# Patient Record
Sex: Female | Born: 1950
Health system: Southern US, Community
[De-identification: ages and names within clinical notes are randomized; demographics above are authoritative.]

## PROBLEM LIST (undated history)

## (undated) DIAGNOSIS — R011 Cardiac murmur, unspecified: Secondary | ICD-10-CM

## (undated) DIAGNOSIS — G8929 Other chronic pain: Secondary | ICD-10-CM

## (undated) DIAGNOSIS — M545 Low back pain, unspecified: Secondary | ICD-10-CM

## (undated) HISTORY — PX: COLONOSCOPY W/ BIOPSIES: SHX1374

## (undated) HISTORY — PX: POLYPECTOMY: SHX149

## (undated) HISTORY — PX: KNEE SURGERY: SHX244

## (undated) HISTORY — DX: Low back pain: M54.5

## (undated) HISTORY — PX: COLONOSCOPY: SHX174

## (undated) HISTORY — PX: ABLATION: SHX5711

## (undated) HISTORY — DX: Cardiac murmur, unspecified: R01.1

## (undated) HISTORY — DX: Low back pain, unspecified: M54.50

## (undated) HISTORY — DX: Other chronic pain: G89.29

---

## 1998-08-26 ENCOUNTER — Encounter: Admission: RE | Admit: 1998-08-26 | Discharge: 1998-08-26 | Payer: Self-pay | Admitting: Sports Medicine

## 1998-09-11 ENCOUNTER — Encounter: Admission: RE | Admit: 1998-09-11 | Discharge: 1998-09-11 | Payer: Self-pay | Admitting: Family Medicine

## 1999-10-06 ENCOUNTER — Encounter: Admission: RE | Admit: 1999-10-06 | Discharge: 1999-10-06 | Payer: Self-pay | Admitting: Sports Medicine

## 2001-02-22 ENCOUNTER — Encounter: Admission: RE | Admit: 2001-02-22 | Discharge: 2001-02-22 | Payer: Self-pay | Admitting: Sports Medicine

## 2001-04-28 ENCOUNTER — Other Ambulatory Visit: Admission: RE | Admit: 2001-04-28 | Discharge: 2001-04-28 | Payer: Self-pay | Admitting: *Deleted

## 2001-06-21 ENCOUNTER — Encounter: Admission: RE | Admit: 2001-06-21 | Discharge: 2001-06-21 | Payer: Self-pay | Admitting: Sports Medicine

## 2001-10-18 ENCOUNTER — Encounter: Admission: RE | Admit: 2001-10-18 | Discharge: 2001-10-18 | Payer: Self-pay | Admitting: Family Medicine

## 2001-10-28 ENCOUNTER — Encounter: Admission: RE | Admit: 2001-10-28 | Discharge: 2001-10-28 | Payer: Self-pay | Admitting: Sports Medicine

## 2002-05-19 ENCOUNTER — Other Ambulatory Visit: Admission: RE | Admit: 2002-05-19 | Discharge: 2002-05-19 | Payer: Self-pay | Admitting: Family Medicine

## 2003-02-16 ENCOUNTER — Encounter: Admission: RE | Admit: 2003-02-16 | Discharge: 2003-02-16 | Payer: Self-pay | Admitting: Sports Medicine

## 2003-03-30 ENCOUNTER — Encounter: Admission: RE | Admit: 2003-03-30 | Discharge: 2003-03-30 | Payer: Self-pay | Admitting: Sports Medicine

## 2003-07-10 ENCOUNTER — Other Ambulatory Visit: Admission: RE | Admit: 2003-07-10 | Discharge: 2003-07-10 | Payer: Self-pay | Admitting: Family Medicine

## 2003-10-22 ENCOUNTER — Ambulatory Visit (HOSPITAL_COMMUNITY): Admission: RE | Admit: 2003-10-22 | Discharge: 2003-10-22 | Payer: Self-pay | Admitting: Gastroenterology

## 2004-02-29 ENCOUNTER — Ambulatory Visit: Payer: Self-pay | Admitting: Sports Medicine

## 2004-12-02 ENCOUNTER — Ambulatory Visit: Payer: Self-pay | Admitting: Sports Medicine

## 2005-01-08 ENCOUNTER — Ambulatory Visit: Payer: Self-pay | Admitting: Sports Medicine

## 2005-04-10 ENCOUNTER — Ambulatory Visit: Payer: Self-pay | Admitting: Sports Medicine

## 2005-07-15 ENCOUNTER — Encounter: Admission: RE | Admit: 2005-07-15 | Discharge: 2005-07-15 | Payer: Self-pay | Admitting: General Surgery

## 2005-08-26 ENCOUNTER — Encounter: Admission: RE | Admit: 2005-08-26 | Discharge: 2005-08-26 | Payer: Self-pay | Admitting: General Surgery

## 2005-11-03 ENCOUNTER — Ambulatory Visit: Payer: Self-pay | Admitting: Sports Medicine

## 2006-01-06 ENCOUNTER — Encounter: Admission: RE | Admit: 2006-01-06 | Discharge: 2006-01-06 | Payer: Self-pay | Admitting: Family Medicine

## 2006-12-23 ENCOUNTER — Ambulatory Visit: Payer: Self-pay | Admitting: Sports Medicine

## 2006-12-23 DIAGNOSIS — IMO0002 Reserved for concepts with insufficient information to code with codable children: Secondary | ICD-10-CM

## 2006-12-23 DIAGNOSIS — D1800 Hemangioma unspecified site: Secondary | ICD-10-CM | POA: Insufficient documentation

## 2006-12-23 DIAGNOSIS — G4700 Insomnia, unspecified: Secondary | ICD-10-CM

## 2007-01-10 ENCOUNTER — Encounter: Admission: RE | Admit: 2007-01-10 | Discharge: 2007-01-10 | Payer: Self-pay | Admitting: Family Medicine

## 2008-01-12 ENCOUNTER — Encounter: Admission: RE | Admit: 2008-01-12 | Discharge: 2008-01-12 | Payer: Self-pay | Admitting: Family Medicine

## 2009-01-14 ENCOUNTER — Encounter: Admission: RE | Admit: 2009-01-14 | Discharge: 2009-01-14 | Payer: Self-pay | Admitting: Internal Medicine

## 2009-02-21 ENCOUNTER — Ambulatory Visit: Payer: Self-pay | Admitting: Sports Medicine

## 2009-02-21 DIAGNOSIS — S46819A Strain of other muscles, fascia and tendons at shoulder and upper arm level, unspecified arm, initial encounter: Secondary | ICD-10-CM

## 2009-04-04 ENCOUNTER — Ambulatory Visit: Payer: Self-pay | Admitting: Sports Medicine

## 2009-10-18 ENCOUNTER — Ambulatory Visit: Payer: Self-pay | Admitting: Sports Medicine

## 2009-10-18 DIAGNOSIS — M25559 Pain in unspecified hip: Secondary | ICD-10-CM | POA: Insufficient documentation

## 2009-10-18 DIAGNOSIS — M76899 Other specified enthesopathies of unspecified lower limb, excluding foot: Secondary | ICD-10-CM | POA: Insufficient documentation

## 2010-01-15 ENCOUNTER — Encounter: Admission: RE | Admit: 2010-01-15 | Discharge: 2010-01-15 | Payer: Self-pay | Admitting: Internal Medicine

## 2010-07-17 NOTE — Assessment & Plan Note (Signed)
Summary: BURSITIS OF THE HIP/MJD   Vital Signs:  Patient profile:   60 year old female BP sitting:   113 / 71  Vitals Entered By: Lillia Pauls CMA (Oct 18, 2009 9:51 AM)  History of Present Illness: 1 mo of RT hip area tightness biking a lot did great last year some sxs all winter but worse past mo - ? whether yoga poses bothered this with quad tightness  wakes her at night has to stretch does not hurt into groin  no running does lot of walking  feels better after yoga  Allergies (verified): 1)  ! Sulfa  Physical Exam  General:  Well-developed,well-nourished,in no acute distress; alert,appropriate and cooperative throughout examination Msk:  RT hip TTP over mid buttocks and piriformis TTP over glut med into upper troch midly wk on abduction and on hip rotation on RT norm ROM of hip joint RT and LT exc hip flex strength on RT Additional Exam:  MSK [place] There is a norm labrum Head of femur and femoral neck look normal no effusion bursal type swelling under glut medius tendon as it comes into sup Troch area this has inc doppler activity  images saved   Impression & Recommendations:  Problem # 1:  HIP PAIN, RIGHT (ICD-719.45)  Her updated medication list for this problem includes:    Diclofenac Sodium 75 Mg Tbec (Diclofenac sodium) .Marland Kitchen... 1 by mouth two times a day  use icing and NSAIDS can inject if more severe or meds not helping  Orders: [place] LIMITED ([id number])  Problem # 2:  TROCHANTERIC BURSITIS (ICD-726.5)  This is under glut medius tendon also some piriformis irritaiotn  HEP given stretches ck bike seat for cause  reck 4 wks  Orders: [place] LIMITED ([id number])  Complete Medication List: 1)  Amitriptyline Hcl 10 Mg Tabs (Amitriptyline hcl) .... Take 2 tabs at bedtime 2)  Amitriptyline Hcl 25 Mg Tabs (Amitriptyline hcl) .... Take 1 or 2 tablet by mouth at bedtime 3)  Diclofenac Sodium 75 Mg Tbec (Diclofenac sodium) .Marland Kitchen.. 1 by mouth  bid Prescriptions: DICLOFENAC SODIUM 75 MG TBEC (DICLOFENAC SODIUM) 1 by mouth bid  #50 x 2   Entered and Authorized by:   Enid Baas MD   Signed by:   Enid Baas MD on 10/18/2009   Method used:   Electronically to        [facility] # [phone]* (retail)       [address]       [place], [place]  [address]       Ph: [phone]       Fax: [phone]   RxID:   [id number]

## 2010-08-24 ENCOUNTER — Encounter: Payer: Self-pay | Admitting: *Deleted

## 2010-10-31 NOTE — Op Note (Signed)
NAMEKIMIKO, COMMON                          ACCOUNT NO.:  [id number]   MEDICAL RECORD NO.:  [id number]                   PATIENT TYPE:  AMB   LOCATION:  ENDO                                 FACILITY:  [facility]   PHYSICIAN:  Anselmo Rod, M.D.               DATE OF BIRTH:  [birth year]   DATE OF PROCEDURE:  10/22/2003  DATE OF DISCHARGE:                                 OPERATIVE REPORT   PROCEDURE PERFORMED:  Screening colonoscopy.   ENDOSCOPIST:  Charna Elizabeth, M.D.   INSTRUMENT USED:  Olympus video colonoscope.   INDICATIONS FOR PROCEDURE:  The patient is a 60 year old white female with a  family history of colon cancer undergoing screening colonoscopy.  Rule out  colonic polyps, masses, etc.   PREPROCEDURE PREPARATION:  Informed consent was procured from the patient.  The patient was fasted for eight hours prior to the procedure and prepped  with a bottle of magnesium citrate and a gallon of GoLYTELY the night prior  to the procedure.   PREPROCEDURE PHYSICAL:  The patient had stable vital signs.  Neck supple.  Chest clear to auscultation.  S1 and S2 regular.  Abdomen soft with normal  bowel sounds.   DESCRIPTION OF PROCEDURE:  The patient was placed in left lateral decubitus  position and sedated with 60 mg of Demerol and 6 mg of Versed intravenously.  Once the patient was adequately sedated and maintained on low flow oxygen  and continuous cardiac monitoring, the Olympus video colonoscope was  advanced from the rectum to the cecum.  The appendicular orifice and  ileocecal valve were clearly visualized and photographed.  No masses,  polyps, erosions, ulcerations or diverticula were seen.  The patient  tolerated the procedure well without immediate complications.   IMPRESSION:  Normal colonoscopy to the cecum.   RECOMMENDATIONS:  1. Repeat colorectal cancer screening is recommended in the next five years     unless the patient develops any abnormal symptoms in the interim.  2. Outpatient followup as need arises in the future.                                               Anselmo Rod, M.D.    JNM/MEDQ  D:  10/22/2003  T:  10/22/2003  Job:  [id number]   cc:   Talmadge Coventry, M.D.  [address]  [place]  [place] [address]  Fax: [phone]

## 2010-12-18 ENCOUNTER — Other Ambulatory Visit: Payer: Self-pay | Admitting: Family Medicine

## 2010-12-18 DIAGNOSIS — Z1231 Encounter for screening mammogram for malignant neoplasm of breast: Secondary | ICD-10-CM

## 2011-02-04 ENCOUNTER — Ambulatory Visit
Admission: RE | Admit: 2011-02-04 | Discharge: 2011-02-04 | Disposition: A | Discharge status: Home / Self Care | Payer: BC Managed Care – PPO | Source: Ambulatory Visit | Attending: Family Medicine | Admitting: Family Medicine

## 2011-02-04 DIAGNOSIS — Z1231 Encounter for screening mammogram for malignant neoplasm of breast: Secondary | ICD-10-CM

## 2011-04-13 ENCOUNTER — Encounter: Payer: Self-pay | Admitting: Pulmonary Disease

## 2011-04-13 ENCOUNTER — Ambulatory Visit: Payer: Self-pay | Admitting: Pulmonary Disease

## 2011-04-13 ENCOUNTER — Ambulatory Visit (INDEPENDENT_AMBULATORY_CARE_PROVIDER_SITE_OTHER): Payer: BC Managed Care – PPO | Admitting: Pulmonary Disease

## 2011-04-13 VITALS — BP 120/72 | HR 57 | Temp 97.7°F | Ht 65.0 in | Wt 134.0 lb

## 2011-04-13 DIAGNOSIS — G4700 Insomnia, unspecified: Secondary | ICD-10-CM

## 2011-04-13 MED ORDER — TRAZODONE HCL 50 MG PO TABS: 50.0000 mg | ORAL_TABLET | Freq: Every day | ORAL | Status: AC

## 2011-04-13 NOTE — Patient Instructions (Signed)
Stop amitriptyline Trial of trazodone 50mg  right at bedtime Melatonin 3mg  about 3 hrs before bedtime. Try moving bedtime to , then can move to earlier time in increments if doing well. Do not stay in bed more than 20-75min if you cannot re-initiate sleep once awake.  Leave bedroom until you are sleepy enough to return No computer after 10pm Never read or watch tv in bed. Please call me in 3 weeks to give me feedback.

## 2011-04-13 NOTE — Progress Notes (Signed)
  Subjective:    Patient ID: Nicole Benson, female    DOB: 1950-07-08, 60 y.o.   MRN: [id number]  HPI The patient is a 60 year old female whom I have been asked to see for chronic sleep issues.  The patient states that she has had difficulty with sleep maintenance since age 36, when she had her first child.  Since that time, she will typically go to bed between 9:30 and 10:30 PM, will sleep 4.5 to 5 hours, and we'll frequently awaken around 3 AM with an inability to return to sleep.  She is unsure why she awakens, and is unable to identify why she has issues going back to sleep.  She is currently sleeping in a different bedroom from her husband, and will often read in bed.  She typically takes 1-2 hours to return to sleep, and will arise at 7 AM to start her day.  She would prefer to awaken at 5:30 AM.  The patient denies any history of snoring, leg kicks, or symptoms consistent with the restless leg syndrome.  She has taken amitriptyline at bedtime on occasions, and will sometimes help her sleep more consistently.  The patient usually drinks one cup of coffee in the mornings, but rarely takes caffeine unless very tired after lunch.  She will sometimes nap in the late afternoons.  She exercises on a regular basis, but usually not within 3 hours of bedtime.   Review of Systems  Constitutional: Negative for fever and unexpected weight change.  HENT: Negative for ear pain, nosebleeds, congestion, sore throat, rhinorrhea, sneezing, trouble swallowing, dental problem, postnasal drip and sinus pressure.   Eyes: Negative for redness and itching.  Respiratory: Negative for cough, chest tightness, shortness of breath and wheezing.   Cardiovascular: Negative for palpitations and leg swelling.  Gastrointestinal: Negative for nausea and vomiting.  Genitourinary: Negative for dysuria.  Musculoskeletal: Negative for joint swelling.  Skin: Negative for rash.  Neurological: Negative for headaches.  Hematological:  Does not bruise/bleed easily.  Psychiatric/Behavioral: Negative for dysphoric mood. The patient is not nervous/anxious.        Objective:   Physical Exam Constitutional:  Well developed, no acute distress  HENT:  Nares patent without discharge, but narrowed bilat.                     Oropharynx without exudate, palate and uvula are normal  Eyes:  Perrla, eomi, no scleral icterus  Neck:  No JVD, no TMG  Cardiovascular:  Normal rate, regular rhythm, no rubs or gallops.  No murmurs        Intact distal pulses  Pulmonary :  Normal breath sounds, no stridor or respiratory distress   No rales, rhonchi, or wheezing  Abdominal:  Soft, nondistended, bowel sounds present.  No tenderness noted.   Musculoskeletal:  No lower extremity edema noted.  Lymph Nodes:  No cervical lymphadenopathy noted  Skin:  No cyanosis noted  Neurologic:  Alert, appropriate, moves all 4 extremities without obvious deficit.         Assessment & Plan:

## 2011-04-13 NOTE — Assessment & Plan Note (Signed)
The patient's history is most consistent with a psychophysiologic or learned insomnia.  Unfortunately, this has been going on for 25 years, and therefore will be a difficult process to correct.  I have explained to her the mainstay of treatment is behavioral in nature, but I will often use sleep aids the first few weeks in order to reset her cycle.  I have outlined for her both stimulus control therapy, as well as sleep restriction therapy.  She will give both of these a try.  I have also explained to her that she cannot read or watch TV in bed, and that she has to avoid daytime napping at all cost.  I do not think that she has a sleep disorder such as sleep disordered breathing or a primary movement disorder of sleep.

## 2011-05-14 ENCOUNTER — Encounter: Payer: Self-pay | Admitting: Adult Health

## 2011-05-14 ENCOUNTER — Ambulatory Visit: Payer: BC Managed Care – PPO | Admitting: Pulmonary Disease

## 2011-05-14 ENCOUNTER — Ambulatory Visit (INDEPENDENT_AMBULATORY_CARE_PROVIDER_SITE_OTHER): Payer: BC Managed Care – PPO | Admitting: Adult Health

## 2011-05-14 DIAGNOSIS — G4700 Insomnia, unspecified: Secondary | ICD-10-CM

## 2011-05-14 MED ORDER — TRAZODONE HCL 50 MG PO TABS: 50.0000 mg | ORAL_TABLET | Freq: Every day | ORAL | Status: AC

## 2011-05-14 NOTE — Progress Notes (Signed)
  Subjective:    Patient ID: Nicole Benson, female    DOB: February 01, 1951, 60 y.o.   MRN: [id number]  HPI 60 yo female seen for initial pulmonary consult 04/13/11 by Dr. Shelle Iron for chronic insomnia   05/14/2011 Follow up  Pt returns for follow up today . Seen 1 month ago for initial consult for chronic insomnia for 20+ years . Last ov changed elavil to trazodone along with healthy sleep regimen.  She returns for follow up , initally trazodone helped but developed dry eyes with cold weather /heat inside, achy bone/back and house too cold to get up to read when she wakes up . Over last 2 weeks sleep has been hit or miss with 2 hr - 6 hrs but no consistent sleep.  We discussed the healthy sleep regimen along with retraining " behavioral " training  She is very motivatated to fix this. She is health conscious with yoga daily and good nutrition.  No restless leg symtpoms. Does wonder about hormones/menopause issues.    Review of Systems Constitutional:   No  weight loss, night sweats,  Fevers, chills, fatigue, or  lassitude.  HEENT:   No headaches,  Difficulty swallowing,  Tooth/dental problems, or  Sore throat,                No sneezing, itching, ear ache, nasal congestion, post nasal drip,   CV:  No chest pain,  Orthopnea, PND, swelling in lower extremities, anasarca, dizziness, palpitations, syncope.   GI  No heartburn, indigestion, abdominal pain, nausea, vomiting, diarrhea, change in bowel habits, loss of appetite, bloody stools.   Resp: No shortness of breath with exertion or at rest.  No excess mucus, no productive cough,  No non-productive cough,  No coughing up of blood.  No change in color of mucus.  No wheezing.  No chest wall deformity  Skin: no rash or lesions.  GU: no dysuria, change in color of urine, no urgency or frequency.  No flank pain, no hematuria   MS:  No joint   swelling.  No decreased range of motion.     Psych:  No change in mood or affect. No depression or anxiety.   No memory loss.         Objective:   Physical Exam GEN: A/Ox3; pleasant , NAD, well nourished   HEENT:  [place]/AT,  EACs-clear, TMs-wnl, NOSE-clear, THROAT-clear, no lesions, no postnasal drip or exudate noted.   NECK:  Supple w/ fair ROM; no JVD; normal carotid impulses w/o bruits; no thyromegaly or nodules palpated; no lymphadenopathy.  RESP  Clear  P & A; w/o, wheezes/ rales/ or rhonchi.no accessory muscle use, no dullness to percussion  CARD:  RRR, no m/r/g  , no peripheral edema, pulses intact, no cyanosis or clubbing.  GI:   Soft & nt; nml bowel sounds; no organomegaly or masses detected.  Musco: Warm bil, no deformities or joint swelling noted.   Neuro: alert, no focal deficits noted.    Skin: Warm, no lesions or rashes         Assessment & Plan:

## 2011-05-14 NOTE — Patient Instructions (Signed)
May use Trazodone 50mg   at bedtime As needed  Insomnia  Melatonin 3mg  about 3 hrs before bedtime for insomnia  Try moving bedtime to 11p- 12a , then can move to earlier time in increments if doing well. Do not stay in bed more than 20-28min if you cannot re-initiate sleep once awake.  Leave bedroom until you are sleepy enough to return No computer after 10pm Never read or watch tv in bed. follow up Dr. Shelle Iron in 4 weeks and As needed

## 2011-05-14 NOTE — Assessment & Plan Note (Signed)
Went over sleep stratigies - she is motivated to try regimen again along with fixing some of her issues with dry eyes, joint stiffness, etc.  Plan:  May use Trazodone 50mg   at bedtime As needed  Insomnia  Melatonin 3mg  about 3 hrs before bedtime for insomnia  Try moving bedtime to 11p- 12a , then can move to earlier time in increments if doing well. Do not stay in bed more than 20-18min if you cannot re-initiate sleep once awake.  Leave bedroom until you are sleepy enough to return No computer after 10pm Never read or watch tv in bed. follow up Dr. Shelle Iron in 4 weeks and As needed

## 2011-05-19 NOTE — Progress Notes (Signed)
Ov reviewed, and agree with plan as outlined.  

## 2011-12-29 ENCOUNTER — Other Ambulatory Visit: Payer: Self-pay | Admitting: Family Medicine

## 2011-12-29 DIAGNOSIS — Z1231 Encounter for screening mammogram for malignant neoplasm of breast: Secondary | ICD-10-CM

## 2012-02-01 ENCOUNTER — Ambulatory Visit (INDEPENDENT_AMBULATORY_CARE_PROVIDER_SITE_OTHER): Payer: BC Managed Care – PPO | Admitting: Sports Medicine

## 2012-02-01 VITALS — BP 90/60 | Ht 64.5 in | Wt 125.0 lb

## 2012-02-01 DIAGNOSIS — M25569 Pain in unspecified knee: Secondary | ICD-10-CM

## 2012-02-01 DIAGNOSIS — M7120 Synovial cyst of popliteal space [Baker], unspecified knee: Secondary | ICD-10-CM

## 2012-02-01 NOTE — Progress Notes (Signed)
  Subjective:    Patient ID: Nicole Benson, female    DOB: 03-Nov-1950, 61 y.o.   MRN: [id number]  HPI  76 yof with no significant PMH presents for right knee pain.  Patient works as a Administrator requiring walking up steep inclines.  Approximately three months ago she began feeling pain over the right medial aspect of the knee.  She denies any inciting injury or trauma to the area.  She has a history of a hip bursitis and she had recently decreased her activity, no longer doing yoga or cycling.  She felt like her deconditioning may have been contributing to her knee pain.  3 weeks ago she developed pain and swelling in the posterior aspect of the knee.  She has a history of a Baker's cyst in the remote past and felt that this was similar.  It is an aching sensation and a lump that increases in size throughout the day.  It is exacerbated by walking and prolonged standing.  She was surprised to find it actually improved after cycling and climbing.  She is icing occasionally and also wearing a hinge brace that she already had.  She was taking aleve, however noted blood in her stool and discontinued this medication.      Review of Systems  Denies fevers, chills, skin rashes.  No low back, hip, or foot pain.       Objective:   Physical Exam General - well appearing, nad Psych - appropriate mood and affect HEENT - ncat, mmm, EOMI Resp - respirations non-labored MS -  Right knee:  Normal alignment, normal gait.  No gross effusion, small fluid filled area in posterior aspect of knee consistent with Baker's cyst.  Mildly tender to palpation over medial joint line.  Full ROM without crepitus.  Knee flexion and extension strength 5/5.  Negative Lachman's, anterior drawer, posterior drawer.  No valgus or varus instability, although valgus stress causes pain over medial aspect of knee.  Negative McMurray's but with pain over medial aspect of knee.  Negative patellar grind test.         Assessment & Plan:    61 year old female presents with posterior and medial pain of the right knee, consistent with Baker's cyst and suspected medial meniscal tear.  1.  Body helix knee sleeve to be worn throughout the day. 2.  Given handout on open chain knee exercises.   3.  Continue activity as tolerated. 4.  Return to clinic if unimproved, for consideration of intraarticular injection.

## 2012-02-04 ENCOUNTER — Ambulatory Visit: Payer: BC Managed Care – PPO | Admitting: Sports Medicine

## 2012-02-05 ENCOUNTER — Encounter: Payer: Self-pay | Admitting: Family Medicine

## 2012-02-05 ENCOUNTER — Ambulatory Visit: Payer: BC Managed Care – PPO

## 2012-02-05 ENCOUNTER — Ambulatory Visit (INDEPENDENT_AMBULATORY_CARE_PROVIDER_SITE_OTHER): Payer: BC Managed Care – PPO | Admitting: Family Medicine

## 2012-02-05 VITALS — BP 104/65 | HR 55 | Temp 97.5°F | Resp 16 | Ht 65.0 in | Wt 130.0 lb

## 2012-02-05 DIAGNOSIS — Z13220 Encounter for screening for lipoid disorders: Secondary | ICD-10-CM

## 2012-02-05 DIAGNOSIS — Z0000 Encounter for general adult medical examination without abnormal findings: Secondary | ICD-10-CM

## 2012-02-05 DIAGNOSIS — Z23 Encounter for immunization: Secondary | ICD-10-CM

## 2012-02-05 LAB — COMPREHENSIVE METABOLIC PANEL
AST: 23 U/L (ref 0–37)
Alkaline Phosphatase: 51 U/L (ref 39–117)
BUN: 18 mg/dL (ref 6–23)
Creat: 0.85 mg/dL (ref 0.50–1.10)
Glucose, Bld: 88 mg/dL (ref 70–99)
Sodium: 141 mEq/L (ref 135–145)
Total Bilirubin: 1.2 mg/dL (ref 0.3–1.2)
Total Protein: 6.1 g/dL (ref 6.0–8.3)

## 2012-02-05 LAB — POCT URINALYSIS DIPSTICK
Bilirubin, UA: NEGATIVE
Blood, UA: NEGATIVE
Glucose, UA: NEGATIVE
Ketones, UA: NEGATIVE
Leukocytes, UA: NEGATIVE
Protein, UA: NEGATIVE
Spec Grav, UA: 1.02
Urobilinogen, UA: 0.2
pH, UA: 6

## 2012-02-05 LAB — LIPID PANEL
HDL: 71 mg/dL (ref >39)
Total CHOL/HDL Ratio: 3.3 Ratio
Triglycerides: 53 mg/dL (ref <150)

## 2012-02-05 NOTE — Patient Instructions (Signed)
Keeping You Healthy  Get These Tests  Blood Pressure- Have your blood pressure checked by your healthcare provider at least once a year.  Normal blood pressure is 120/80.  Weight- Have your body mass index (BMI) calculated to screen for obesity.  BMI is a measure of body fat based on height and weight.  You can calculate your own BMI at https://www.west-esparza.com/  Cholesterol- Have your cholesterol checked every year.  Diabetes- Have your blood sugar checked every year if you have high blood pressure, high cholesterol, a family history of diabetes or if you are overweight.  Pap Smear- Have a pap smear every 1 to 3 years if you have been sexually active.  If you are older than 65 and recent pap smears have been normal you may not need additional pap smears.  In addition, if you have had a hysterectomy  For benign disease additional pap smears are not necessary.  Mammogram-Yearly mammograms are essential for early detection of breast cancer  Screening for Colon Cancer- Colonoscopy starting at age 54. Screening may begin sooner depending on your family history and other health conditions.  Follow up colonoscopy as directed by your Gastroenterologist.  Screening for Osteoporosis- Screening begins at age 12 with bone density scanning, sooner if you are at higher risk for developing Osteoporosis.  Get these medicines  Calcium with Vitamin D- Your body requires 1200-1500 mg of Calcium a day and [phone] IU of Vitamin D a day.  You can only absorb 500 mg of Calcium at a time therefore Calcium must be taken in 2 or 3 separate doses throughout the day.  Hormones- Hormone therapy has been associated with increased risk for certain cancers and heart disease.  Talk to your healthcare provider about if you need relief from menopausal symptoms.  Aspirin- Ask your healthcare provider about taking Aspirin to prevent Heart Disease and Stroke.  Get these Immuniztions  Flu shot- Every fall  Pneumonia  shot- Once after the age of 92; if you are younger ask your healthcare provider if you need a pneumonia shot.  Tetanus- Every ten years.  You received T-DaP (one time dose) today.  Zostavax- Once after the age of 2 to prevent shingles. You have a prescription for Zostavax to be given by your local pharmacist.  Take these steps  Don't smoke- Your healthcare provider can help you quit. For tips on how to quit, ask your healthcare provider or go to www.smokefree.gov or call 1-800 QUIT-NOW.  Be physically active- Exercise 5 days a week for a minimum of 30 minutes.  If you are not already physically active, start slow and gradually work up to 30 minutes of moderate physical activity.  Try walking, dancing, bike riding, swimming, etc.  Eat a healthy diet- Eat a variety of healthy foods such as fruits, vegetables, whole grains, low fat milk, low fat cheeses, yogurt, lean meats, chicken, fish, eggs, dried beans, tofu, etc.  For more information go to www.thenutritionsource.org  Dental visit- Brush and floss teeth twice daily; visit your dentist twice a year.  Eye exam- Visit your Optometrist or Ophthalmologist yearly.  Drink alcohol in moderation- Limit alcohol intake to one drink or less a day.  Never drink and drive.  Depression- Your emotional health is as important as your physical health.  If you're feeling down or losing interest in things you normally enjoy, please talk to your healthcare provider.  Seat Belts- can save your life; always wear one  Smoke/Carbon Monoxide detectors- These detectors need to  be installed on the appropriate level of your home.  Replace batteries at least once a year.  Violence- If anyone is threatening or hurting you, please tell your healthcare provider.  Living Will/ Health care power of attorney- Discuss with your healthcare provider and family.    Shingles Shingles is caused by the same virus that causes chickenpox (varicella zoster virus or VZV).  Shingles often occurs many years or decades after having chickenpox. That is why it is more common in adults older than 50 years. The virus reactivates and breaks out as an infection in a nerve root. SYMPTOMS   The initial feeling (sensations) may be pain. This pain is usually described as:   Burning.   Stabbing.   Throbbing.   Tingling in the nerve root.   A red rash will follow in a couple days. The rash may occur in any area of the body and is usually on one side (unilateral) of the body in a band or belt-like pattern. The rash usually starts out as very small blisters (vesicles). They will dry up after 7 to 10 days. This is not usually a significant problem except for the pain it causes.   Long-lasting (chronic) pain is more likely in an elderly person. It can last months to years. This condition is called postherpetic neuralgia.  Shingles can be an extremely severe infection in someone with AIDS, a weakened immune system, or with forms of leukemia. It can also be severe if you are taking transplant medicines or other medicines that weaken the immune system. TREATMENT  Your caregiver will often treat you with:  Antiviral drugs.   Anti-inflammatory drugs.   Pain medicines.  Bed rest is very important in preventing the pain associated with herpes zoster (postherpetic neuralgia). Application of heat in the form of a hot water bottle or electric heating pad or gentle pressure with the hand is recommended to help with the pain or discomfort. PREVENTION  A varicella zoster vaccine is available to help protect against the virus. The Food and Drug Administration approved the varicella zoster vaccine for individuals 61 years of age and older. HOME CARE INSTRUCTIONS   Cool compresses to the area of rash may be helpful.   Only take over-the-counter or prescription medicines for pain, discomfort, or fever as directed by your caregiver.   Avoid contact with:   Babies.   Pregnant women.     Children with eczema.   Elderly people with transplants.   People with chronic illnesses, such as leukemia and AIDS.   If the area involved is on your face, you may receive a referral for follow-up to a specialist. It is very important to keep all follow-up appointments. This will help avoid eye complications, chronic pain, or disability.  SEEK IMMEDIATE MEDICAL CARE IF:   You develop any pain (headache) in the area of the face or eye. This must be followed carefully by your caregiver or ophthalmologist. An infection in part of your eye (cornea) can be very serious. It could lead to blindness.   You do not have pain relief from prescribed medicines.   Your redness or swelling spreads.   The area involved becomes very swollen and painful.   You have a fever.   You notice any red or painful lines extending away from the affected area toward your heart (lymphangitis).   Your condition is worsening or has changed.  Document Released: 06/01/2005 Document Revised: 05/21/2011 Document Reviewed: 05/06/2009 [facility] Patient Information 2012 [place], [place].

## 2012-02-08 ENCOUNTER — Ambulatory Visit
Admission: RE | Admit: 2012-02-08 | Discharge: 2012-02-08 | Disposition: A | Discharge status: Home / Self Care | Payer: BC Managed Care – PPO | Source: Ambulatory Visit | Attending: Family Medicine | Admitting: Family Medicine

## 2012-02-08 DIAGNOSIS — Z1231 Encounter for screening mammogram for malignant neoplasm of breast: Secondary | ICD-10-CM

## 2012-02-10 ENCOUNTER — Encounter: Payer: Self-pay | Admitting: Family Medicine

## 2012-02-10 NOTE — Progress Notes (Signed)
Quick Note:  Please call pt and advise that the following labs are abnormal... Total cholesterol and LDL("bad") cholesterol are above normal. Continue your healthy lifestyle choices and get OTC Fish Oil (like Schiff MegaRed Omega-3 Krill Oil capsules) and take 1 cap daily. It would be a good idea to recheck these values in ~6 months.  Your chemistries are all normal. Take care!  Copy to pt.  ______

## 2012-02-10 NOTE — Progress Notes (Signed)
  Subjective:    Patient ID: Nicole Benson, female    DOB: [birth year], 61 y.o.   MRN: [id number]  HPI  This 61 y.o. Cauc female is here for CPE/PAP.  She takes no chronic prescription meds. 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 She is married, is a   nonsmoker and has 1-2 glasses of wine with dinner 4x /wk.   Last PAP: 2011 (normal)  Last MMG: 2012 (normal)  Last Colonoscopy: ~5 years ago per pt hx   Review of Systems Noncontributory/ negative     Objective:   Physical Exam  Nursing note and vitals reviewed. Constitutional: She is oriented to person, place, and time. She appears well-developed and well-nourished. No distress.  HENT:  Head: Normocephalic and atraumatic.  Right Ear: Hearing, tympanic membrane, external ear and ear canal normal.  Left Ear: Hearing, tympanic membrane and ear canal normal.  Nose: Nose normal. No nasal deformity or septal deviation.  Mouth/Throat: Uvula is midline, oropharynx is clear and moist and mucous membranes are normal. No oral lesions. Normal dentition. No dental caries.  Eyes: Conjunctivae and EOM are normal. Pupils are equal, round, and reactive to light. No scleral icterus.  Neck: Normal range of motion. Neck supple. No thyromegaly present.  Cardiovascular: Normal rate, regular rhythm, normal heart sounds and intact distal pulses.  Exam reveals no gallop and no friction rub.   No murmur heard. Pulmonary/Chest: Effort normal and breath sounds normal. No respiratory distress. Right breast exhibits no inverted nipple, no mass, no nipple discharge, no skin change and no tenderness. Left breast exhibits no inverted nipple, no mass, no nipple discharge, no skin change and no tenderness. Breasts are symmetrical.  Abdominal: Soft. Bowel sounds are normal. She exhibits no abdominal bruit and no mass. There is no hepatosplenomegaly. There is no tenderness. There is no guarding and no CVA tenderness.  Genitourinary:  Rectum normal. Rectal exam shows no external hemorrhoid, no fissure, no mass, no tenderness and anal tone normal. Guaiac negative stool.       PAP/ GYN exam deferred  Musculoskeletal: Normal range of motion. She exhibits no edema and no tenderness.  Lymphadenopathy:    She has no cervical adenopathy.  Neurological: She is alert and oriented to person, place, and time. No cranial nerve deficit. She exhibits normal muscle tone. Coordination normal.  Skin: Skin is warm and dry. No erythema. No pallor.  Psychiatric: She has a normal mood and affect. Her behavior is normal. Judgment and thought content normal.          Assessment & Plan:   1. Routine general medical examination at a health care facility  Comprehensive metabolic panel, POCT urinalysis dipstick, IFOBT POC (occult bld, rslt in office) Continue current healthy lifestyle choices  2. Screening for hyperlipidemia  Lipid panel  3. Need for prophylactic vaccination with combined diphtheria-tetanus-pertussis (DTP) vaccine  Tdap vaccine greater than or equal to 7yo IM

## 2012-04-07 ENCOUNTER — Ambulatory Visit (INDEPENDENT_AMBULATORY_CARE_PROVIDER_SITE_OTHER): Payer: BC Managed Care – PPO | Admitting: Sports Medicine

## 2012-04-07 VITALS — BP 110/66 | Ht 65.0 in | Wt 125.0 lb

## 2012-04-07 DIAGNOSIS — M545 Low back pain: Secondary | ICD-10-CM

## 2012-04-08 NOTE — Progress Notes (Signed)
  Subjective:    Patient ID: Nicole Benson, female    DOB: [birth year], 61 y.o.   MRN: [id number]  HPI chief complaint: Low back pain  Patient comes in today complaining of several weeks of right-sided low back pain. She describes a "pulling" sensation that begins in the lower lumbar spine and radiates at times up in the thoracic spine. It seems to be worse first thing in the morning. 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 She has noticed that moist heat and massage are helpful but have not been curative. She denies any groin. No associated numbness or tingling. No radiating pain into her legs. No prior low back surgery.    Review of Systems     Objective:   Physical Exam Well-developed, well-nourished. No acute distress. Awake alert and oriented x3  She has full lumbar mobility. Pain with forward flexion, no pain with extension. Slight tenderness to palpation along the erector spinae muscle on the right. Slight spasm as well. No tenderness to palpation or percussion along the lumbar midline. Negative straight leg raise bilaterally. Reflexes are brisk and equal at the Achilles and patellar tendons bilaterally. Strength is 5/5 both lower extremities. She walks without a significant limp.       Assessment & Plan:  1. Low back pain likely secondary to lumbar strain  I would like for her to continue with moist heat and massage. I would like to refer her to [facility] for formal physical therapy. I think she will quickly wean to home exercise program. I've given her a few McKenzie exercises to do at home while waiting on her first physical therapy appointment. She can resume activity as tolerated. She'll followup in 3-4 weeks if symptoms persist.

## 2012-04-27 ENCOUNTER — Other Ambulatory Visit: Payer: Self-pay | Admitting: *Deleted

## 2012-04-27 MED ORDER — MELOXICAM 15 MG PO TABS: ORAL_TABLET | ORAL | Status: DC

## 2012-04-27 NOTE — Progress Notes (Signed)
Prescribed per Dr. Margaretha Sheffield.  Recommended to start an anti inflammatory per Sharen Hones.

## 2012-05-02 ENCOUNTER — Ambulatory Visit: Payer: BC Managed Care – PPO | Admitting: Sports Medicine

## 2012-05-09 ENCOUNTER — Encounter: Payer: Self-pay | Admitting: Sports Medicine

## 2012-05-09 ENCOUNTER — Ambulatory Visit (INDEPENDENT_AMBULATORY_CARE_PROVIDER_SITE_OTHER): Payer: BC Managed Care – PPO | Admitting: Sports Medicine

## 2012-05-09 VITALS — BP 135/89 | HR 59 | Ht 65.0 in | Wt 125.0 lb

## 2012-05-09 DIAGNOSIS — S335XXA Sprain of ligaments of lumbar spine, initial encounter: Secondary | ICD-10-CM

## 2012-05-09 DIAGNOSIS — S39012A Strain of muscle, fascia and tendon of lower back, initial encounter: Secondary | ICD-10-CM

## 2012-05-09 NOTE — Progress Notes (Signed)
  Subjective:    Patient ID: Nicole Benson, female    DOB: 1950-09-08, 61 y.o.   MRN: [id number]  HPI chief complaint: Low back pain  Patient comes in today for follow up of right-sided low back pain. Patient states pain has completely resolved with physical therapy (she has now completed her course). She is thrilled with results. Patient now has full range of motion without pain. She is able to participate in Yoga completely again as well as some row bike/mountain biking.    Has several questions about prevention/future injuries. States she is concerned about unilateral stretching with yoga which she did after she had a meniscus injury (encouraged patient to only do exercises that she can do bilaterally). Patient is doing leg extensions to strengthen right knee and wonders if there are other exercises she can do. She is also concerned about developing Baker's cysts which she has had in the past and wants to know how to prevent these as well as if she can use Meloxicam for dull ache associated with the Baker's cyst when they occur.   Review of Systems-see HPI     Objective:   Physical Exam Well-developed, well-nourished. No acute distress. Awake alert and oriented x3   Back - Normal skin, Spine with normal alignment and no deformity. She has full lumbar mobility without pain No tenderness to vertebral process palpation.  Paraspinous muscles are not tender and without spasm.   Range of motion is full at neck and lumbar sacral regions  Negative straight leg raise bilaterally. Reflexes are brisk and equal at the Achilles and patellar tendons bilaterally. Strength is 5/5 both lower extremities. No limp on gait.   Right knee:  Normal alignment, normal gait.  No gross effusion or Baker's cyst.  No pain to palpation. Full ROM without crepitus.  Knee flexion and extension strength 5/5.  Negative Lachman's, anterior drawer, posterior drawer.  No valgus or varus instability or pain with stressing. Negative  McMurray's.      Assessment & Plan:  1. Right sided Low back pain likely secondary to lumbar strain 2. Right sided Meniscus Injury  Pain has resolved. Given that spasm seemed to occur secondary to unilateral yoga exercises we have discouraged future unilateral exercises. Told patient she is welcome to continue to work with Nicole Benson for formal physical therapy in the future if has similar back spasm, but for new issue should likely be evaluated first.   Meniscus injury appears to continue to heal-No pain/stable joint.  Encouraged regular quad/hamstring strengthening exercises (handout given). Discussed no absolute prevention for Bakers cyst but that she can use Meloxicam if needed in future for dull ache associated with Baker's cyst.  F/U prn

## 2012-10-24 ENCOUNTER — Ambulatory Visit
Admission: RE | Admit: 2012-10-24 | Discharge: 2012-10-24 | Disposition: A | Discharge status: Home / Self Care | Payer: BC Managed Care – PPO | Source: Ambulatory Visit | Attending: Sports Medicine | Admitting: Sports Medicine

## 2012-10-24 ENCOUNTER — Ambulatory Visit (INDEPENDENT_AMBULATORY_CARE_PROVIDER_SITE_OTHER): Payer: BC Managed Care – PPO | Admitting: Sports Medicine

## 2012-10-24 ENCOUNTER — Encounter: Payer: Self-pay | Admitting: Sports Medicine

## 2012-10-24 VITALS — BP 122/82 | HR 49 | Ht 65.0 in | Wt 125.0 lb

## 2012-10-24 DIAGNOSIS — M25569 Pain in unspecified knee: Secondary | ICD-10-CM

## 2012-10-24 DIAGNOSIS — M25562 Pain in left knee: Secondary | ICD-10-CM

## 2012-10-24 DIAGNOSIS — M25561 Pain in right knee: Secondary | ICD-10-CM

## 2012-10-24 MED ORDER — METHYLPREDNISOLONE ACETATE 40 MG/ML IJ SUSP
40.0000 mg | Freq: Once | INTRAMUSCULAR | Status: AC
  Administered 2012-10-24: 40 mg via INTRA_ARTICULAR

## 2012-10-24 NOTE — Progress Notes (Addendum)
  Subjective:    Patient ID: Nicole Benson, female    DOB: Mar 17, 1951, 62 y.o.   MRN: [id number]  HPI chief complaint: Bilateral knee pain  Patient comes in today complaining of bilateral knee pain. She was treated several months ago for a medial meniscal injury in the right knee with a body helix compression sleeve and a home exercise program. Her symptoms resolved but about 6-8 weeks ago began to return. She notices intermittent aching along the medial knee with some tightness and stiffness in the posterior knee. 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 She denies any noticeable swelling in the knee. She has had a Baker's cyst in the past. She denies locking or catching. She is beginning to experience similar symptoms in the left knee. No significant pain at rest. He has been wearing her body helix sleeve intermittently. She is an avid cyclist and has not noticed too much discomfort with riding. No recent trauma. She is here today with her husband.  Her medical history is unchanged    Review of Systems     Objective:   Physical Exam Well-developed, fit-appearing. No acute distress. Awake alert and oriented x3  Right knee: Full range of motion. No effusion. No fullness in the popliteal fossa. No significant patellofemoral crepitus. She is tender to palpation along the medial joint line with pain but no popping with McMurray's. Pain with Thessaly's as well. No tenderness along the lateral joint line. Good ligamentous stability. Neurovascularly intact distally.  Left knee: Full range of motion. No effusion. Slight tenderness to palpation along the medial joint line but no pain with McMurray's or Thessaly's. No significant patellofemoral crepitus. Knee is stable to ligamentous exam. Neurovascularly intact distally.  Walking without a limp.  MSK ultrasound of the more symptomatic right knee: There is a trace effusion. No obvious Baker's cyst.  Visualized portion of the medial meniscus does not show any obvious tear but there is some extrusion from the joint line with some surrounding edema.       Assessment & Plan:  1. Returning right knee pain likely secondary to degenerative medial meniscal injury  I would like to get x-rays of both knees. I recommended a cortisone injection for the more symptomatic right knee. I think she can continue with activity as tolerated including cycling but I have recommended that she use a body helix compression sleeve one hour after activity. She will ice intermittently as needed as well and will followup with me in 4 weeks. Resume open chain knee exercises. I think her left knee pain is compensatory and will hopefully resolve as her right knee pain improves. She and her husband are encouraged to call with questions or concerns in the interim.  Consent obtained and verified. Time-out conducted. Noted no overlying erythema, induration, or other signs of local infection. Skin prepped in a sterile fashion. Topical analgesic spray: Ethyl chloride. Joint: right knee/ anterior lateral approach Needle: 25g 1 1/2 inch Completed without difficulty. Meds: 3cc 1% xylocaine, 1cc (40mg ) depomedrol  Advised to call if fevers/chills, erythema, induration, drainage, or persistent bleeding.

## 2012-10-25 ENCOUNTER — Telehealth: Payer: Self-pay | Admitting: Sports Medicine

## 2012-10-25 NOTE — Telephone Encounter (Signed)
I spoke with Ms. Tierney on the phone today regarding x-rays of her knees. She has some mild degenerative changes in the right knee and minimal degenerative changes in the left. Her right knee is feeling much better after yesterday's cortisone injection. She will followup with me in 4 weeks as scheduled and will let me know if she has problems in the interim.

## 2012-11-21 ENCOUNTER — Ambulatory Visit: Payer: BC Managed Care – PPO | Admitting: Sports Medicine

## 2012-11-23 ENCOUNTER — Encounter: Payer: Self-pay | Admitting: Sports Medicine

## 2012-11-23 ENCOUNTER — Ambulatory Visit (INDEPENDENT_AMBULATORY_CARE_PROVIDER_SITE_OTHER): Payer: BC Managed Care – PPO | Admitting: Sports Medicine

## 2012-11-23 VITALS — BP 105/66 | HR 56 | Ht 65.0 in | Wt 125.0 lb

## 2012-11-23 DIAGNOSIS — M7050 Other bursitis of knee, unspecified knee: Secondary | ICD-10-CM

## 2012-11-23 DIAGNOSIS — IMO0002 Reserved for concepts with insufficient information to code with codable children: Secondary | ICD-10-CM

## 2012-11-23 NOTE — Progress Notes (Signed)
  Subjective:    Patient ID: Nicole Benson, female    DOB: Jan 18, 1951, 62 y.o.   MRN: [id number]  HPI Patient comes in today for followup on right knee pain. Cortisone injection 4 weeks ago helped for about 3 weeks. Pain has now returned but it is in a different location. She now localizes all of her pain to the pes anserine bursa as well as along the medial hamstring tendons. She has not noticed any swelling. She's not getting any mechanical symptoms. She is wearing her body helix compression sleeve which seems to be helpful.     Review of Systems     Objective:   Physical Exam Well-developed, well-nourished. No acute distress. Awake alert and oriented x3  Right knee: Full range of motion. No effusion. There is tenderness to palpation directly over the pes anserine bursa. No significant soft tissue swelling. No tenderness along the medial joint line and a negative McMurray's. Neurovascularly intact distally. Walking without significant limp.  X-rays of the right knee show some mild degenerative changes but nothing marked.       Assessment & Plan:  1. Right knee pain secondary to pes anserine bursitis  I believe the patient's initial symptoms were likely due to her mild osteoarthritis. That pain resolved with a cortisone injection and I think today's pain is more consistent with pen anserine bursitis. Given her a few exercises to do but she will see Oscar La' Halloran next week. I think she would benefit from some iontophoresis to this area. She will use ice intermittently and continue with her body helix compression sleeve. Interestingly enough, she has measured the seat height on her bicycles and has noticed that a couple of the bikes may need to have their seat adjusted up about an inch. This subtle change may make a big difference. Followup in 6 weeks.

## 2012-12-14 ENCOUNTER — Telehealth: Payer: Self-pay | Admitting: Sports Medicine

## 2012-12-14 NOTE — Telephone Encounter (Signed)
Message copied by Ralene Cork on Wed Dec 14, 2012  5:00 PM ------      Message from: CERESI, MELANIE L      Created: Wed Dec 14, 2012  2:28 PM      Regarding: FW: phone message      Contact: [phone]       I just spoke to Jeidy, I explained to her due to HIPPA law's you will need to speak to her.  She said the number above is the best one to call her at.       ----- Message -----         From: Lizbeth Bark         Sent: 12/14/2012   2:06 PM           To: Ralene Cork, DO      Subject: RE: phone message                                        I didn't see one and I did mention that we would need to speak to her.  This is pt phone # [phone].      ----- Message -----         From: Ralene Cork, DO         Sent: 12/14/2012   1:58 PM           To: Lizbeth Bark      Subject: FW: phone message                                        Did patient sign a HIPPA form giving [place] permission to discuss her case with her husband?            ----- Message -----         From: Lillia Pauls, CMA         Sent: 12/14/2012  12:06 PM           To: Ralene Cork, DO      Subject: FW: phone message                                                    ----- Message -----         From: Lizbeth Bark         Sent: 12/14/2012   8:57 AM           To: Lillia Pauls, CMA      Subject: phone message                                            Pt husband would like to discuss note that PT sent to Dr. Margaretha Sheffield as well as maybe getting pt an MRI of rt knee.                   ------

## 2012-12-14 NOTE — Telephone Encounter (Signed)
This is a clarification of the phone note dictated earlier today. I spoke with the patient on the phone regarding persistent right knee pain. Although she had had some initial success with a cortisone injection and physical therapy her symptoms have now returned. She is getting pain and now beginning to experience mechanical symptoms with certain activities such as twisting and changing direction while walking. Therefore, I think we should proceed with an MRI scan to rule out a meniscal tear which may need surgical debridement. I will followup with her on the telephone with those results once available.

## 2012-12-14 NOTE — Telephone Encounter (Signed)
I spoke with the patient on the phone today regarding her right knee. She is still having persistent pain despite conservative treatment to date. She has a biking trip scheduled for the very first of September. She is still getting can all symptoms with walking and twisting. My concern is that she may have a meniscal tear which may need surgical debridement. Therefore, I recommend that we proceed with an MRI scan to evaluate further. She has a followup appointment with me in 2 weeks but I will plan on calling her with those results in the interim once available.

## 2012-12-14 NOTE — Telephone Encounter (Signed)
Message copied by Ralene Cork on Wed Dec 14, 2012  4:54 PM ------      Message from: CERESI, Shawna Orleans L      Created: Wed Dec 14, 2012  2:28 PM      Regarding: FW: phone message      Contact: [phone]       I just spoke to Leeya, I explained to her due to HIPPA law's you will need to speak to her.  She said the number above is the best one to call her at.       ----- Message -----         From: Lizbeth Bark         Sent: 12/14/2012   2:06 PM           To: Ralene Cork, DO      Subject: RE: phone message                                        I didn't see one and I did mention that we would need to speak to her.  This is pt phone # [phone].      ----- Message -----         From: Ralene Cork, DO         Sent: 12/14/2012   1:58 PM           To: Lizbeth Bark      Subject: FW: phone message                                        Did patient sign a HIPPA form giving [place] permission to discuss her case with her husband?            ----- Message -----         From: Lillia Pauls, CMA         Sent: 12/14/2012  12:06 PM           To: Ralene Cork, DO      Subject: FW: phone message                                                    ----- Message -----         From: Lizbeth Bark         Sent: 12/14/2012   8:57 AM           To: Lillia Pauls, CMA      Subject: phone message                                            Pt husband would like to discuss note that PT sent to Dr. Margaretha Sheffield as well as maybe getting pt an MRI of rt knee.                   ------

## 2012-12-15 ENCOUNTER — Telehealth: Payer: Self-pay | Admitting: *Deleted

## 2012-12-15 DIAGNOSIS — M25551 Pain in right hip: Secondary | ICD-10-CM

## 2012-12-15 NOTE — Telephone Encounter (Signed)
Pt notified of appt info via phone.  MRI Authorized # [id number]

## 2012-12-15 NOTE — Telephone Encounter (Signed)
Pt would like to have MRI at [facility] instead of [facility] due to a $300 decrease in price at [facility]. Called and scheduled MRI at [facility] on 12/19/12 at 2:30pm.  Pt notified of appt info.

## 2012-12-15 NOTE — Telephone Encounter (Signed)
Pt scheduled for MRI on 12/20/12 at 8:15am at [facility] - [address].  Left pt a VM to return my call re: appt info.

## 2012-12-15 NOTE — Telephone Encounter (Signed)
Message copied by Jacki Cones C on Thu Dec 15, 2012  8:54 AM ------      Message from: Reino Bellis R      Created: Wed Dec 14, 2012  4:55 PM      Regarding: FW: phone message      Contact: [phone]       Please schedule an MRI of her right knee to rule out a meniscal tear.      ----- Message -----         From: Lizbeth Bark         Sent: 12/14/2012   2:28 PM           To: Ralene Cork, DO      Subject: FW: phone message                                        I just spoke to Beuna, I explained to her due to HIPPA law's you will need to speak to her.  She said the number above is the best one to call her at.       ----- Message -----         From: Lizbeth Bark         Sent: 12/14/2012   2:06 PM           To: Ralene Cork, DO      Subject: RE: phone message                                        I didn't see one and I did mention that we would need to speak to her.  This is pt phone # [phone].      ----- Message -----         From: Ralene Cork, DO         Sent: 12/14/2012   1:58 PM           To: Lizbeth Bark      Subject: FW: phone message                                        Did patient sign a HIPPA form giving [place] permission to discuss her case with her husband?            ----- Message -----         From: Lillia Pauls, CMA         Sent: 12/14/2012  12:06 PM           To: Ralene Cork, DO      Subject: FW: phone message                                                    ----- Message -----         From: Lizbeth Bark         Sent: 12/14/2012   8:57 AM           To: Lillia Pauls, CMA  Subject: phone message                                            Pt husband would like to discuss note that PT sent to Dr. Margaretha Sheffield as well as maybe getting pt an MRI of rt knee.                         ------

## 2012-12-15 NOTE — Telephone Encounter (Signed)
Message copied by Jacki Cones C on Thu Dec 15, 2012  1:58 PM ------      Message from: CERESI, MELANIE L      Created: Thu Dec 15, 2012 11:59 AM      Regarding: phone message      Contact: [phone]       Husband has questions regarding mri order [id number]      He said he spoke to you earlier and wants to talk to you again.  ------

## 2012-12-20 ENCOUNTER — Telehealth: Payer: Self-pay | Admitting: Sports Medicine

## 2012-12-20 ENCOUNTER — Encounter: Payer: Self-pay | Admitting: *Deleted

## 2012-12-20 ENCOUNTER — Other Ambulatory Visit: Payer: BC Managed Care – PPO

## 2012-12-20 NOTE — Patient Instructions (Signed)
DR Thurston Hole [address] [place] 12/22/12 AT 2PM ARRIVE AT 145PM FOR REGISTRATION [phone]

## 2012-12-20 NOTE — Telephone Encounter (Signed)
I spoke with the patient on the phone today regarding MRI findings of her right knee. She has a large tear through the posterior horn and central edge of the body of the medial meniscus. She also has some degenerative changes along the medial tibial plateau. Based on her mechanical symptoms and persistent pain I recommended surgical consultation with Dr. Thurston Hole to discussed the merits of arthroscopy. Patient is agreeable with this plan. We will make the referral to Dr. Thurston Hole and I'll defer further workup and treatment of this problem to his discretion.

## 2012-12-28 ENCOUNTER — Ambulatory Visit: Payer: BC Managed Care – PPO | Admitting: Sports Medicine

## 2013-02-20 ENCOUNTER — Other Ambulatory Visit: Payer: Self-pay

## 2013-02-20 DIAGNOSIS — Z1231 Encounter for screening mammogram for malignant neoplasm of breast: Secondary | ICD-10-CM

## 2013-03-07 ENCOUNTER — Ambulatory Visit
Admission: RE | Admit: 2013-03-07 | Discharge: 2013-03-07 | Disposition: A | Discharge status: Home / Self Care | Payer: BC Managed Care – PPO | Source: Ambulatory Visit

## 2013-03-07 DIAGNOSIS — Z1231 Encounter for screening mammogram for malignant neoplasm of breast: Secondary | ICD-10-CM

## 2013-03-16 ENCOUNTER — Encounter: Payer: BC Managed Care – PPO | Admitting: Family Medicine

## 2013-06-28 ENCOUNTER — Ambulatory Visit: Payer: BC Managed Care – PPO | Admitting: Sports Medicine

## 2013-07-17 ENCOUNTER — Ambulatory Visit (INDEPENDENT_AMBULATORY_CARE_PROVIDER_SITE_OTHER): Payer: BC Managed Care – PPO | Admitting: Family Medicine

## 2013-07-17 VITALS — BP 112/60 | HR 58 | Temp 98.1°F | Resp 16 | Ht 64.0 in | Wt 128.0 lb

## 2013-07-17 DIAGNOSIS — J0100 Acute maxillary sinusitis, unspecified: Secondary | ICD-10-CM

## 2013-07-17 DIAGNOSIS — R51 Headache: Secondary | ICD-10-CM

## 2013-07-17 DIAGNOSIS — R0982 Postnasal drip: Secondary | ICD-10-CM

## 2013-07-17 DIAGNOSIS — R519 Headache, unspecified: Secondary | ICD-10-CM

## 2013-07-17 MED ORDER — FLUTICASONE PROPIONATE 50 MCG/ACT NA SUSP: 2.0000 | Freq: Every day | NASAL | Status: DC

## 2013-07-17 MED ORDER — AMOXICILLIN-POT CLAVULANATE 875-125 MG PO TABS: 1.0000 | ORAL_TABLET | Freq: Two times a day (BID) | ORAL | Status: DC

## 2013-07-17 MED ORDER — METHYLPREDNISOLONE ACETATE 80 MG/ML IJ SUSP
80.0000 mg | Freq: Once | INTRAMUSCULAR | Status: AC
  Administered 2013-07-17: 80 mg via INTRAMUSCULAR

## 2013-07-17 MED ORDER — METHYLPREDNISOLONE (PAK) 4 MG PO TABS: ORAL_TABLET | ORAL | Status: DC

## 2013-07-17 NOTE — Patient Instructions (Signed)

## 2013-07-17 NOTE — Progress Notes (Signed)
Chief Complaint:  Chief Complaint  Patient presents with  . Sinusitis    x 1 mth    HPI: Nicole Benson is a 63 y.o. female who is here for sinus Pain and pressure for one month, patient has a hx of sinus problems, which she has been treated with medication in the past . No fevers or chills or ear pain, + PND, She has had Ct of sinuses in past and was told she had sinus problems. She has tried otc meds without relief.  She took some leftover amoxacillin x 5 days 500 mg BID and also some steroid pills she had from 2012.   History reviewed. No pertinent past medical history. Past Surgical History  Procedure Laterality Date  . Knee surgery Right    History   Social History  . Marital Status: Married    Spouse Name: N/A    Number of Children: 1  . Years of Education: N/A   Occupational History  . botanist    Social History Main Topics  . Smoking status: Never Smoker   . Smokeless tobacco: None  . Alcohol Use: None  . Drug Use: None  . Sexual Activity: None   Other Topics Concern  . None   Social History Narrative  . None   History reviewed. No pertinent family history. Allergies  Allergen Reactions  . Sulfonamide Derivatives    Prior to Admission medications   Medication Sig Start Date End Date Taking? Authorizing Provider  amoxicillin (AMOXIL) 500 MG tablet Take 500 mg by mouth 2 (two) times daily.   Yes Historical Provider, MD     ROS: The patient denies fevers, chills, night sweats, unintentional weight loss, chest pain, palpitations, wheezing, dyspnea on exertion, nausea, vomiting, abdominal pain, dysuria, hematuria, melena, numbness, weakness, or tingling.   All other systems have been reviewed and were otherwise negative with the exception of those mentioned in the HPI and as above.    PHYSICAL EXAM: Filed Vitals:   07/17/13 0903  BP: 112/60  Pulse: 58  Temp: 98.1 F (36.7 C)  Resp: 16   Filed Vitals:   07/17/13 0903  Height: 5\' 4"  (1.626  m)  Weight: 128 lb (58.06 kg)   Body mass index is 21.96 kg/(m^2).  General: Alert, no acute distress HEENT:  Normocephalic, atraumatic, oropharynx patent. EOMI, PERRLA, TM nl. + max sinus tenderness.  Cardiovascular:  Regular rate and rhythm, no rubs murmurs or gallops.  No Carotid bruits, radial pulse intact. No pedal edema.  Respiratory: Clear to auscultation bilaterally.  No wheezes, rales, or rhonchi.  No cyanosis, no use of accessory musculature GI: No organomegaly, abdomen is soft and non-tender, positive bowel sounds.  No masses. Skin: No rashes. Neurologic: Facial musculature symmetric. Psychiatric: Patient is appropriate throughout our interaction. Lymphatic: No cervical lymphadenopathy Musculoskeletal: Gait intact.   LABS: Results for orders placed in visit on 02/05/12  COMPREHENSIVE METABOLIC PANEL      Result Value Range   Sodium 141  135 - 145 mEq/L   Potassium 4.0  3.5 - 5.3 mEq/L   Chloride 104  96 - 112 mEq/L   CO2 31  19 - 32 mEq/L   Glucose, Bld 88  70 - 99 mg/dL   BUN 18  6 - 23 mg/dL   Creat 0.85  0.50 - 1.10 mg/dL   Total Bilirubin 1.2  0.3 - 1.2 mg/dL   Alkaline Phosphatase 51  39 - 117 U/L   AST 23  0 - 37 U/L   ALT 22  0 - 35 U/L   Total Protein 6.1  6.0 - 8.3 g/dL   Albumin 4.1  3.5 - 5.2 g/dL   Calcium 9.1  8.4 - 10.5 mg/dL  LIPID PANEL      Result Value Range   Cholesterol 234 (*) 0 - 200 mg/dL   Triglycerides 53  <150 mg/dL   HDL 71  >39 mg/dL   Total CHOL/HDL Ratio 3.3     VLDL 11  0 - 40 mg/dL   LDL Cholesterol 152 (*) 0 - 99 mg/dL  POCT URINALYSIS DIPSTICK      Result Value Range   Color, UA yellow     Clarity, UA clear     Glucose, UA neg     Bilirubin, UA neg     Ketones, UA neg     Spec Grav, UA 1.020     Blood, UA neg     pH, UA 6.0     Protein, UA neg     Urobilinogen, UA 0.2     Nitrite, UA neg     Leukocytes, UA Negative    IFOBT (OCCULT BLOOD)      Result Value Range   IFOBT Negative       EKG/XRAY:   Primary read  interpreted by Dr. Marin Comment at [facility].   ASSESSMENT/PLAN: Encounter Diagnoses  Name Primary?  . Acute maxillary sinusitis Yes  . Facial pain   . PND (post-nasal drip)    1. Augmentin 2. Flonase 3. Medrol dose pack, no to take until tomorrow 4. She was given Depomedrol 80 mg IM x 1 today in clinic  Gross sideeffects, risk and benefits, and alternatives of medications d/w patient. Patient is aware that all medications have potential sideeffects and we are unable to predict every sideeffect or drug-drug interaction that may occur.  LE, Fisher, DO 07/17/2013 9:53 AM

## 2013-10-13 ENCOUNTER — Encounter: Payer: Self-pay | Admitting: Family Medicine

## 2013-10-13 ENCOUNTER — Ambulatory Visit (INDEPENDENT_AMBULATORY_CARE_PROVIDER_SITE_OTHER): Payer: BC Managed Care – PPO | Admitting: Family Medicine

## 2013-10-13 VITALS — BP 130/78 | HR 46 | Temp 97.7°F | Resp 16 | Ht 64.5 in | Wt 127.6 lb

## 2013-10-13 DIAGNOSIS — M25569 Pain in unspecified knee: Secondary | ICD-10-CM

## 2013-10-13 DIAGNOSIS — Z0000 Encounter for general adult medical examination without abnormal findings: Secondary | ICD-10-CM

## 2013-10-13 LAB — IFOBT (OCCULT BLOOD): IFOBT: NEGATIVE

## 2013-10-13 LAB — COMPREHENSIVE METABOLIC PANEL
Albumin: 4.4 g/dL (ref 3.5–5.2)
Alkaline Phosphatase: 54 U/L (ref 39–117)
BUN: 18 mg/dL (ref 6–23)
CO2: 32 mEq/L (ref 19–32)
Calcium: 9.3 mg/dL (ref 8.4–10.5)
Chloride: 102 mEq/L (ref 96–112)
Creat: 0.79 mg/dL (ref 0.50–1.10)
Glucose, Bld: 97 mg/dL (ref 70–99)
Potassium: 4.2 mEq/L (ref 3.5–5.3)
Total Protein: 6.7 g/dL (ref 6.0–8.3)

## 2013-10-13 LAB — LIPID PANEL
Cholesterol: 218 mg/dL — ABNORMAL HIGH (ref 0–200)
HDL: 98 mg/dL (ref >39)
LDL Cholesterol: 110 mg/dL — ABNORMAL HIGH (ref 0–99)
Total CHOL/HDL Ratio: 2.2 ratio
Triglycerides: 48 mg/dL (ref <150)
VLDL: 10 mg/dL (ref 0–40)

## 2013-10-13 LAB — CBC WITH DIFFERENTIAL/PLATELET
Basophils Absolute: 0 10*3/uL (ref 0.0–0.1)
Basophils Relative: 1 % (ref 0–1)
Eosinophils Absolute: 0.1 10*3/uL (ref 0.0–0.7)
Eosinophils Relative: 3 % (ref 0–5)
HCT: 41.7 % (ref 36.0–46.0)
Hemoglobin: 14.2 g/dL (ref 12.0–15.0)
Lymphocytes Relative: 29 % (ref 12–46)
Lymphs Abs: 1.1 10*3/uL (ref 0.7–4.0)
MCH: 30.1 pg (ref 26.0–34.0)
MCHC: 34.1 g/dL (ref 30.0–36.0)
MCV: 88.3 fL (ref 78.0–100.0)
Monocytes Absolute: 0.3 10*3/uL (ref 0.1–1.0)
Monocytes Relative: 8 % (ref 3–12)
Neutro Abs: 2.2 10*3/uL (ref 1.7–7.7)
Neutrophils Relative %: 59 % (ref 43–77)
Platelets: 139 10*3/uL — ABNORMAL LOW (ref 150–400)
RBC: 4.72 MIL/uL (ref 3.87–5.11)
RDW: 13.7 % (ref 11.5–15.5)
WBC: 3.8 10*3/uL — ABNORMAL LOW (ref 4.0–10.5)

## 2013-10-13 LAB — COMPREHENSIVE METABOLIC PANEL WITH GFR
ALT: 23 U/L (ref 0–35)
AST: 21 U/L (ref 0–37)
Sodium: 140 meq/L (ref 135–145)
Total Bilirubin: 1.7 mg/dL — ABNORMAL HIGH (ref 0.2–1.2)

## 2013-10-13 LAB — HM PAP SMEAR

## 2013-10-13 LAB — TSH: TSH: 0.476 u[IU]/mL (ref 0.350–4.500)

## 2013-10-13 MED ORDER — MELOXICAM 15 MG PO TABS: ORAL_TABLET | ORAL | Status: DC

## 2013-10-13 NOTE — Progress Notes (Signed)
 Chief Complaint:  Chief Complaint  Patient presents with  . Annual Exam    HPI: Nicole Benson is a 63 y.o. female who is here for annual visit She is UTD on colonscopy, age 58-then 56 , may have had polyps, [facility] on [facility]ogram Last pap was 12/2009. No abnormals paps since age 36.   History reviewed. No pertinent past medical history. Past Surgical History  Procedure Laterality Date  . Knee surgery Right    History   Social History  . Marital Status: Married    Spouse Name: N/A    Number of Children: 1  . Years of Education: N/A   Occupational History  . botanist    Social History Main Topics  . Smoking status: Never Smoker   . Smokeless tobacco: None  . Alcohol Use: None  . Drug Use: None  . Sexual Activity: None   Other Topics Concern  . None   Social History Narrative  . None   No family history on file. Allergies  Allergen Reactions  . Sulfonamide Derivatives    Prior to Admission medications   Medication Sig Start Date End Date Taking? Authorizing Provider  Calcium Carbonate-Vitamin D (CALCIUM 500 + D PO) Take by mouth daily.   Yes Historical Provider, MD  amoxicillin-clavulanate (AUGMENTIN) 875-125 MG per tablet Take 1 tablet by mouth 2 (two) times daily. 07/17/13    P , DO  fluticasone (FLONASE) 50 MCG/ACT nasal spray Place 2 sprays into both nostrils daily. 07/17/13    P , DO  methylPREDNIsolone (MEDROL DOSPACK) 4 MG tablet follow package directions 07/17/13    P , DO     ROS: The patient denies fevers, chills, night sweats, unintentional weight loss, chest pain, palpitations, wheezing, dyspnea on exertion, nausea, vomiting, abdominal pain, dysuria, hematuria, melena, numbness, weakness, or tingling.   All other systems have been reviewed and were otherwise negative with the exception of those mentioned in the HPI and as above.    PHYSICAL EXAM: Filed Vitals:   10/13/13 1055  BP: 130/78    Pulse: 46  Temp: 97.7 F (36.5 C)  Resp: 16   Filed Vitals:   10/13/13 1055  Height: 5' 4.5" (1.638 m)  Weight: 127 lb 9.6 oz (57.879 kg)   Body mass index is 21.57 kg/(m^2).  General: Alert, no acute distress HEENT:  Normocephalic, atraumatic, oropharynx patent. EOMI, PERRLA Cardiovascular:  Regular rate and rhythm, no rubs murmurs or gallops.  No Carotid bruits, radial pulse intact. No pedal edema.  Respiratory: Clear to auscultation bilaterally.  No wheezes, rales, or rhonchi.  No cyanosis, no use of accessory musculature GI: No organomegaly, abdomen is soft and non-tender, positive bowel sounds.  No masses. Skin: No rashes. Neurologic: Facial musculature symmetric. Psychiatric: Patient is appropriate throughout our interaction. Lymphatic: No cervical lymphadenopathy Musculoskeletal: Gait intact. Breast exam nl GU-vaginal and cervical exam nl, no masses/rashes, discharge Rectal exam-+ ext hemorrhoids  LABS: Results for orders placed in visit on 10/13/13  IFOBT (OCCULT BLOOD)      Result Value Ref Range   IFOBT Negative       EKG/XRAY:   Primary read interpreted by Dr. Marin Comment at [facility].   ASSESSMENT/PLAN: Encounter Diagnoses  Name Primary?  . Routine general medical examination at a health care facility Yes  . Knee pain    Nicole Benson is a very young appearing, active 63 y/o who is here for a well woman visit.  She has no complaints. Does yoga and cycles regularly.  Annual labs pending and pap UTD on mammogram next one is 02/2014 She thinks she is UTD on colonoscopy but not sure, has had 2 since age 77. Will check her records. IF not UTD then will call us.  No urinary sxs so will not get urine Rx mobic to use prn given to patient for occasional knee pain F/u in 1 year   Gross sideeffects, risk and benefits, and alternatives of medications d/w patient. Patient is aware that all medications have potential sideeffects and we are unable to predict every sideeffect or  drug-drug interaction that may occur.  Glenford Bayley, DO 10/13/2013 12:33 PM

## 2013-10-16 LAB — PAP IG W/ RFLX HPV ASCU

## 2013-10-24 ENCOUNTER — Other Ambulatory Visit: Payer: Self-pay | Admitting: Family Medicine

## 2013-10-24 DIAGNOSIS — R7989 Other specified abnormal findings of blood chemistry: Secondary | ICD-10-CM

## 2013-11-27 ENCOUNTER — Other Ambulatory Visit: Payer: BC Managed Care – PPO | Admitting: *Deleted

## 2013-11-27 DIAGNOSIS — R7989 Other specified abnormal findings of blood chemistry: Secondary | ICD-10-CM

## 2013-11-28 LAB — CBC WITH DIFFERENTIAL/PLATELET
Basophils Absolute: 0.1 10*3/uL (ref 0.0–0.1)
Basophils Relative: 1 % (ref 0–1)
Eosinophils Absolute: 0.2 10*3/uL (ref 0.0–0.7)
Eosinophils Relative: 4 % (ref 0–5)
HCT: 38.5 % (ref 36.0–46.0)
Hemoglobin: 12.7 g/dL (ref 12.0–15.0)
Lymphocytes Relative: 28 % (ref 12–46)
Lymphs Abs: 1.4 10*3/uL (ref 0.7–4.0)
MCH: 28.9 pg (ref 26.0–34.0)
MCHC: 33 g/dL (ref 30.0–36.0)
MCV: 87.5 fL (ref 78.0–100.0)
Monocytes Absolute: 0.4 10*3/uL (ref 0.1–1.0)
Monocytes Relative: 7 % (ref 3–12)
Neutro Abs: 3 10*3/uL (ref 1.7–7.7)
Neutrophils Relative %: 60 % (ref 43–77)
Platelets: 127 10*3/uL — ABNORMAL LOW (ref 150–400)
RBC: 4.4 MIL/uL (ref 3.87–5.11)
RDW: 13.4 % (ref 11.5–15.5)
WBC: 5 10*3/uL (ref 4.0–10.5)

## 2014-02-27 ENCOUNTER — Other Ambulatory Visit: Payer: Self-pay

## 2014-02-27 DIAGNOSIS — Z1231 Encounter for screening mammogram for malignant neoplasm of breast: Secondary | ICD-10-CM

## 2014-03-13 ENCOUNTER — Ambulatory Visit
Admission: RE | Admit: 2014-03-13 | Discharge: 2014-03-13 | Disposition: A | Discharge status: Home / Self Care | Payer: BC Managed Care – PPO | Source: Ambulatory Visit

## 2014-03-13 DIAGNOSIS — Z1231 Encounter for screening mammogram for malignant neoplasm of breast: Secondary | ICD-10-CM

## 2014-06-15 HISTORY — PX: KNEE ARTHROSCOPY: SHX127

## 2014-08-06 ENCOUNTER — Ambulatory Visit (INDEPENDENT_AMBULATORY_CARE_PROVIDER_SITE_OTHER): Payer: BLUE CROSS/BLUE SHIELD | Admitting: Sports Medicine

## 2014-08-06 ENCOUNTER — Encounter: Payer: Self-pay | Admitting: Sports Medicine

## 2014-08-06 ENCOUNTER — Ambulatory Visit
Admission: RE | Admit: 2014-08-06 | Discharge: 2014-08-06 | Disposition: A | Discharge status: Home / Self Care | Payer: BLUE CROSS/BLUE SHIELD | Source: Ambulatory Visit | Attending: Sports Medicine | Admitting: Sports Medicine

## 2014-08-06 VITALS — BP 113/73 | HR 68 | Ht 65.0 in | Wt 130.0 lb

## 2014-08-06 DIAGNOSIS — M545 Low back pain, unspecified: Secondary | ICD-10-CM

## 2014-08-06 MED ORDER — METHYLPREDNISOLONE ACETATE 80 MG/ML IJ SUSP
80.0000 mg | Freq: Once | INTRAMUSCULAR | Status: AC
  Administered 2014-08-06: 80 mg via INTRAMUSCULAR

## 2014-08-06 NOTE — Progress Notes (Signed)
   Subjective:    Patient ID: Nicole Benson, female    DOB: 03-21-1951, 64 y.o.   MRN: [id number]  HPI chief complaint: Low back pain  Patient comes in today complaining of 4 weeks of low back pain. No trauma that she can recall. She initially was getting some achy discomfort across her low back which has recently turned into more of a sharp pain over the past week or so. She has had similar episodes in the past. Each episode lasted 1-2 weeks before spontaneously resolving. This episode has persisted. She has tried massage as well as physical therapy although her physical therapy has been very limited. She has tried ibuprofen, aspirin, and a muscle relaxer but nothing has seemed to help. She is experiencing significant stiffness first thing in the morning. She also has pain and stiffness with sitting in a car. Swimming causes her pain to improve. Walking is also quite comfortable. She denies radiating pain into her legs. She does get some numbness across her low-back but denies radiating numbness into her legs. No weakness. She is here today with her husband. Interim medical history reviewed Medications reviewed Allergies reviewed    Review of Systems     Objective:   Physical Exam Well-developed, well-nourished. No acute distress. Awake alert and oriented 3. Vital signs reviewed.  Lumbar spine: Patient has pain with forward flexion. No pain with extension. Mild diffuse spasm of the paraspinal musculature bilaterally. No tenderness over the SI joints. Negative straight leg raise bilaterally. Negative logroll bilaterally. Strength is 5/5 in both lower extremities and reflexes are brisk and equal at the Achilles and patellar tendons bilaterally. No atrophy. Sensation is intact to light touch grossly.       Assessment & Plan:  Low back pain likely secondary to discogenic low back pain/degenerative disc disease  80 mg of Depo-Medrol IM. X-rays of the lumbar spine to evaluate degree of  degenerative disc disease present. Patient will resume physical therapy this time working with Barbaraann Barthel. Follow-up with me in 3 weeks. If symptoms persist or worsen consider further diagnostic imaging.

## 2014-08-08 ENCOUNTER — Telehealth: Payer: Self-pay | Admitting: *Deleted

## 2014-08-08 NOTE — Telephone Encounter (Signed)
-----   Message from Thurman Coyer, DO sent at 08/08/2014  9:00 AM EST ----- Regarding: RE: voicemail message Contact: [phone] Tell her that she does have a little bit of narrowing between a couple of the discs (mild degenerative disc disease) but not severe. She needs to see Barbaraann Barthel and follow up with me in a couple of weeks.  ----- Message -----    From: Laurey Arrow, RN    Sent: 08/08/2014   8:53 AM      To: Thurman Coyer, DO Subject: FW: voicemail message                          Let me know what I need to tell her  ----- Message -----    From: Carolyne Littles    Sent: 08/08/2014   8:15 AM      To: Laurey Arrow, RN Subject: voicemail message                              Pt is looking for xray results

## 2014-08-08 NOTE — Telephone Encounter (Signed)
Called pt with xray results, she will see O'Halloran on March 3 and will follow up with [place] on March 14

## 2014-08-27 ENCOUNTER — Ambulatory Visit: Payer: BLUE CROSS/BLUE SHIELD | Admitting: Sports Medicine

## 2014-11-21 ENCOUNTER — Ambulatory Visit: Payer: Self-pay | Admitting: Physician Assistant

## 2015-01-02 ENCOUNTER — Ambulatory Visit (INDEPENDENT_AMBULATORY_CARE_PROVIDER_SITE_OTHER): Payer: BLUE CROSS/BLUE SHIELD | Admitting: Physician Assistant

## 2015-01-02 ENCOUNTER — Encounter: Payer: Self-pay | Admitting: Physician Assistant

## 2015-01-02 VITALS — BP 122/76 | HR 52 | Temp 98.3°F | Resp 16 | Ht 64.5 in | Wt 128.8 lb

## 2015-01-02 DIAGNOSIS — Z1329 Encounter for screening for other suspected endocrine disorder: Secondary | ICD-10-CM

## 2015-01-02 DIAGNOSIS — Z1159 Encounter for screening for other viral diseases: Secondary | ICD-10-CM | POA: Diagnosis not present

## 2015-01-02 DIAGNOSIS — L299 Pruritus, unspecified: Secondary | ICD-10-CM | POA: Diagnosis not present

## 2015-01-02 DIAGNOSIS — Z13228 Encounter for screening for other metabolic disorders: Secondary | ICD-10-CM | POA: Diagnosis not present

## 2015-01-02 DIAGNOSIS — Z13220 Encounter for screening for lipoid disorders: Secondary | ICD-10-CM | POA: Diagnosis not present

## 2015-01-02 DIAGNOSIS — Z114 Encounter for screening for human immunodeficiency virus [HIV]: Secondary | ICD-10-CM | POA: Diagnosis not present

## 2015-01-02 DIAGNOSIS — Z130 Encounter for screening for diseases of the blood and blood-forming organs and certain disorders involving the immune mechanism: Secondary | ICD-10-CM

## 2015-01-02 DIAGNOSIS — Z1321 Encounter for screening for nutritional disorder: Secondary | ICD-10-CM | POA: Diagnosis not present

## 2015-01-02 DIAGNOSIS — Z0000 Encounter for general adult medical examination without abnormal findings: Secondary | ICD-10-CM | POA: Diagnosis not present

## 2015-01-02 DIAGNOSIS — Z1211 Encounter for screening for malignant neoplasm of colon: Secondary | ICD-10-CM | POA: Diagnosis not present

## 2015-01-02 DIAGNOSIS — K121 Other forms of stomatitis: Secondary | ICD-10-CM

## 2015-01-02 MED ORDER — TRIAMCINOLONE ACETONIDE 0.1 % MT PSTE: 1.0000 "application " | PASTE | Freq: Two times a day (BID) | OROMUCOSAL | Status: DC

## 2015-01-02 NOTE — Progress Notes (Signed)
Nicole Benson  MRN: [id number] DOB: [birth year]  Subjective:  Pt presents to clinic for her CPE.  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  Last dental exam: within 6 months Last vision exam: spring 2016 Last pap smear: 2015 Last mammogram: Fall 2015 Last colonoscopy: 2008 Vaccinations -      Tetanus - UTD      Zostavax - UTD  Patient Active Problem List   Diagnosis Date Noted  . HIP PAIN, RIGHT 10/18/2009  . TROCHANTERIC BURSITIS 10/18/2009  . SUBSCAPULARIS SPRAIN AND STRAIN 02/21/2009  . 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 Persistent disorder of initiating or maintaining sleep 12/23/2006  . MENISCUS INJURY 12/23/2006    Current Outpatient Prescriptions on File Prior to Visit  Medication Sig Dispense Refill  . Calcium Carbonate-Vitamin D (CALCIUM 500 + D PO) Take by mouth daily.     No current facility-administered medications on file prior to visit.    Allergies  Allergen Reactions  . Sulfonamide Derivatives     History   Social History  . Marital Status: Married    Spouse Name: N/A  . Number of Children: 1  . Years of Education: N/A   Occupational History  . botanist    Social History Main Topics  . Smoking status: Never Smoker   .  Smokeless tobacco: Not on file  . Alcohol Use: 1.8 - 2.4 oz/week    3-4 Standard drinks or equivalent per week     Comment: 4 drinks a week  . Drug Use: No  . Sexual Activity:    Partners: Male   Other Topics Concern  . None   Social History Narrative   Married. Education: [employer]. Exercise: Yes.    Past Surgical History  Procedure Laterality Date  . Knee surgery Right     History reviewed. No pertinent family history.  Review of Systems  Constitutional: Negative.   HENT: Positive for ear pain (itching ). Negative for ear discharge and hearing loss.   Eyes: Negative.   Respiratory: Negative.   Cardiovascular: Negative.   Gastrointestinal: Negative.   Endocrine: Negative.   Genitourinary: Negative.   Musculoskeletal: Positive for myalgias (mainly in her knees with decrease physical activity).  Skin: Negative.   Allergic/Immunologic: Negative.   Neurological: Negative.   Hematological: Negative.   Psychiatric/Behavioral: Negative.    Objective:  BP 122/76 mmHg  Pulse 52  Temp(Src) 98.3 F (36.8 C) (Oral)  Resp 16  Ht 5' 4.5" (1.638 m)  Wt 128 lb 12.8 oz (58.423 kg)  BMI 21.77 kg/m2  SpO2 99%  Physical Exam  Constitutional: She  is oriented to person, place, and time and well-developed, well-nourished, and in no distress.  HENT:  Head: Normocephalic and atraumatic.  Right Ear: Hearing, tympanic membrane, external ear and ear canal normal.  Left Ear: Hearing, tympanic membrane, external ear and ear canal normal.  Nose: Nose normal.  Mouth/Throat: Uvula is midline, oropharynx is clear and moist and mucous membranes are normal. Oral lesions (small ulceration left lower lip buccal mucosa) present.  External canal on the right slight dry skin at opening  Eyes: Conjunctivae and EOM are normal. Pupils are equal, round, and reactive to light.  Neck: Trachea normal and normal range of motion. Neck supple. No thyroid mass and no thyromegaly present.  Cardiovascular:  Normal rate, regular rhythm and normal heart sounds.   No murmur heard. Pulmonary/Chest: Effort normal and breath sounds normal. She has no wheezes. Right breast exhibits no inverted nipple, no mass, no nipple discharge, no skin change and no tenderness. Left breast exhibits no inverted nipple, no mass, no nipple discharge, no skin change and no tenderness. Breasts are symmetrical.  Abdominal: Soft. Bowel sounds are normal. There is no tenderness.  Musculoskeletal: Normal range of motion.  Lymphadenopathy:    She has no cervical adenopathy.  Neurological: She is alert and oriented to person, place, and time. She has normal motor skills, normal sensation, normal strength and normal reflexes. Gait normal.  Skin: Skin is warm and dry.     Psychiatric: Mood, memory, affect and judgment normal.    Visual Acuity Screening   Right eye Left eye Both eyes  Without correction:     With correction: 20/20 20/30 20/20    Orthostatic VS for the past 24 hrs:  BP- Lying Pulse- Lying BP- Sitting Pulse- Sitting BP- Standing at 0 minutes Pulse- Standing at 0 minutes  01/02/15 1636 110/68 mmHg (!) 45 118/72 mmHg 51 122/71 mmHg 59   Assessment and Plan :  Annual physical exam - anticipatory guidance  Screening for deficiency anemia - Plan: CBC with Differential/Platelet  Screening for metabolic disorder - Plan: COMPLETE METABOLIC PANEL WITH GFR  Screening cholesterol level - Plan: Lipid panel  Screening for thyroid disorder - Plan: TSH  Encounter for vitamin deficiency screening - Plan: Vit D  25 hydroxy (rtn osteoporosis monitoring) - we discussed splitting her Calcium up to no more than 500mg  at a time and not with food to get maximum absorption  Screening for HIV (human immunodeficiency virus) - Plan: HIV antibody  Need for hepatitis C screening test - Plan: Hepatitis C antibody  Screening for colon cancer - Plan: IFOBT POC (occult bld, rslt in office)  Mouth ulcer - Plan: triamcinolone  (KENALOG) 0.1 % paste - to help expedite her healing   Ear itching - there is nothing in her ears today other than a small amount of dry skin - she can use sweet oil or olive to help with moisture  Windell Hummingbird PA-C  [facility] 01/02/2015 4:42 PM

## 2015-01-02 NOTE — Patient Instructions (Signed)
In order for your supplemental calcium to be effective.  Please take calcium either on an empty stomach or with food that does not contain calcium.  Your body is able only to absorb 500mg  of Calcium at a time from any one source.  Do not take with a MVI with iron because iron does not allow th calcium to be absorbed.  Please take Calcium citrate 500mg  2-3x/day.  This supplement should have Vit D in it and if your pills do not please add addition Vit D 400 IU with each dose.  Keep results of your colonoscopy to me.  Keeping You Healthy  Get These Tests  Blood Pressure- Have your blood pressure checked by your healthcare provider at least once a year.  Normal blood pressure is 120/80.  Weight- Have your body mass index (BMI) calculated to screen for obesity.  BMI is a measure of body fat based on height and weight.  You can calculate your own BMI at [employer]  Cholesterol- Have your cholesterol checked every year.  Diabetes- Have your blood sugar checked every year if you have high blood pressure, high cholesterol, a family history of diabetes or if you are overweight.  Pap Test - Have a pap test every 1 to 5 years if you have been sexually active.  If you are older than 65 and recent pap tests have been normal you may not need additional pap tests.  In addition, if you have had a hysterectomy  for benign disease additional pap tests are not necessary.  Mammogram-Yearly mammograms are essential for early detection of breast cancer  Screening for Colon Cancer- Colonoscopy starting at age 66. Screening may begin sooner depending on your family history and other health conditions.  Follow up colonoscopy as directed by your Gastroenterologist.  Screening for Osteoporosis- Screening begins at age 56 with bone density scanning, sooner if you are at higher risk for developing Osteoporosis.  Get these medicines  Calcium with Vitamin D- Your body requires 1200-1500 mg of Calcium a day  and [phone] IU of Vitamin D a day.  You can only absorb 500 mg of Calcium at a time therefore Calcium must be taken in 2 or 3 separate doses throughout the day.  Hormones- Hormone therapy has been associated with increased risk for certain cancers and heart disease.  Talk to your healthcare provider about if you need relief from menopausal symptoms.  Aspirin- Ask your healthcare provider about taking Aspirin to prevent Heart Disease and Stroke.  Get these Immuniztions  Flu shot- Every fall  Pneumonia shot- Once after the age of 36; if you are younger ask your healthcare provider if you need a pneumonia shot.  Tetanus- Every ten years.  Zostavax- Once after the age of 38 to prevent shingles.  Take these steps  Don't smoke- Your healthcare provider can help you quit. For tips on how to quit, ask your healthcare provider or go to www.smokefree.gov or call 1-800 QUIT-NOW.  Be physically active- Exercise 5 days a week for a minimum of 30 minutes.  If you are not already physically active, start slow and gradually work up to 30 minutes of moderate physical activity.  Try walking, dancing, bike riding, swimming, etc.  Eat a healthy diet- Eat a variety of healthy foods such as fruits, vegetables, whole grains, low fat milk, low fat cheeses, yogurt, lean meats, chicken, fish, eggs, dried beans, tofu, etc.  For more information go to www.thenutritionsource.org  Dental visit- Brush and floss teeth twice daily;  visit your dentist twice a year.  Eye exam- Visit your Optometrist or Ophthalmologist yearly.  Drink alcohol in moderation- Limit alcohol intake to one drink or less a day.  Never drink and drive.  Depression- Your emotional health is as important as your physical health.  If you're feeling down or losing interest in things you normally enjoy, please talk to your healthcare provider.  Seat Belts- can save your life; always wear one  Smoke/Carbon Monoxide detectors- These detectors need  to be installed on the appropriate level of your home.  Replace batteries at least once a year.  Violence- If anyone is threatening or hurting you, please tell your healthcare provider.  Living Will/ Health care power of attorney- Discuss with your healthcare provider and family.

## 2015-01-03 LAB — CBC WITH DIFFERENTIAL/PLATELET
Basophils Absolute: 0 10*3/uL (ref 0.0–0.1)
Basophils Relative: 1 % (ref 0–1)
EOS ABS: 0.2 10*3/uL (ref 0.0–0.7)
Eosinophils Relative: 4 % (ref 0–5)
HCT: 41.4 % (ref 36.0–46.0)
Hemoglobin: 13.3 g/dL (ref 12.0–15.0)
LYMPHS PCT: 27 % (ref 12–46)
Lymphs Abs: 1.1 10*3/uL (ref 0.7–4.0)
MCH: 29.2 pg (ref 26.0–34.0)
MCHC: 32.1 g/dL (ref 30.0–36.0)
MCV: 90.8 fL (ref 78.0–100.0)
MPV: 13.2 fL — ABNORMAL HIGH (ref 8.6–12.4)
Monocytes Absolute: 0.4 10*3/uL (ref 0.1–1.0)
Monocytes Relative: 11 % (ref 3–12)
Neutro Abs: 2.2 10*3/uL (ref 1.7–7.7)
Neutrophils Relative %: 57 % (ref 43–77)
Platelets: 151 10*3/uL (ref 150–400)
RBC: 4.56 MIL/uL (ref 3.87–5.11)
RDW: 13.9 % (ref 11.5–15.5)
WBC: 3.9 10*3/uL — ABNORMAL LOW (ref 4.0–10.5)

## 2015-01-03 LAB — COMPLETE METABOLIC PANEL WITH GFR
ALT: 14 U/L (ref 0–35)
AST: 16 U/L (ref 0–37)
Albumin: 4.1 g/dL (ref 3.5–5.2)
Alkaline Phosphatase: 56 U/L (ref 39–117)
BUN: 17 mg/dL (ref 6–23)
CHLORIDE: 103 meq/L (ref 96–112)
CO2: 27 mEq/L (ref 19–32)
Calcium: 9.3 mg/dL (ref 8.4–10.5)
Creat: 0.73 mg/dL (ref 0.50–1.10)
GFR, Est African American: >89 mL/min
GFR, Est Non African American: 88 mL/min
Glucose, Bld: 90 mg/dL (ref 70–99)
POTASSIUM: 4.3 meq/L (ref 3.5–5.3)
Sodium: 141 mEq/L (ref 135–145)
TOTAL PROTEIN: 6.6 g/dL (ref 6.0–8.3)
Total Bilirubin: 1 mg/dL (ref 0.2–1.2)

## 2015-01-03 LAB — LIPID PANEL
CHOLESTEROL: 247 mg/dL — AB (ref 0–200)
HDL: 103 mg/dL (ref >=46)
LDL Cholesterol: 134 mg/dL — ABNORMAL HIGH (ref 0–99)
Total CHOL/HDL Ratio: 2.4 Ratio
Triglycerides: 51 mg/dL (ref <150)
VLDL: 10 mg/dL (ref 0–40)

## 2015-01-03 LAB — TSH: TSH: 0.689 u[IU]/mL (ref 0.350–4.500)

## 2015-01-03 LAB — HEPATITIS C ANTIBODY: HCV Ab: NEGATIVE

## 2015-01-03 LAB — VITAMIN D 25 HYDROXY (VIT D DEFICIENCY, FRACTURES): Vit D, 25-Hydroxy: 33 ng/mL (ref 30–100)

## 2015-01-03 LAB — HIV ANTIBODY (ROUTINE TESTING W REFLEX): HIV 1&2 Ab, 4th Generation: NONREACTIVE

## 2015-02-19 ENCOUNTER — Other Ambulatory Visit: Payer: Self-pay

## 2015-02-19 DIAGNOSIS — Z1231 Encounter for screening mammogram for malignant neoplasm of breast: Secondary | ICD-10-CM

## 2015-02-25 LAB — IFOBT (OCCULT BLOOD): IMMUNOLOGICAL FECAL OCCULT BLOOD TEST: NEGATIVE

## 2015-03-18 ENCOUNTER — Ambulatory Visit
Admission: RE | Admit: 2015-03-18 | Discharge: 2015-03-18 | Disposition: A | Discharge status: Home / Self Care | Payer: BLUE CROSS/BLUE SHIELD | Source: Ambulatory Visit

## 2015-03-18 DIAGNOSIS — Z1231 Encounter for screening mammogram for malignant neoplasm of breast: Secondary | ICD-10-CM

## 2015-03-29 ENCOUNTER — Ambulatory Visit (INDEPENDENT_AMBULATORY_CARE_PROVIDER_SITE_OTHER): Payer: BLUE CROSS/BLUE SHIELD | Admitting: Emergency Medicine

## 2015-03-29 VITALS — BP 108/64 | HR 57 | Temp 98.7°F | Resp 18 | Ht 65.75 in | Wt 132.4 lb

## 2015-03-29 DIAGNOSIS — J0101 Acute recurrent maxillary sinusitis: Secondary | ICD-10-CM | POA: Diagnosis not present

## 2015-03-29 MED ORDER — CEFDINIR 300 MG PO CAPS: 600.0000 mg | ORAL_CAPSULE | Freq: Every day | ORAL | Status: DC

## 2015-03-29 MED ORDER — FLUTICASONE PROPIONATE 50 MCG/ACT NA SUSP: 2.0000 | Freq: Every day | NASAL | Status: DC

## 2015-03-29 MED ORDER — PREDNISONE 10 MG PO TABS: ORAL_TABLET | ORAL | Status: DC

## 2015-03-29 NOTE — Progress Notes (Signed)
This chart was scribed for Nena Jordan, MD by Thea Alken, ED Scribe. This patient was seen in room 2 and the patient's care was started at 8:14 AM.  Chief Complaint:  Chief Complaint  Patient presents with  . Sinusitis    x2-3 days, pressure in between eyes and around nose.     HPI: Nicole Benson is a 64 y.o. female who reports to [facility] today complaining of worsening sinus pressure that began 2-3 days ago. Her last sinus infection was about 1 year ago. States she will be traveling  to [place] and [place] for 10 days within the next week and reports pressure worsens when flying. She has been prescribed prednisone in the past and which has worked well for her. She has left over flonase at home but has not been using this.   Past Medical History  Diagnosis Date  . Heart murmur    Past Surgical History  Procedure Laterality Date  . Knee surgery Right    Social History   Social History  . Marital Status: Married    Spouse Name: N/A  . Number of Children: 1  . Years of Education: N/A   Occupational History  . botanist    Social History Main Topics  . Smoking status: Never Smoker   . Smokeless tobacco: None  . Alcohol Use: 1.8 - 2.4 oz/week    3-4 Standard drinks or equivalent per week     Comment: 4 drinks a week  . Drug Use: No  . Sexual Activity:    Partners: Male   Other Topics Concern  . None   Social History Narrative   Married. Education: [employer]. Exercise: Yes.   History reviewed. No pertinent family history. Allergies  Allergen Reactions  . Sulfonamide Derivatives    Prior to Admission medications   Medication Sig Start Date End Date Taking? Authorizing Provider  Calcium Carbonate-Vitamin D (CALCIUM 500 + D PO) Take by mouth daily.    Historical Provider, MD  triamcinolone (KENALOG) 0.1 % paste Use as directed 1 application in the mouth or throat 2 (two) times daily. Patient not taking: Reported on 03/29/2015 01/02/15   Mancel Bale, PA-C     ROS:  The patient denies fevers, chills, night sweats, unintentional weight loss, chest pain, palpitations, wheezing, dyspnea on exertion, nausea, vomiting, abdominal pain, dysuria, hematuria, melena, numbness, weakness, or tingling.   All other systems have been reviewed and were otherwise negative with the exception of those mentioned in the HPI and as above.    PHYSICAL EXAM: Filed Vitals:   03/29/15 0812  BP: 108/64  Pulse: 57  Temp: 98.7 F (37.1 C)  Resp: 18   Body mass index is 21.53 kg/(m^2).   General: Alert, no acute distress HEENT:  Normocephalic, atraumatic, oropharynx patent. Nose congested. Oral pharynx slightly red Eye: EOMI, PEERLDC Cardiovascular:  Regular rate and rhythm, no rubs murmurs or gallops.  No Carotid bruits, radial pulse intact. No pedal edema.  Respiratory: Clear to auscultation bilaterally.  No wheezes, rales, or rhonchi.  No cyanosis, no use of accessory musculature Abdominal: No organomegaly, abdomen is soft and non-tender, positive bowel sounds.  No masses. Musculoskeletal: Gait intact. No edema, tenderness Skin: No rashes. Neurologic: Facial musculature symmetric. Psychiatric: Patient acts appropriately throughout our interaction. Lymphatic: No cervical or submandibular lymphadenopathy   ASSESSMENT/PLAN: Patient presents with acute maxillary sinusitis. Will treat with Omnicef and prednisone. She was given one refill on her medications. She is flying to [place] in approximately  13 days. She was given a refill in case she is not clear prior to her flight.I personally performed the services described in this documentation, which was scribed in my presence. The recorded information has been reviewed and is accurate.   Gross sideeffects, risk and benefits, and alternatives of medications d/w patient. Patient is aware that all medications have potential sideeffects and we are unable to predict every sideeffect or drug-drug interaction that may occur.  By  signing my name below, I, Raven Small, attest that this documentation has been prepared under the direction and in the presence of Nena Jordan, MD.  Electronically Signed: Thea Alken, ED Scribe. 03/29/2015. 8:22 AM.  Arlyss Queen MD 03/29/2015 8:14 AM

## 2015-03-29 NOTE — Patient Instructions (Signed)

## 2015-07-05 ENCOUNTER — Encounter: Payer: Self-pay | Admitting: Family Medicine

## 2015-07-10 ENCOUNTER — Encounter: Payer: Self-pay | Admitting: Family Medicine

## 2015-08-12 ENCOUNTER — Ambulatory Visit (INDEPENDENT_AMBULATORY_CARE_PROVIDER_SITE_OTHER): Payer: BLUE CROSS/BLUE SHIELD | Admitting: Physician Assistant

## 2015-08-12 VITALS — BP 128/64 | HR 87 | Temp 97.8°F | Resp 16 | Ht 65.0 in | Wt 129.0 lb

## 2015-08-12 DIAGNOSIS — J019 Acute sinusitis, unspecified: Secondary | ICD-10-CM

## 2015-08-12 MED ORDER — GUAIFENESIN ER 1200 MG PO TB12: 1.0000 | ORAL_TABLET | Freq: Two times a day (BID) | ORAL | Status: DC | PRN

## 2015-08-12 MED ORDER — CETIRIZINE HCL 10 MG PO TABS: 10.0000 mg | ORAL_TABLET | Freq: Every day | ORAL | Status: DC

## 2015-08-12 MED ORDER — AMOXICILLIN-POT CLAVULANATE 875-125 MG PO TABS: 1.0000 | ORAL_TABLET | Freq: Two times a day (BID) | ORAL | Status: AC

## 2015-08-12 NOTE — Patient Instructions (Signed)
Please continue to hydrate--64 oz of water per day , if not more!! I would like you to have this round of augmentin.  This is the antibiotic for infection. Please take the mucinex to help thin out that mucus that is in your sinus passages and in the ear. I would like you to continue taking the sudafed as a decongestant, and please continue to use the flonase. You may also do the saline nasal spray, (or a nedi pot).    Sinusitis, Adult Sinusitis is redness, soreness, and inflammation of the paranasal sinuses. Paranasal sinuses are air pockets within the bones of your face. They are located beneath your eyes, in the middle of your forehead, and above your eyes. In healthy paranasal sinuses, mucus is able to drain out, and air is able to circulate through them by way of your nose. However, when your paranasal sinuses are inflamed, mucus and air can become trapped. This can allow bacteria and other germs to grow and cause infection. Sinusitis can develop quickly and last only a short time (acute) or continue over a long period (chronic). Sinusitis that lasts for more than 12 weeks is considered chronic. CAUSES Causes of sinusitis include:  Allergies.  Structural abnormalities, such as displacement of the cartilage that separates your nostrils (deviated septum), which can decrease the air flow through your nose and sinuses and affect sinus drainage.  Functional abnormalities, such as when the small hairs (cilia) that line your sinuses and help remove mucus do not work properly or are not present. SIGNS AND SYMPTOMS Symptoms of acute and chronic sinusitis are the same. The primary symptoms are pain and pressure around the affected sinuses. Other symptoms include:  Upper toothache.  Earache.  Headache.  Bad breath.  Decreased sense of smell and taste.  A cough, which worsens when you are lying flat.  Fatigue.  Fever.  Thick drainage from your nose, which often is green and may contain  pus (purulent).  Swelling and warmth over the affected sinuses. DIAGNOSIS Your health care provider will perform a physical exam. During your exam, your health care provider may perform any of the following to help determine if you have acute sinusitis or chronic sinusitis:  Look in your nose for signs of abnormal growths in your nostrils (nasal polyps).  Tap over the affected sinus to check for signs of infection.  View the inside of your sinuses using an imaging device that has a light attached (endoscope). If your health care provider suspects that you have chronic sinusitis, one or more of the following tests may be recommended:  Allergy tests.  Nasal culture. A sample of mucus is taken from your nose, sent to a lab, and screened for bacteria.  Nasal cytology. A sample of mucus is taken from your nose and examined by your health care provider to determine if your sinusitis is related to an allergy. TREATMENT Most cases of acute sinusitis are related to a viral infection and will resolve on their own within 10 days. Sometimes, medicines are prescribed to help relieve symptoms of both acute and chronic sinusitis. These may include pain medicines, decongestants, nasal steroid sprays, or saline sprays. However, for sinusitis related to a bacterial infection, your health care provider will prescribe antibiotic medicines. These are medicines that will help kill the bacteria causing the infection. Rarely, sinusitis is caused by a fungal infection. In these cases, your health care provider will prescribe antifungal medicine. For some cases of chronic sinusitis, surgery is needed. Generally, these  are cases in which sinusitis recurs more than 3 times per year, despite other treatments. HOME CARE INSTRUCTIONS  Drink plenty of water. Water helps thin the mucus so your sinuses can drain more easily.  Use a humidifier.  Inhale steam 3-4 times a day (for example, sit in the bathroom with the  shower running).  Apply a warm, moist washcloth to your face 3-4 times a day, or as directed by your health care provider.  Use saline nasal sprays to help moisten and clean your sinuses.  Take medicines only as directed by your health care provider.  If you were prescribed either an antibiotic or antifungal medicine, finish it all even if you start to feel better. SEEK IMMEDIATE MEDICAL CARE IF:  You have increasing pain or severe headaches.  You have nausea, vomiting, or drowsiness.  You have swelling around your face.  You have vision problems.  You have a stiff neck.  You have difficulty breathing.   This information is not intended to replace advice given to you by your health care provider. Make sure you discuss any questions you have with your health care provider.   Document Released: 06/01/2005 Document Revised: 06/22/2014 Document Reviewed: 06/16/2011 Elsevier Interactive Patient Education [employer].

## 2015-08-12 NOTE — Progress Notes (Signed)
[facility] [address] [place] [address] [phone]  Date:  08/12/2015   Name:  Malkie Shelden   DOB:  30-Aug-1950   MRN:  [id number]  PCP:  Lamar Blinks, MD    History of Present Illness:  Linley Heinert is a 65 y.o. female patient who presents to [facility] for cc of sinus pain, congestion.    4 weeks of sxs started as cold symptoms.  Congestion, pressure at the base of her nose.  Thick mucus.  She had taken leftover prednisone that was given to her at a prior visit as a tapering 9 day dose.  She feels as if she hears water rushing in her left ear.  She has noticed some hearing loss.    She uses saline spray, which helps mucus come out.  This is thick.  She has had recurrent episodes of congestion.     Patient Active Problem List   Diagnosis Date Noted  . HIP PAIN, RIGHT 10/18/2009  . TROCHANTERIC BURSITIS 10/18/2009  . SUBSCAPULARIS SPRAIN AND STRAIN 02/21/2009  . 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 Persistent disorder of initiating or maintaining sleep 12/23/2006  . MENISCUS INJURY 12/23/2006    Past Medical History  Diagnosis Date  . Heart murmur     Past Surgical History  Procedure Laterality Date  . Knee surgery Right     Social History  Substance Use Topics  . Smoking status: Never Smoker   . Smokeless tobacco: Not on file  . Alcohol Use: 1.8 - 2.4 oz/week    3-4 Standard drinks or equivalent per week     Comment: 4 drinks a week    No family history on file.  Allergies  Allergen Reactions  . Sulfonamide Derivatives     Medication list has been reviewed and updated.  Current Outpatient Prescriptions on File Prior to Visit  Medication Sig Dispense Refill  . Calcium Carbonate-Vitamin D (CALCIUM 500 + D PO) Take by mouth daily.    . fluticasone (FLONASE) 50 MCG/ACT nasal spray Place 2 sprays into both nostrils daily. 16 g 6   No current facility-administered medications on file prior to visit.    ROS ROS otherwise unremarkable  unless listed above.    Physical Examination: BP 128/64 mmHg  Pulse 87  Temp(Src) 97.8 F (36.6 C) (Oral)  Resp 16  Ht 5\' 5"  (1.651 m)  Wt 129 lb (58.514 kg)  BMI 21.47 kg/m2  SpO2 98% Ideal Body Weight: Weight in (lb) to have BMI = 25: 149.9  Physical Exam  Constitutional: She is oriented to person, place, and time. She appears well-developed and well-nourished. No distress.  HENT:  Head: Normocephalic and atraumatic.  Right Ear: External ear and ear canal normal. Tympanic membrane is not injected.  Left Ear: External ear and ear canal normal. Tympanic membrane is not injected.  Nose: Mucosal edema and rhinorrhea present. Right sinus exhibits no maxillary sinus tenderness and no frontal sinus tenderness. Left sinus exhibits no maxillary sinus tenderness and no frontal sinus tenderness.  Mouth/Throat: No uvula swelling. No oropharyngeal exudate, posterior oropharyngeal edema or posterior oropharyngeal erythema.  Bilateral white mucus behind the tympanic membrane.  Eyes: Conjunctivae and EOM are normal. Pupils are equal, round, and reactive to light.  Cardiovascular: Normal rate and regular rhythm.  Exam reveals no gallop, no distant heart sounds and no friction rub.   No murmur heard. Pulmonary/Chest: Effort normal. No respiratory distress. She has no decreased breath sounds. She has no wheezes. She  has no rhonchi.  Lymphadenopathy:       Head (right side): No submandibular, no tonsillar, no preauricular and no posterior auricular adenopathy present.       Head (left side): No submandibular, no tonsillar, no preauricular and no posterior auricular adenopathy present.  Neurological: She is alert and oriented to person, place, and time.  Skin: She is not diaphoretic.  Psychiatric: She has a normal mood and affect. Her behavior is normal.     Assessment and Plan: Maelynn Pent is a 65 y.o. female who is here today for sinus congestion and pressure. Will treat with abx at this time.   Advised starting 2nd anti-histamine at this time.    Subacute sinusitis, unspecified location - Plan: Guaifenesin (MUCINEX MAXIMUM STRENGTH) 1200 MG TB12, cetirizine (ZYRTEC) 10 MG tablet, amoxicillin-clavulanate (AUGMENTIN) 875-125 MG tablet  Ivar Drape, PA-C [facility] 08/12/2015 5:32 PM

## 2015-08-13 ENCOUNTER — Encounter: Payer: Self-pay | Admitting: Physician Assistant

## 2015-09-16 ENCOUNTER — Encounter: Payer: Self-pay | Admitting: Sports Medicine

## 2015-09-16 ENCOUNTER — Ambulatory Visit (INDEPENDENT_AMBULATORY_CARE_PROVIDER_SITE_OTHER): Payer: BLUE CROSS/BLUE SHIELD | Admitting: Sports Medicine

## 2015-09-16 VITALS — BP 110/56 | HR 59 | Ht 65.0 in | Wt 125.0 lb

## 2015-09-16 DIAGNOSIS — M545 Low back pain, unspecified: Secondary | ICD-10-CM

## 2015-09-16 MED ORDER — MELOXICAM 15 MG PO TABS: 15.0000 mg | ORAL_TABLET | Freq: Every day | ORAL | Status: DC

## 2015-09-16 NOTE — Progress Notes (Signed)
   Subjective:    Patient ID: Nicole Benson, female    DOB: [birth year], 65 y.o.   MRN: [id number]  HPI chief complaint: Low back pain  Patient comes in today for follow-up on low back pain. She was last seen in our office in February 2016. An IM Depo-Medrol injection was administered and she was referred to [facility] for physical therapy. She did some physical therapy for a short time but her symptoms persisted. This led her to eventually seek treatment at [facility]. She saw Dr.Kendall and an MRI was ordered of her lumbar spine. The report is available for my review. She then saw Dr.Ramos for an epidural injection but she does not think that it was helpful. A second injection was scheduled but after waiting for over 2 hours in his office she decided to leave and has not been back. In the past, she has been very active with yoga but recently thought that she may be "overstretching" and has changed to Pilates. As a result, her symptoms have become much more tolerable. She has also found meloxicam to be extremely helpful and is requesting a refill on that today. When she does get pain, it is primarily in her coccyx. She does not get any radiating pain into her legs. No associated numbness or tingling. No weakness.  Interim medical history reviewed Medications reviewed Allergies reviewed    Review of Systems    as above Objective:   Physical Exam  Well-developed, well-nourished. No acute distress. Awake alert and oriented 3. Vital signs reviewed  Lumbar spine: Patient has full painless lumbar range of motion. No tenderness to palpation or percussion along the lumbar midline. No spasm. Negative straight leg raise bilaterally. Negative log roll bilaterally. Her strength is 5/5 in both lower extremities including resisted plantar flexion. Reflexes are brisk and equal at the Achilles and patellar tendons. Sensation intact to light touch grossly. Walking without a limp.  MRI of her  lumbar spine done a [facility] on 07/06/2015 is reviewed. She has a large leftward disc bulge at L5-S1 which does appear to displace the left S1 nerve root. Mild degenerative changes at L4-L5 as well.      Assessment & Plan:   Stable low back pain secondary to lumbar degenerative disc disease with MRI evidence of an L5-S1 disc bulge  Although the MRI shows a large leftward disc bulge at L5-S1, clinically the patient is not exhibiting any significant signs or symptoms of lumbar radiculopathy. I agree with her continuing with Pilates and I have refilled her meloxicam to take as needed. She may continue with activity using pain as her guide will follow-up with me as needed.

## 2015-09-17 ENCOUNTER — Encounter: Payer: Self-pay | Admitting: Sports Medicine

## 2015-10-05 ENCOUNTER — Other Ambulatory Visit: Payer: Self-pay | Admitting: Physician Assistant

## 2016-01-08 NOTE — Progress Notes (Deleted)
   Subjective:    Patient ID: Nicole Benson, female    DOB: 1951/01/03, 65 y.o.   MRN: [id number]  HPI  Nicole Benson is a 65 yo woman who is here today for a complete physical.  This is my first time meeting this patient.  Her last CPE was by a colleague at [facility] 1 yr prior.  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  Review of Systems     Objective:   Physical Exam        Assessment & Plan:  Repeat pap this yr or next yr and none further Ua, lipid, cbc, cmp, tsh.

## 2016-01-09 ENCOUNTER — Encounter: Payer: BLUE CROSS/BLUE SHIELD | Admitting: Family Medicine

## 2016-02-13 ENCOUNTER — Other Ambulatory Visit: Payer: Self-pay | Admitting: Physician Assistant

## 2016-02-13 ENCOUNTER — Other Ambulatory Visit: Payer: Self-pay | Admitting: Family Medicine

## 2016-02-13 ENCOUNTER — Other Ambulatory Visit: Payer: Self-pay | Admitting: Emergency Medicine

## 2016-02-13 DIAGNOSIS — Z1231 Encounter for screening mammogram for malignant neoplasm of breast: Secondary | ICD-10-CM

## 2016-03-27 ENCOUNTER — Ambulatory Visit
Admission: RE | Admit: 2016-03-27 | Discharge: 2016-03-27 | Disposition: A | Discharge status: Home / Self Care | Payer: BLUE CROSS/BLUE SHIELD | Source: Ambulatory Visit | Attending: Emergency Medicine | Admitting: Emergency Medicine

## 2016-03-27 DIAGNOSIS — Z1231 Encounter for screening mammogram for malignant neoplasm of breast: Secondary | ICD-10-CM

## 2016-10-23 DIAGNOSIS — H5213 Myopia, bilateral: Secondary | ICD-10-CM | POA: Diagnosis not present

## 2016-10-23 DIAGNOSIS — H25013 Cortical age-related cataract, bilateral: Secondary | ICD-10-CM | POA: Diagnosis not present

## 2016-10-23 DIAGNOSIS — H524 Presbyopia: Secondary | ICD-10-CM | POA: Diagnosis not present

## 2016-10-23 DIAGNOSIS — H04123 Dry eye syndrome of bilateral lacrimal glands: Secondary | ICD-10-CM | POA: Diagnosis not present

## 2016-10-23 DIAGNOSIS — H52223 Regular astigmatism, bilateral: Secondary | ICD-10-CM | POA: Diagnosis not present

## 2016-10-23 DIAGNOSIS — H11153 Pinguecula, bilateral: Secondary | ICD-10-CM | POA: Diagnosis not present

## 2016-11-03 DIAGNOSIS — Z6821 Body mass index (BMI) 21.0-21.9, adult: Secondary | ICD-10-CM | POA: Diagnosis not present

## 2016-11-03 DIAGNOSIS — Z01419 Encounter for gynecological examination (general) (routine) without abnormal findings: Secondary | ICD-10-CM | POA: Diagnosis not present

## 2016-11-06 DIAGNOSIS — M67441 Ganglion, right hand: Secondary | ICD-10-CM | POA: Diagnosis not present

## 2016-11-06 DIAGNOSIS — M67442 Ganglion, left hand: Secondary | ICD-10-CM | POA: Diagnosis not present

## 2016-11-29 NOTE — Progress Notes (Signed)
This encounter was created in error - please disregard.

## 2016-12-10 ENCOUNTER — Ambulatory Visit (INDEPENDENT_AMBULATORY_CARE_PROVIDER_SITE_OTHER): Payer: PPO | Admitting: Family Medicine

## 2016-12-10 ENCOUNTER — Encounter: Payer: Self-pay | Admitting: Family Medicine

## 2016-12-10 VITALS — BP 120/80 | HR 57 | Temp 98.1°F | Resp 16 | Ht 65.0 in | Wt 131.4 lb

## 2016-12-10 DIAGNOSIS — Z23 Encounter for immunization: Secondary | ICD-10-CM | POA: Diagnosis not present

## 2016-12-10 DIAGNOSIS — Z1211 Encounter for screening for malignant neoplasm of colon: Secondary | ICD-10-CM

## 2016-12-10 DIAGNOSIS — G8929 Other chronic pain: Secondary | ICD-10-CM

## 2016-12-10 DIAGNOSIS — M545 Low back pain, unspecified: Secondary | ICD-10-CM | POA: Insufficient documentation

## 2016-12-10 DIAGNOSIS — J309 Allergic rhinitis, unspecified: Secondary | ICD-10-CM | POA: Diagnosis not present

## 2016-12-10 MED ORDER — FLUTICASONE PROPIONATE 50 MCG/ACT NA SUSP: 2.0000 | Freq: Every day | NASAL | 6 refills | Status: DC

## 2016-12-10 NOTE — Assessment & Plan Note (Signed)
Pt's sxs and PE consistent w/ allergic rhinitis and inflammation.  Start daily nasal steroid.  Discuss allergy avoidance if possible.  Reviewed supportive care and red flags that should prompt return.  Pt expressed understanding and is in agreement w/ plan.

## 2016-12-10 NOTE — Patient Instructions (Signed)
Schedule your Welcome to Medicare Physical in 4-6 months Start the Flonase- 2 sprays each nostril daily- for the seasonal allergies We'll call you with your GI appt Good luck with your upcoming ganglion cyst removal and back trial Call with any questions or concerns Welcome!  We're glad to have you!

## 2016-12-10 NOTE — Assessment & Plan Note (Signed)
New to provider, ongoing for pt.  She is enrolling in a clinical trial for back pain out of [place].  She has been previously told she is not a surgical candidate.  Will follow along and assist as able.

## 2016-12-10 NOTE — Progress Notes (Signed)
   Subjective:    Patient ID: Nicole Benson, female    DOB: 02/15/1951, 66 y.o.   MRN: [id number]  HPI New to establish.  Previous MD- [facility]  Due for colonoscopy- needs referral to Dr Collene Mares  Due for Prevnar today  Frequent sinus infections- pt reports recurrent infections started a few years ago and 'it's hell'.  Pt reports nasal congestion, PND.  Pt is wondering if she has contributing seasonal allergies.  Chronic LBP- pt has back pain since 2016.  MRI shows 'degenerative changes of Lspine, most notably at L5/S1 there is a L paracentral disc extrusion which appears to abut the traversing L sided nerve roots which are displaced.  Mild central canal stenosis and moderate bilateral foraminal stenosis'.  Pt will be participating in a clinical trial b/c she was told that she was not a surgical candidate.  Pt has difficulty 'bending over but especially getting up'.  Has been seeing Bjorn Loser for PT.   Review of Systems For ROS see HPI     Objective:   Physical Exam  Constitutional: She is oriented to person, place, and time. She appears well-developed and well-nourished. No distress.  HENT:  Head: Normocephalic and atraumatic.  Right Ear: Tympanic membrane normal.  Left Ear: Tympanic membrane normal.  Nose: Mucosal edema and rhinorrhea present. Right sinus exhibits no maxillary sinus tenderness and no frontal sinus tenderness. Left sinus exhibits no maxillary sinus tenderness and no frontal sinus tenderness.  Mouth/Throat: Mucous membranes are normal. Posterior oropharyngeal erythema (w/ PND) present.  Eyes: Conjunctivae and EOM are normal. Pupils are equal, round, and reactive to light.  Neck: Normal range of motion. Neck supple.  Cardiovascular: Normal rate, regular rhythm and normal heart sounds.   Pulmonary/Chest: Effort normal and breath sounds normal. No respiratory distress. She has no wheezes. She has no rales.  Lymphadenopathy:    She has no cervical adenopathy.  Neurological:  She is alert and oriented to person, place, and time.  Skin: Skin is warm and dry.  Psychiatric: She has a normal mood and affect. Her behavior is normal. Thought content normal.  Vitals reviewed.         Assessment & Plan:

## 2016-12-10 NOTE — Addendum Note (Signed)
Addended by: Davis Gourd on: 12/10/2016 10:54 AM   Modules accepted: Orders

## 2016-12-10 NOTE — Progress Notes (Signed)
Pre visit review using our clinic review tool, if applicable. No additional management support is needed unless otherwise documented below in the visit note. 

## 2016-12-14 DIAGNOSIS — M67441 Ganglion, right hand: Secondary | ICD-10-CM | POA: Diagnosis not present

## 2016-12-14 DIAGNOSIS — D481 Neoplasm of uncertain behavior of connective and other soft tissue: Secondary | ICD-10-CM | POA: Diagnosis not present

## 2016-12-14 DIAGNOSIS — M60241 Foreign body granuloma of soft tissue, not elsewhere classified, right hand: Secondary | ICD-10-CM | POA: Diagnosis not present

## 2016-12-17 ENCOUNTER — Encounter: Payer: Self-pay | Admitting: Gastroenterology

## 2016-12-24 DIAGNOSIS — D481 Neoplasm of uncertain behavior of connective and other soft tissue: Secondary | ICD-10-CM | POA: Diagnosis not present

## 2016-12-24 DIAGNOSIS — M60241 Foreign body granuloma of soft tissue, not elsewhere classified, right hand: Secondary | ICD-10-CM | POA: Diagnosis not present

## 2017-02-04 ENCOUNTER — Encounter: Payer: Self-pay | Admitting: Gastroenterology

## 2017-02-04 ENCOUNTER — Ambulatory Visit (AMBULATORY_SURGERY_CENTER): Payer: Self-pay

## 2017-02-04 VITALS — Ht 65.0 in | Wt 132.4 lb

## 2017-02-04 DIAGNOSIS — Z800 Family history of malignant neoplasm of digestive organs: Secondary | ICD-10-CM

## 2017-02-04 MED ORDER — SUPREP BOWEL PREP KIT 17.5-3.13-1.6 GM/177ML PO SOLN: 1.0000 | Freq: Once | ORAL | 0 refills | Status: AC

## 2017-02-04 NOTE — Progress Notes (Signed)
No allergies to eggs or soy No diet meds No home oxygen No past problems with anesthesia  Declined emmi 

## 2017-02-18 ENCOUNTER — Encounter: Payer: Self-pay | Admitting: Gastroenterology

## 2017-02-18 ENCOUNTER — Ambulatory Visit (AMBULATORY_SURGERY_CENTER): Payer: PPO | Admitting: Gastroenterology

## 2017-02-18 VITALS — BP 113/72 | HR 58 | Temp 98.0°F | Resp 16 | Ht 65.0 in | Wt 131.0 lb

## 2017-02-18 DIAGNOSIS — Z1212 Encounter for screening for malignant neoplasm of rectum: Secondary | ICD-10-CM

## 2017-02-18 DIAGNOSIS — Z1211 Encounter for screening for malignant neoplasm of colon: Secondary | ICD-10-CM | POA: Diagnosis not present

## 2017-02-18 MED ORDER — SODIUM CHLORIDE 0.9 % IV SOLN: 500.0000 mL | INTRAVENOUS | Status: DC

## 2017-02-18 NOTE — Op Note (Signed)
[facility] Patient Name: Nicole Benson Procedure Date: 02/18/2017 9:02 AM MRN: [id number] Endoscopist: Mallie Mussel L. Loletha Carrow , MD Age: 66 Referring MD:  Date of Birth: 09-Jan-1951 Gender: Female Account #: [id number] Procedure:                Colonoscopy Indications:              Screening for colorectal malignant neoplasm                            (reportedly no polyps on 2007 colonoscopy at                            outside institution) Medicines:                Monitored Anesthesia Care Procedure:                Pre-Anesthesia Assessment:                           - Prior to the procedure, a History and Physical                            was performed, and patient medications and                            allergies were reviewed. The patient's tolerance of                            previous anesthesia was also reviewed. The risks                            and benefits of the procedure and the sedation                            options and risks were discussed with the patient.                            All questions were answered, and informed consent                            was obtained. Prior Anticoagulants: The patient has                            taken no previous anticoagulant or antiplatelet                            agents. ASA Grade Assessment: II - A patient with                            mild systemic disease. After reviewing the risks                            and benefits, the patient was deemed in  satisfactory condition to undergo the procedure.                           After obtaining informed consent, the colonoscope                            was passed under direct vision. Throughout the                            procedure, the patient's blood pressure, pulse, and                            oxygen saturations were monitored continuously. The                            Colonoscope was introduced through the anus and                          advanced to the the cecum, identified by                            appendiceal orifice and ileocecal valve. The                            colonoscopy was performed without difficulty. The                            patient tolerated the procedure well. The quality                            of the bowel preparation was excellent. The                            ileocecal valve, appendiceal orifice, and rectum                            were photographed. The quality of the bowel                            preparation was evaluated using the BBPS [facility]                            Bowel Preparation Scale) with scores of: Right                            Colon = 3, Transverse Colon = 3 and Left Colon = 3                            (entire mucosa seen well with no residual staining,                            small fragments of stool or opaque liquid). The  total BBPS score equals 9. The bowel preparation                            used was SUPREP. Scope In: 9:09:10 AM Scope Out: 9:25:38 AM Scope Withdrawal Time: 0 hours 12 minutes 13 seconds  Total Procedure Duration: 0 hours 16 minutes 28 seconds  Findings:                 The perianal and digital rectal examinations were                            normal.                           The colon (entire examined portion) was tortuous                            and redundant, requiring external pressure to                            assist scope advancement.                           The exam was otherwise without abnormality on                            direct and retroflexion views. Complications:            No immediate complications. Estimated Blood Loss:     Estimated blood loss: none. Impression:               - Redundant colon.                           - The examination was otherwise normal on direct                            and retroflexion views.                           - No  specimens collected. Recommendation:           - Patient has a contact number available for                            emergencies. The signs and symptoms of potential                            delayed complications were discussed with the                            patient. Return to normal activities tomorrow.                            Written discharge instructions were provided to the                            patient.                           -  Resume previous diet.                           - Continue present medications.                           - Repeat colonoscopy in 10 years for screening                            purposes. Henry L. Loletha Carrow, MD 02/18/2017 9:30:46 AM This report has been signed electronically.

## 2017-02-18 NOTE — Progress Notes (Signed)
Report given to PACU, vss 

## 2017-02-18 NOTE — Progress Notes (Signed)
Pt's states no medical or surgical changes since previsit or office visit. maw 

## 2017-02-18 NOTE — Patient Instructions (Signed)
YOU HAD AN ENDOSCOPIC PROCEDURE TODAY AT THE [facility]:   Refer to the procedure report that was given to you for any specific questions about what was found during the examination.  If the procedure report does not answer your questions, please call your gastroenterologist to clarify.  If you requested that your care partner not be given the details of your procedure findings, then the procedure report has been included in a sealed envelope for you to review at your convenience later.  YOU SHOULD EXPECT: Some feelings of bloating in the abdomen. Passage of more gas than usual.  Walking can help get rid of the air that was put into your GI tract during the procedure and reduce the bloating. If you had a lower endoscopy (such as a colonoscopy or flexible sigmoidoscopy) you may notice spotting of blood in your stool or on the toilet paper. If you underwent a bowel prep for your procedure, you may not have a normal bowel movement for a few days.  Please Note:  You might notice some irritation and congestion in your nose or some drainage.  This is from the oxygen used during your procedure.  There is no need for concern and it should clear up in a day or so.  SYMPTOMS TO REPORT IMMEDIATELY:   Following lower endoscopy (colonoscopy or flexible sigmoidoscopy):  Excessive amounts of blood in the stool  Significant tenderness or worsening of abdominal pains  Swelling of the abdomen that is new, acute  Fever of 100F or higher   For urgent or emergent issues, a gastroenterologist can be reached at any hour by calling [phone].   DIET:  We do recommend a small meal at first, but then you may proceed to your regular diet.  Drink plenty of fluids but you should avoid alcoholic beverages for 24 hours.  ACTIVITY:  You should plan to take it easy for the rest of today and you should NOT DRIVE or use heavy machinery until tomorrow (because of the sedation medicines used during the test).     FOLLOW UP: Our staff will call the number listed on your records the next business day following your procedure to check on you and address any questions or concerns that you may have regarding the information given to you following your procedure. If we do not reach you, we will leave a message.  However, if you are feeling well and you are not experiencing any problems, there is no need to return our call.  We will assume that you have returned to your regular daily activities without incident.   SIGNATURES/CONFIDENTIALITY: You and/or your care partner have signed paperwork which will be entered into your electronic medical record.  These signatures attest to the fact that that the information above on your After Visit Summary has been reviewed and is understood.  Full responsibility of the confidentiality of this discharge information lies with you and/or your care-partner.  Read all handouts given to you by your recovery room nurse. 

## 2017-02-19 ENCOUNTER — Telehealth: Payer: Self-pay | Admitting: *Deleted

## 2017-02-19 NOTE — Telephone Encounter (Signed)
  Follow up Call-  Call back number 02/18/2017  Post procedure Call Back phone  # [phone] cell  Permission to leave phone message Yes  Some recent data might be hidden     Patient questions:  Do you have a fever, pain , or abdominal swelling? No. Pain Score  0 *  Have you tolerated food without any problems? Yes.    Have you been able to return to your normal activities? Yes.    Do you have any questions about your discharge instructions: Diet   No. Medications  No. Follow up visit  No.  Do you have questions or concerns about your Care? No.  Actions: * If pain score is 4 or above: No action needed, pain <4.

## 2017-02-22 ENCOUNTER — Other Ambulatory Visit: Payer: Self-pay | Admitting: Family Medicine

## 2017-02-22 DIAGNOSIS — Z1231 Encounter for screening mammogram for malignant neoplasm of breast: Secondary | ICD-10-CM

## 2017-03-08 ENCOUNTER — Other Ambulatory Visit: Payer: Self-pay | Admitting: Obstetrics and Gynecology

## 2017-03-08 DIAGNOSIS — E2839 Other primary ovarian failure: Secondary | ICD-10-CM

## 2017-03-29 ENCOUNTER — Ambulatory Visit: Payer: PPO

## 2017-03-29 ENCOUNTER — Ambulatory Visit
Admission: RE | Admit: 2017-03-29 | Discharge: 2017-03-29 | Disposition: A | Discharge status: Home / Self Care | Payer: PPO | Source: Ambulatory Visit | Attending: Family Medicine | Admitting: Family Medicine

## 2017-03-29 ENCOUNTER — Ambulatory Visit (INDEPENDENT_AMBULATORY_CARE_PROVIDER_SITE_OTHER): Payer: PPO | Admitting: Sports Medicine

## 2017-03-29 ENCOUNTER — Encounter: Payer: Self-pay | Admitting: Sports Medicine

## 2017-03-29 ENCOUNTER — Ambulatory Visit
Admission: RE | Admit: 2017-03-29 | Discharge: 2017-03-29 | Disposition: A | Discharge status: Home / Self Care | Payer: PPO | Source: Ambulatory Visit | Attending: Obstetrics and Gynecology | Admitting: Obstetrics and Gynecology

## 2017-03-29 ENCOUNTER — Encounter: Payer: Self-pay | Admitting: General Practice

## 2017-03-29 VITALS — BP 110/66 | HR 57 | Ht 65.0 in | Wt 130.2 lb

## 2017-03-29 DIAGNOSIS — E2839 Other primary ovarian failure: Secondary | ICD-10-CM

## 2017-03-29 DIAGNOSIS — Z1231 Encounter for screening mammogram for malignant neoplasm of breast: Secondary | ICD-10-CM | POA: Diagnosis not present

## 2017-03-29 DIAGNOSIS — M545 Low back pain: Secondary | ICD-10-CM | POA: Diagnosis not present

## 2017-03-29 DIAGNOSIS — M2550 Pain in unspecified joint: Secondary | ICD-10-CM

## 2017-03-29 DIAGNOSIS — M8589 Other specified disorders of bone density and structure, multiple sites: Secondary | ICD-10-CM | POA: Diagnosis not present

## 2017-03-29 DIAGNOSIS — Z780 Asymptomatic menopausal state: Secondary | ICD-10-CM | POA: Diagnosis not present

## 2017-03-29 DIAGNOSIS — G8929 Other chronic pain: Secondary | ICD-10-CM | POA: Diagnosis not present

## 2017-03-29 LAB — CBC WITH DIFFERENTIAL/PLATELET
Basophils Absolute: 0 10*3/uL (ref 0.0–0.1)
Basophils Relative: 0.7 % (ref 0.0–3.0)
Eosinophils Absolute: 0.1 10*3/uL (ref 0.0–0.7)
Eosinophils Relative: 3.3 % (ref 0.0–5.0)
HCT: 41.7 % (ref 36.0–46.0)
Hemoglobin: 14 g/dL (ref 12.0–15.0)
Lymphocytes Relative: 30.1 % (ref 12.0–46.0)
Lymphs Abs: 1.3 10*3/uL (ref 0.7–4.0)
MCHC: 33.4 g/dL (ref 30.0–36.0)
MCV: 90.9 fl (ref 78.0–100.0)
Monocytes Absolute: 0.3 10*3/uL (ref 0.1–1.0)
Monocytes Relative: 7.8 % (ref 3.0–12.0)
Neutro Abs: 2.5 10*3/uL (ref 1.4–7.7)
Neutrophils Relative %: 58.1 % (ref 43.0–77.0)
Platelets: 130 10*3/uL — ABNORMAL LOW (ref 150.0–400.0)
RBC: 4.59 Mil/uL (ref 3.87–5.11)
RDW: 13.4 % (ref 11.5–15.5)
WBC: 4.2 10*3/uL (ref 4.0–10.5)

## 2017-03-29 LAB — COMPREHENSIVE METABOLIC PANEL
ALT: 16 U/L (ref 0–35)
AST: 18 U/L (ref 0–37)
Albumin: 4.3 g/dL (ref 3.5–5.2)
Alkaline Phosphatase: 62 U/L (ref 39–117)
BUN: 17 mg/dL (ref 6–23)
CHLORIDE: 101 meq/L (ref 96–112)
CO2: 34 meq/L — AB (ref 19–32)
CREATININE: 0.79 mg/dL (ref 0.40–1.20)
Calcium: 9.6 mg/dL (ref 8.4–10.5)
GFR: 77.54 mL/min (ref >60.00)
Glucose, Bld: 144 mg/dL — ABNORMAL HIGH (ref 70–99)
POTASSIUM: 4.3 meq/L (ref 3.5–5.1)
SODIUM: 142 meq/L (ref 135–145)
Total Bilirubin: 1 mg/dL (ref 0.2–1.2)
Total Protein: 6.5 g/dL (ref 6.0–8.3)

## 2017-03-29 LAB — SEDIMENTATION RATE: Sed Rate: 1 mm/hr (ref 0–30)

## 2017-03-29 LAB — C-REACTIVE PROTEIN: CRP: 0.1 mg/dL — ABNORMAL LOW (ref 0.5–20.0)

## 2017-03-29 LAB — HM PAP SMEAR

## 2017-03-29 LAB — VITAMIN D 25 HYDROXY (VIT D DEFICIENCY, FRACTURES): VITD: 42.46 ng/mL (ref 30.00–100.00)

## 2017-03-29 NOTE — Progress Notes (Addendum)
OFFICE VISIT NOTE Nicole Benson. Glennie Rodda, Nicole Benson at [facility] - 66 y.o. female MRN [id number]  Date of birth: 1950/12/17  Visit Date: 03/29/2017  PCP: Midge Minium, MD   Referred by: Midge Minium, MD  Burlene Arnt, CMA acting as scribe for Dr. Paulla Fore.  SUBJECTIVE:   Chief Complaint  Patient presents with  . New Patient (Initial Visit)    low back pain   HPI: As below and per problem based documentation when appropriate.  Nicole Benson is a new patient presenting today for evaluation of lower back pain.  Lower back pain has been present x 2 years.  She has hx of back spasms which started in 2010 but no known injury or trauma. Chronic lower back pain started after back spasm in 2016.   The pain is described as severe aching in the morning and is rated as 6-7/10 in the morning but 1/10 during the day.  Worsened with bending when first getting up in the morning. Pain seems to be worse after being inactive. Pain is worse after walking on uneven ground. She was walking on uneven ground and when she stepped she felt a sharp pain in her back. She is a Science writer.  Improves with activity. Road biking seems to help with the pain. She does Pilates and Yoga.  Therapies tried include : IM DepoMedrol injection (Dr. Micheline Chapman), PT (John O'Halloran, [facility], [facility]), spinal epidural inj Nelva Bush), healing hands chiropractor, massage therapy Daphene Jaeger). She has tried anti-inflammatories diets with no relief. She has tried taking Meloxicam, Arthritis Pain reliever, Acetaminophen ER.   Other associated symptoms include: The pain does not radiates into the legs. Pain is bilateral. She denies loss of bladder or bowel control.   Pt was recently involved in a trial with [employer], percutaneous peripheral nerve stimulation for the treatment of back pain.     Review of Systems    Constitutional: Negative for chills and fever.  Respiratory: Negative for shortness of breath and wheezing.   Cardiovascular: Negative for chest pain and palpitations.  Gastrointestinal: Negative.   Genitourinary: Negative.   Musculoskeletal: Positive for back pain.  Neurological: Negative for dizziness, tingling and headaches.    Otherwise per HPI.   HISTORY & PERTINENT PRIOR DATA:  Prior History reviewed and updated per electronic medical record.  Significant history, findings, studies and interim changes include:  reports that  has never smoked. she has never used smokeless tobacco. No results for input(s): HGBA1C, LABURIC, CREATINE in the last 8760 hours. No specialty comments available. Problem  Polyarthralgia  Low Back Pain   MRI from July 08, 2015:  L5-S1 disc bulge with superimposed left central posterior lateral caudal disc extrusion which displaces the left S1 nerve root  L4-5 with mild left lateral recess stenosis with possible encroachment on the left L5 root  She is status post percutaneous needle electrical stimulation into the multifidus through trial in [place] that provided moderate to good relief but the probes have had to be removed.  Has consider radiofrequency ablation      OBJECTIVE:  VS:  HT:5\' 5"  (165.1 cm)   WT:130 lb 3.2 oz (59.1 kg)  BMI:21.67    BP:110/66  HR:(!) 57bpm  TEMP: ( )  RESP:96 %  PHYSICAL EXAM: Constitutional: WDWN, Non-toxic appearing. Psychiatric: Alert & appropriately interactive. Not depressed or anxious appearing. Respiratory: No increased work of breathing. Trachea Midline Eyes: Pupils  are equal. EOM intact without nystagmus. No scleral icterus Cardiovascular:  Peripheral Pulses: peripheral pulses symmetrical No clubbing or cyanosis appreciated Capillary Refill is normal, less than 2 seconds No signficant generalized edema/anasarca Sensory Exam: intact to light touch  Back & Lower Extremities:  Bilateral  negative straight leg raise.  No significant midline tenderness.    TPP over her bilateral paraspinal musculature.  Good internal and external rotation of the hips.  Patient is able to heel and toe walk without significant difficulty.  Manual muscle testing is 5+/5 in BLE myotomes without focality  Lower extremity DTRs 2+/4 diffusely and symmetric    No additional findings.   ASSESSMENT & PLAN:   1. Chronic bilateral low back pain without sciatica   2. Polyarthralgia    PLAN:    Low back pain Quite complicated case given the percutaneous treatment.  Ultimately I believe the majority of her back pain is coming from core instability and will have her begin working on therapeutic exercises as reviewed below.   I suspect significant hip flexor and anterior chain dominance that is contributing to this.  We will have her begin performing home therapeutic exercises and will plan to follow-up with her in 6-8 weeks to ensure clinical improvement.  If any lack of improvement can consider referral for radiofrequency ablation at that time  ++++++++++++++++++++++++++++++++++++++++++++ PROCEDURE NOTE: THERAPEUTIC EXERCISES (97110) 15 minutes spent for Therapeutic exercises as below and as referenced in the AVS. This included exercises focusing on stretching, strengthening, with significant focus on eccentric aspects.  Proper technique shown and discussed handout in great detail with ATC. All questions were discussed and answered.   Long term goals include an improvement in range of motion, strength, endurance as well as avoiding reinjury. Frequency of visits is one time as determined during today's  office visit. Frequency of exercises to be performed is as per handout.  EXERCISES REVIEWED:  Core stabilization, hip flexor stretching and strengthening and Goodman exercises   Polyarthralgia Given the significant polyarthralgia complaints of multiple peripheral joints and axial spine further  evaluation with rheumatologic panel recommended and the patient is agreeable.  Will call with any abnormal labs to discuss this   >50% of this 45 minute visit spent in direct patient counseling and/or coordination of care.  Discussion was focused on education regarding the in discussing the pathoetiology and anticipated clinical course of the above condition.  ++++++++++++++++++++++++++++++++++++++++++++ Orders & Meds: Orders Placed This Encounter  Procedures  . CBC with Differential/Platelet  . Comprehensive metabolic panel  . VITAMIN D 25 Hydroxy (Vit-D Deficiency, Fractures)  . C-reactive protein  . Sedimentation rate  . Rheumatoid factor  . Cyclic citrul peptide antibody, IgG  . ANA  . Anti-nuclear ab-titer (ANA titer)    No orders of the defined types were placed in this encounter.   ++++++++++++++++++++++++++++++++++++++++++++ Follow-up: Return in about 6 weeks (around 05/10/2017).   Pertinent documentation may be included in additional procedure notes, imaging studies, problem based documentation and patient instructions. Please see these sections of the encounter for additional information regarding this visit. CMA/ATC served as Education administrator during this visit. History, Physical, and Plan performed by medical provider. Documentation and orders reviewed and attested to.      Gerda Diss, [facility] Physician

## 2017-03-29 NOTE — Patient Instructions (Addendum)
Also check out [employer]" which is a program developed by Dr. Minerva Ends.   There are links to a couple of his YouTube Videos below and I would like to see performing one of his videos 5-6 days per week.    A good intro video is: "Independence from Pain 7-minute Video" - [email or web address]   His more advanced video is: "Powerful Posture and Pain Relief: 12 minutes of Foundation Training" - https://youtu.be/[email or web address]  Do not try to attempt this entire video when first beginning.    Try breaking of each exercise that he goes into shorter segments.  Otherwise if they perform an exercise for 45 seconds, start with 15 seconds and rest and then resume when they begin the new activity.    If you work your way up to doing this 12 minute video, I expect you will see significant improvements in your pain.  If you enjoy his videos and would like to find out more you can look on his website: [email or web address]  He has a workout streaming option as well as a DVD set available for purchase.  Amazon has the best price for his DVDs.     Please perform the exercise program that we have prepared for you and gone over in detail on a daily basis.  In addition to the handout you were provided you can access your program through: [email or web address]   Your unique program code is: [email or web address]

## 2017-03-31 LAB — RHEUMATOID FACTOR

## 2017-03-31 LAB — CYCLIC CITRUL PEPTIDE ANTIBODY, IGG

## 2017-03-31 LAB — ANTI-NUCLEAR AB-TITER (ANA TITER)

## 2017-03-31 LAB — ANA: Anti Nuclear Antibody(ANA): POSITIVE — AB

## 2017-04-01 ENCOUNTER — Encounter: Payer: Self-pay | Admitting: Sports Medicine

## 2017-04-28 ENCOUNTER — Encounter: Payer: PPO | Admitting: Family Medicine

## 2017-04-29 ENCOUNTER — Ambulatory Visit (INDEPENDENT_AMBULATORY_CARE_PROVIDER_SITE_OTHER): Payer: PPO | Admitting: Family Medicine

## 2017-04-29 ENCOUNTER — Encounter: Payer: Self-pay | Admitting: Family Medicine

## 2017-04-29 ENCOUNTER — Other Ambulatory Visit: Payer: Self-pay

## 2017-04-29 VITALS — BP 110/68 | HR 78 | Temp 98.1°F | Resp 16 | Ht 65.0 in | Wt 132.0 lb

## 2017-04-29 DIAGNOSIS — Z0000 Encounter for general adult medical examination without abnormal findings: Secondary | ICD-10-CM | POA: Diagnosis not present

## 2017-04-29 DIAGNOSIS — E785 Hyperlipidemia, unspecified: Secondary | ICD-10-CM

## 2017-04-29 DIAGNOSIS — Z23 Encounter for immunization: Secondary | ICD-10-CM | POA: Diagnosis not present

## 2017-04-29 DIAGNOSIS — H9193 Unspecified hearing loss, bilateral: Secondary | ICD-10-CM | POA: Diagnosis not present

## 2017-04-29 LAB — BASIC METABOLIC PANEL
BUN: 16 mg/dL (ref 6–23)
CHLORIDE: 100 meq/L (ref 96–112)
CO2: 32 meq/L (ref 19–32)
Calcium: 9.5 mg/dL (ref 8.4–10.5)
Creatinine, Ser: 0.79 mg/dL (ref 0.40–1.20)
GFR: 77.52 mL/min (ref >60.00)
GLUCOSE: 90 mg/dL (ref 70–99)
Potassium: 3.7 mEq/L (ref 3.5–5.1)
SODIUM: 138 meq/L (ref 135–145)

## 2017-04-29 LAB — LIPID PANEL
CHOLESTEROL: 258 mg/dL — AB (ref 0–200)
HDL: 100 mg/dL (ref >39.00)
LDL Cholesterol: 149 mg/dL — ABNORMAL HIGH (ref 0–99)
NONHDL: 158.35
Total CHOL/HDL Ratio: 3
Triglycerides: 47 mg/dL (ref 0.0–149.0)
VLDL: 9.4 mg/dL (ref 0.0–40.0)

## 2017-04-29 LAB — CBC WITH DIFFERENTIAL/PLATELET
BASOS ABS: 0 10*3/uL (ref 0.0–0.1)
BASOS PCT: 0.8 % (ref 0.0–3.0)
Eosinophils Absolute: 0.1 10*3/uL (ref 0.0–0.7)
Eosinophils Relative: 1.5 % (ref 0.0–5.0)
HEMATOCRIT: 44.4 % (ref 36.0–46.0)
Hemoglobin: 14.5 g/dL (ref 12.0–15.0)
LYMPHS ABS: 1.4 10*3/uL (ref 0.7–4.0)
LYMPHS PCT: 31.1 % (ref 12.0–46.0)
MCHC: 32.7 g/dL (ref 30.0–36.0)
MCV: 91.5 fl (ref 78.0–100.0)
MONOS PCT: 7.8 % (ref 3.0–12.0)
Monocytes Absolute: 0.3 10*3/uL (ref 0.1–1.0)
NEUTROS ABS: 2.6 10*3/uL (ref 1.4–7.7)
NEUTROS PCT: 58.8 % (ref 43.0–77.0)
PLATELETS: 132 10*3/uL — AB (ref 150.0–400.0)
RBC: 4.86 Mil/uL (ref 3.87–5.11)
RDW: 13.6 % (ref 11.5–15.5)
WBC: 4.3 10*3/uL (ref 4.0–10.5)

## 2017-04-29 LAB — HEPATIC FUNCTION PANEL
ALK PHOS: 62 U/L (ref 39–117)
ALT: 16 U/L (ref 0–35)
AST: 20 U/L (ref 0–37)
Albumin: 4.6 g/dL (ref 3.5–5.2)
BILIRUBIN DIRECT: 0.3 mg/dL (ref 0.0–0.3)
BILIRUBIN TOTAL: 1.8 mg/dL — AB (ref 0.2–1.2)
TOTAL PROTEIN: 7 g/dL (ref 6.0–8.3)

## 2017-04-29 LAB — TSH: TSH: 0.83 u[IU]/mL (ref 0.35–4.50)

## 2017-04-29 NOTE — Patient Instructions (Signed)
Follow up in 1 year or as needed We'll notify you of your lab results and make any changes if needed Continue to work on healthy diet and regular exercise- you look great!!! If you hit a wall with your back pain, let me know and we can refer to [facility] We'll call you with your audiology appt Add daily Claritin or Zyrtec to help w/ your nasal congestion Call with any questions or concerns Happy Thanksgiving!!!

## 2017-04-29 NOTE — Progress Notes (Signed)
   Subjective:    Patient ID: Nicole Benson, female    DOB: 11-08-1950, 66 y.o.   MRN: [id number]  HPI Here today for Welcome To Medicare CPE.  Risk Factors: Hyperlipidemia- last LDL was 134.  Attempting to control w/ diet and exercise Physical Activity: exercising regularly, goes to gym and bikes Fall Risk: low Depression: denies  Hearing: decreased hearing, asking for audiology referral ADL's: independent Cognitive: normal linear thought process, memory and attention intact Home Safety: safe at home, lives w/ husband Height, Weight, BMI, Visual Acuity: see vitals, vision corrected to 20/20 w/ glasses Counseling: UTD on pap, mammo, colonoscopy, DEXA, Prevnar.  Due for flu Opiate Risk: not on controlled substances, low risk Living Will/POA: pt has both of these Labs Ordered: See A&P Care Plan: See A&P    Review of Systems Patient reports no vision changes, adenopathy,fever, weight change,  persistant/recurrent hoarseness , swallowing issues, chest pain, palpitations, edema, persistant/recurrent cough, hemoptysis, dyspnea (rest/exertional/paroxysmal nocturnal), gastrointestinal bleeding (melena, rectal bleeding), abdominal pain, significant heartburn, bowel changes, GU symptoms (dysuria, hematuria, incontinence), Gyn symptoms (abnormal  bleeding, pain),  syncope, focal weakness, memory loss, numbness & tingling, skin/hair/nail changes, abnormal bruising or bleeding, anxiety, or depression.   Decreased hearing bilaterally    Objective:   Physical Exam General Appearance:    Alert, cooperative, no distress, appears stated age  Head:    Normocephalic, without obvious abnormality, atraumatic  Eyes:    PERRL, conjunctiva/corneas clear, EOM's intact, fundi    benign, both eyes  Ears:    Normal TM's and external ear canals, both ears  Nose:   Nares normal, septum midline, mucosa normal, no drainage    or sinus tenderness  Throat:   Lips, mucosa, and tongue normal; teeth and gums normal    Neck:   Supple, symmetrical, trachea midline, no adenopathy;    Thyroid: no enlargement/tenderness/nodules  Back:     Symmetric, no curvature, ROM normal, no CVA tenderness  Lungs:     Clear to auscultation bilaterally, respirations unlabored  Chest Wall:    No tenderness or deformity   Heart:    Regular rate and rhythm, S1 and S2 normal, no murmur, rub   or gallop  Breast Exam:    Deferred to GYN  Abdomen:     Soft, non-tender, bowel sounds active all four quadrants,    no masses, no organomegaly  Genitalia:    Deferred to GYN  Rectal:    Extremities:   Extremities normal, atraumatic, no cyanosis or edema  Pulses:   2+ and symmetric all extremities  Skin:   Skin color, texture, turgor normal, no rashes or lesions  Lymph nodes:   Cervical, supraclavicular, and axillary nodes normal  Neurologic:   CNII-XII intact, normal strength, sensation and reflexes    throughout          Assessment & Plan:

## 2017-04-29 NOTE — Assessment & Plan Note (Signed)
Pt's PE WNL.  UTD on GYN, colonoscopy, immunizations (flu given today).  Due to decreased hearing, will refer to audiology.  Check labs.  Anticipatory guidance provided.

## 2017-04-30 ENCOUNTER — Encounter: Payer: Self-pay | Admitting: General Practice

## 2017-05-10 ENCOUNTER — Ambulatory Visit (INDEPENDENT_AMBULATORY_CARE_PROVIDER_SITE_OTHER): Payer: PPO | Admitting: Sports Medicine

## 2017-05-10 ENCOUNTER — Encounter: Payer: Self-pay | Admitting: Sports Medicine

## 2017-05-10 DIAGNOSIS — M545 Low back pain: Secondary | ICD-10-CM

## 2017-05-10 DIAGNOSIS — G8929 Other chronic pain: Secondary | ICD-10-CM | POA: Diagnosis not present

## 2017-05-10 MED ORDER — CELECOXIB 100 MG PO CAPS: 100.0000 mg | ORAL_CAPSULE | Freq: Two times a day (BID) | ORAL | 2 refills | Status: DC | PRN

## 2017-05-10 NOTE — Progress Notes (Signed)
Nicole Benson. Nicole Benson, [place] [facility] [place] - 66 y.o. female MRN [id number]  Date of birth: [birth year]   Scribe for today's visit: Josepha Pigg, CMA    SUBJECTIVE:  Nicole Benson is here for  Follow-up (polyarthralgia)  Compared to the last office visit, her previously described symptoms show no change  Current symptoms are moderate & are nonradiating, stays in the lower back and achy joints in upper back and hips.  She has been working on [employer], home therapeutic exercises, and massage. She has tried Epidural injection and PT in the past. She take Meloxicam prn for pain.    ROS Reports night time disturbances. Denies fevers, chills, or night sweats. Denies unexplained weight loss. Denies personal history of cancer. Denies changes in bowel or bladder habits. Denies recent unreported falls. Denies new or worsening dyspnea or wheezing. Denies headaches or dizziness.  Denies numbness, tingling or weakness  In the extremities.  Denies dizziness or presyncopal episodes Denies lower extremity edema    HISTORY & PERTINENT PRIOR DATA:  Prior History reviewed and updated per electronic medical record. Significant history, findings, studies and interim changes include: No additional findings.  reports that  has never smoked. she has never used smokeless tobacco. No results for input(s): HGBA1C, LABURIC, CREATINE in the last 8760 hours. Problem  Low Back Pain   MRI from July 08, 2015:  L5-S1 disc bulge with superimposed left central posterior lateral caudal disc extrusion which displaces the left S1 nerve root  L4-5 with mild left lateral recess stenosis with possible encroachment on the left L5 root  She is status post percutaneous needle electrical stimulation into the multifidus through trial in [place] that provided moderate to good relief but the probes have had to be removed.  Referred to  Dr. Ernestina Patches for radiofrequency ablation      OBJECTIVE:  VS:  HT:5\' 5"  (165.1 cm)   WT:134 lb 12.8 oz (61.1 kg)  BMI:22.43    BP:106/70  HR:(!) 56bpm  TEMP: ( )  RESP:99 %  PHYSICAL EXAM: Constitutional: WDWN, Non-toxic appearing. Psychiatric: Alert & appropriately interactive.Not depressed or anxious appearing. Respiratory: No increased work of breathing. Trachea Midline Eyes: Pupils are equal. EOM intact without nystagmus. No scleral icterus  Bilateral lower extremity NEUROVASCULAR exam: No clubbing or cyanosis appreciated No significant pretibial/lower extremity peripheral edema No significant venous stasis changes Capillary Refill: normal, less than 2 seconds  Pedal Pulses: symmetrically palpable  Sensation in examined extremities: Intact to light touch in all dermatomes  Back & Lower Extremities:  Bilateral negative straight leg raise.  No significant midline tenderness.    Generalized TTP of her right greater than left L5-S1.  She has worsening pain with lumbar extension.  Good internal and external rotation of the hips.  Patient is able to heel and toe walk without significant difficulty.  Manual muscle testing is 5+/5 in BLE myotomes without focality  Lower extremity DTRs 2+/4 diffusely and symmetric   ASSESSMENT & PLAN:   1. Chronic bilateral low back pain without sciatica    Plan: She has undergone a fairly extensive workup for low back pain in the symptoms seem to be mainly facet mediated at this time.  Referral placed to Dr. Ernestina Patches for lumbar RFA.  Patient was asked to obtain a recent copy of her most recent MRI.  No problem-specific Assessment & Plan notes found for this encounter.   ++++++++++++++++++++++++++++++++++++++++++++ Orders:  Orders Placed This  Encounter  Procedures  . Ambulatory referral to Physical Medicine Rehab    Meds:  Meds ordered this encounter  Medications  . celecoxib (CELEBREX) 100 MG capsule    Sig: Take 1 capsule (100  mg total) by mouth 2 (two) times daily as needed.    Dispense:  60 capsule    Refill:  2    ++++++++++++++++++++++++++++++++++++++++++++ Follow-up: Return if symptoms worsen or fail to improve.   Pertinent documentation may be included in additional procedure notes, imaging studies, problem based documentation and patient instructions. Please see these sections of the encounter for additional information regarding this visit. CMA/ATC served as Education administrator during this visit. History, Physical, and Plan performed by medical provider. Documentation and orders reviewed and attested to.      August Albino Sports Medicine Physician    05/10/2017 9:29 AM

## 2017-05-10 NOTE — Patient Instructions (Signed)
It was good to see you. We are referring you to Dr. Laurence Spates at [facility] orth[facility].  They should be calling you in the next 2-3 days.  We have also sent in a prescription for Celebrex.  If you notice any type of rash please discontinue this medicine immediately and give me a call.  If you are having any persistent symptoms following the procedure I am happy to see you back at any time.

## 2017-05-25 ENCOUNTER — Institutional Professional Consult (permissible substitution) (INDEPENDENT_AMBULATORY_CARE_PROVIDER_SITE_OTHER): Payer: BLUE CROSS/BLUE SHIELD | Admitting: Physical Medicine and Rehabilitation

## 2017-05-26 ENCOUNTER — Encounter: Payer: Self-pay | Admitting: Sports Medicine

## 2017-05-26 NOTE — Assessment & Plan Note (Signed)
Given the significant polyarthralgia complaints of multiple peripheral joints and axial spine further evaluation with rheumatologic panel recommended and the patient is agreeable.  Will call with any abnormal labs to discuss this

## 2017-05-26 NOTE — Assessment & Plan Note (Signed)
Quite complicated case given the percutaneous treatment.  Ultimately I believe the majority of her back pain is coming from core instability and will have her begin working on therapeutic exercises as reviewed below.   I suspect significant hip flexor and anterior chain dominance that is contributing to this.  We will have her begin performing home therapeutic exercises and will plan to follow-up with her in 6-8 weeks to ensure clinical improvement.  If any lack of improvement can consider referral for radiofrequency ablation at that time  ++++++++++++++++++++++++++++++++++++++++++++ PROCEDURE NOTE: THERAPEUTIC EXERCISES (97110) 15 minutes spent for Therapeutic exercises as below and as referenced in the AVS. This included exercises focusing on stretching, strengthening, with significant focus on eccentric aspects.  Proper technique shown and discussed handout in great detail with ATC. All questions were discussed and answered.   Long term goals include an improvement in range of motion, strength, endurance as well as avoiding reinjury. Frequency of visits is one time as determined during today's  office visit. Frequency of exercises to be performed is as per handout.  EXERCISES REVIEWED:  Core stabilization, hip flexor stretching and strengthening and Goodman exercises

## 2017-06-16 ENCOUNTER — Encounter (INDEPENDENT_AMBULATORY_CARE_PROVIDER_SITE_OTHER): Payer: Self-pay | Admitting: Physical Medicine and Rehabilitation

## 2017-06-16 ENCOUNTER — Ambulatory Visit (INDEPENDENT_AMBULATORY_CARE_PROVIDER_SITE_OTHER): Payer: PPO | Admitting: Physical Medicine and Rehabilitation

## 2017-06-16 VITALS — BP 122/82 | HR 51 | Temp 97.9°F

## 2017-06-16 DIAGNOSIS — G8929 Other chronic pain: Secondary | ICD-10-CM

## 2017-06-16 DIAGNOSIS — M47816 Spondylosis without myelopathy or radiculopathy, lumbar region: Secondary | ICD-10-CM

## 2017-06-16 DIAGNOSIS — M545 Low back pain: Secondary | ICD-10-CM | POA: Diagnosis not present

## 2017-06-16 NOTE — Progress Notes (Signed)
Nicole Benson - 67 y.o. female MRN [id number]  Date of birth: 11/13/1950  Office Visit Note: Visit Date: 06/16/2017 PCP: Midge Minium, MD Referred by: Midge Minium, MD  Subjective: Chief Complaint  Patient presents with  . Lower Back - Pain   HPI: Nicole Benson is a 67 year old female who comes in today at the request of Dr. Teresa Coombs for evaluation management of her axial low back pain that is been ongoing for several years but worsening over the last year.  She has been followed for her back pain by Dr. Paulla Fore as well as a pain management physician at [facility], Dr. Wess Botts.  She has had lumbar spine x-rays as well as MRI which is reviewed below and reviewed with the patient.  She reports increasing low back pain with normal daily activities and it increases and gets worse at night.  She does not have specific night pain.  She has had no focal weakness or bowel or bladder changes.  She has had no unintended weight loss.  She said no specific trauma.  She does not have any real radicular symptoms down the legs although she gets some referral to the lateral hips.  Pain is worse going from sit to stand her flexion to extension.  She has had epidural injection in the past as well as physical therapy and home exercises.  She continues to take meloxicam at this point for pain relief.  She does asked today about continuing to take the anti-inflammatory.    Review of Systems  Constitutional: Negative for chills, fever, malaise/fatigue and weight loss.  HENT: Negative for hearing loss and sinus pain.   Eyes: Negative for blurred vision, double vision and photophobia.  Respiratory: Negative for cough and shortness of breath.   Cardiovascular: Negative for chest pain, palpitations and leg swelling.  Gastrointestinal: Negative for abdominal pain, nausea and vomiting.  Genitourinary: Negative for flank pain.  Musculoskeletal: Positive for back pain. Negative for myalgias.  Skin: Negative for  itching and rash.  Neurological: Negative for tremors, focal weakness and weakness.  Endo/Heme/Allergies: Negative.   Psychiatric/Behavioral: Negative for depression.  All other systems reviewed and are negative.  Otherwise per HPI.  Assessment & Plan: Visit Diagnoses:  1. Spondylosis without myelopathy or radiculopathy, lumbar region   2. Chronic bilateral low back pain without sciatica     Plan: Findings:  Chronic worsening axial low back pain without much in the way of referral pattern with MRI findings of mild to moderate bilateral facet arthropathy but also Modic type II endplate changes at O2-H4 with a left paracentral disc extrusion.  She does not really report any left-sided buttock or hamstring pain to speak of.  Her pain seems to be related mainly to the facet joints.  I think the best approach given the fact that she is failed conservative care is to look at diagnostic medial branch blocks of the lower facet joints.  This would be completed with a pain diary and if she does extremely well getting more than 50% pain relief we would complete his second diagnostic medial branch block and a double block paradigm.  If she is successful in that we could look at radiofrequency ablation.  If she does not get much relief with the medial branch block at least one time I would either look at bilateral S1 transforaminal epidural steroid injection or possibly consider intradiscal steroid injection.  Alternatively she is from an interventional standpoint could look at a gray rami communicans block  and ultimately ablation.  She will continue on with her current anti-inflammatories I think that is safe to take at this point has been followed by her primary care physician.  I did express to her that she really is doing everything right in terms of her back and this point I think interventional procedure is recommended.    Meds & Orders: No orders of the defined types were placed in this encounter.  No  orders of the defined types were placed in this encounter.   Follow-up: Return for Bilateral medial branch blocks.   Procedures: No procedures performed  No notes on file   Clinical History: Lumbar spine MRI dated 10/28/2016 IMPRESSION: Degenerative changes of the lumbar spine. Most notably at L5/S1 there is a left paracentral disc extrusion which appears to abut the traversing left-sided nerve roots which are displaced. Mild central canal stenosis and moderate bilateral foraminal  stenosis.  Facet degenerative changes lower lumbar spine.  FINDINGS:  Bones:Vertebral body heights are maintained. No acute fracture. No focal lesions. Spinal cord/conus: No abnormal signal or mass.Conus terminates at normal level. Alignment: No significant subluxation.  Degenerative changes: Disc desiccation signal L2/L3-L5/S1. T12-L1: No significant foraminal or central canal stenosis.No disc herniation.  L1-L2: No significant foraminal or central canal stenosis.No disc herniation.   L2-L3: Disc space height loss. Minimal disc bulge. Mild facet degenerative changes. Slight effacement of the anterior aspect of the thecal sac. No significant foraminal stenosis.  L3-L4: Mild facet degenerative changes. No significant foraminal or central canal stenosis.No disc herniation.   L4-L5: Mild to moderate bilateral facet degenerative changes with facet joint effusion. No significant foraminal or central canal stenosis.No disc herniation.  L5-S1: Moderate disc space height loss. Modic type II endplate degenerative changes. Left paracentral disc extrusion. This appears to abut and mildly displace the traversing left S1 nerve roots. Mild central canal stenosis. Moderate bilateral facet  degenerative changes. Moderate bilateral foraminal stenosis.   Contrast: No unexpected contrast enhancement. Specifically no visualized masses or peripheral enhancing focal fluid collections.  Paraspinal soft  tissues: Unremarkable  Incidental:  None.  She reports that  has never smoked. she has never used smokeless tobacco. No results for input(s): HGBA1C, LABURIC in the last 8760 hours.  Objective:  VS:  HT:    WT:   BMI:     BP:122/82  HR:(!) 51bpm  TEMP:97.9 F (36.6 C)( )  RESP:99 % Physical Exam  Constitutional: She is oriented to person, place, and time. She appears well-developed and well-nourished. No distress.  HENT:  Head: Normocephalic and atraumatic.  Nose: Nose normal.  Mouth/Throat: Oropharynx is clear and moist.  Eyes: Conjunctivae are normal. Pupils are equal, round, and reactive to light.  Neck: Normal range of motion. Neck supple.  Cardiovascular: Regular rhythm and intact distal pulses.  Pulmonary/Chest: Effort normal. No respiratory distress.  Abdominal: Soft. She exhibits no distension. There is no guarding.  Musculoskeletal:  Patient ambulates without aid.  She is somewhat slow to rise from a seated position and does have pain with extension at end ranges of rotation of the lumbar spine.  She has no pain with hip rotation.  She has some mild tenderness over the greater trochanters bilaterally.  She has good distal strength without clonus.  She has a negative slump test bilaterally.  She has no focal trigger points in the quadratus lumborum.  Neurological: She is alert and oriented to person, place, and time. She exhibits normal muscle tone. Coordination normal.  Skin: Skin is warm. No  rash noted. No erythema.  Psychiatric: She has a normal mood and affect. Her behavior is normal.  Nursing note and vitals reviewed.   Ortho Exam Imaging: No results found.  Past Medical/Family/Surgical/Social History: Medications & Allergies reviewed per EMR Patient Active Problem List   Diagnosis Date Noted  . Physical exam 04/29/2017  . Polyarthralgia 03/29/2017  . Allergic rhinitis 12/10/2016  . Low back pain 12/10/2016  . HIP PAIN, RIGHT 10/18/2009  . TROCHANTERIC  BURSITIS 10/18/2009  . SUBSCAPULARIS SPRAIN AND STRAIN 02/21/2009  . 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 Persistent disorder of initiating or maintaining sleep 12/23/2006  . MENISCUS INJURY 12/23/2006   Past Medical History:  Diagnosis Date  . Chronic lower back pain   . Heart murmur    Family History  Problem Relation Age of Onset  . Hypertension Mother   . Cancer Father        throat  . Cancer Maternal Grandmother        bone  . Cancer Maternal Grandfather        colon  . Colon cancer Maternal Grandfather        ? early 66's  . Esophageal cancer Neg Hx   . Pancreatic cancer Neg Hx   . Rectal cancer Neg Hx   . Stomach cancer Neg Hx    Past Surgical History:  Procedure Laterality Date  . COLONOSCOPY    . COLONOSCOPY W/ BIOPSIES     hyperplastic polyp  . KNEE SURGERY Right   . POLYPECTOMY     Social History   Occupational History  . Occupation: botanist  Tobacco Use  . Smoking status: Never Smoker  . Smokeless tobacco: Never Used  Substance and Sexual Activity  . Alcohol use: Yes    Alcohol/week: 4.2 oz    Types: 7 Glasses of wine per week    Comment: 4 drinks a week  . Drug use: No  . Sexual activity: Yes    Partners: Male

## 2017-06-16 NOTE — Progress Notes (Deleted)
Pt states lower back pain. Pt states pain increases with normal daily activities, pain also increases and gets worse at night. Pt asks should she continue taking the anti inflammatory?  Dr. Wess Botts [facility].

## 2017-06-28 ENCOUNTER — Encounter (INDEPENDENT_AMBULATORY_CARE_PROVIDER_SITE_OTHER): Payer: Self-pay | Admitting: Physical Medicine and Rehabilitation

## 2017-06-28 ENCOUNTER — Ambulatory Visit (INDEPENDENT_AMBULATORY_CARE_PROVIDER_SITE_OTHER): Payer: PPO

## 2017-06-28 ENCOUNTER — Ambulatory Visit (INDEPENDENT_AMBULATORY_CARE_PROVIDER_SITE_OTHER): Payer: PPO | Admitting: Physical Medicine and Rehabilitation

## 2017-06-28 VITALS — BP 129/77 | HR 50 | Temp 97.7°F

## 2017-06-28 DIAGNOSIS — M47816 Spondylosis without myelopathy or radiculopathy, lumbar region: Secondary | ICD-10-CM

## 2017-06-28 MED ORDER — DEXAMETHASONE SODIUM PHOSPHATE 10 MG/ML IJ SOLN
15.0000 mg | Freq: Once | INTRAMUSCULAR | Status: AC
  Administered 2017-06-28: 15 mg

## 2017-06-28 MED ORDER — BUPIVACAINE HCL 0.5 % IJ SOLN
3.0000 mL | Freq: Once | INTRAMUSCULAR | Status: AC
  Administered 2017-06-28: 3 mL

## 2017-06-28 NOTE — Procedures (Signed)
[name] is a 67 year old lower back pain that has been going on for approximately 2 years.  We recently saw her for evaluation and treatment.  She brings in today a copy of the MRI that was completed at [facility].  Her MRI essentially agrees with the report that we had already seen but I would argue that the facet arthropathy at L4-5 is in fact moderate 3 and L5-S1 is moderate plus.  She has no central canal stenosis.  She has some degenerative changes of the disc.  We are going to complete bilateral medial branch blocks of the L4-5 and L5-S1 facet joints.  She did check with the study that she was involved with for [facility] and they are okay with [place] doing the branch blocks.  Lumbar Diagnostic Facet Joint Nerve Block with Fluoroscopic Guidance   Patient: Nicole Benson      Date of Birth: Feb 06, 1951 MRN: [id number] PCP: Midge Minium, MD      Visit Date: 06/28/2017   Universal Protocol:    Date/Time: 01/14/198:22 AM  Consent Given By: the patient  Position: PRONE  Additional Comments: Vital signs were monitored before and after the procedure. Patient was prepped and draped in the usual sterile fashion. The correct patient, procedure, and site was verified.   Injection Procedure Details:  Procedure Site One Meds Administered:  Meds ordered this encounter  Medications  . bupivacaine (MARCAINE) 0.5 % (with pres) injection 3 mL  . dexamethasone (DECADRON) injection 15 mg     Laterality: Bilateral  Location/Site:  L4-L5 L5-S1  Needle size: 22 ga.  Needle type:spinal  Needle Placement: Oblique pedical  Findings:   -Comments: There was excellent flow of contrast along the articular pillars without intravascular flow.  Procedure Details: The fluoroscope beam is vertically oriented in AP and then obliqued 15 to 20 degrees to the ipsilateral side of the desired nerve to achieve the "Scotty dog" appearance.  The skin over the target area of the junction of  the superior articulating process and the transverse process (sacral ala if blocking the L5 dorsal rami) was locally anesthetized with a 1 ml volume of 1% Lidocaine without Epinephrine.  The spinal needle was inserted and advanced in a trajectory view down to the target.   After contact with periosteum and negative aspirate for blood and CSF, correct placement without intravascular or epidural spread was confirmed by injecting 0.5 ml. of Isovue-250.  A spot radiograph was obtained of this image.    Next, a 0.5 ml. volume of the injectate described above was injected. The needle was then redirected to the other facet joint nerves mentioned above if needed.  Prior to the procedure, the patient was given a Pain Diary which was completed for baseline measurements.  After the procedure, the patient rated their pain every 30 minutes and will continue rating at this frequency for a total of 5 hours.  The patient has been asked to complete the Diary and return to [place] by mail, fax or hand delivered as soon as possible.   Additional Comments:  The patient tolerated the procedure well Dressing: Band-Aid    Post-procedure details: Patient was observed during the procedure. Post-procedure instructions were reviewed.  Patient left the clinic in stable condition.  Pertinent Imaging: Lumbar spine MRI dated 10/28/2016 IMPRESSION: Degenerative changes of the lumbar spine. Most notably at L5/S1 there is a left paracentral disc extrusion which appears to abut the traversing left-sided nerve roots which are displaced. Mild central canal  stenosis and moderate bilateral foraminal  stenosis.  Facet degenerative changes lower lumbar spine.  FINDINGS:  Bones:Vertebral body heights are maintained. No acute fracture. No focal lesions. Spinal cord/conus: No abnormal signal or mass.Conus terminates at normal level. Alignment: No significant subluxation.  Degenerative changes: Disc desiccation signal  L2/L3-L5/S1. T12-L1: No significant foraminal or central canal stenosis.No disc herniation.  L1-L2: No significant foraminal or central canal stenosis.No disc herniation.   L2-L3: Disc space height loss. Minimal disc bulge. Mild facet degenerative changes. Slight effacement of the anterior aspect of the thecal sac. No significant foraminal stenosis.  L3-L4: Mild facet degenerative changes. No significant foraminal or central canal stenosis.No disc herniation.   L4-L5: Mild to moderate bilateral facet degenerative changes with facet joint effusion. No significant foraminal or central canal stenosis.No disc herniation.  L5-S1: Moderate disc space height loss. Modic type II endplate degenerative changes. Left paracentral disc extrusion. This appears to abut and mildly displace the traversing left S1 nerve roots. Mild central canal stenosis. Moderate bilateral facet  degenerative changes. Moderate bilateral foraminal stenosis.   Contrast: No unexpected contrast enhancement. Specifically no visualized masses or peripheral enhancing focal fluid collections.  Paraspinal soft tissues: Unremarkable  Incidental:  None.

## 2017-06-28 NOTE — Patient Instructions (Signed)

## 2017-06-28 NOTE — Progress Notes (Deleted)
Pt states dull constant lower back pain. Pt states pain has been constant for about 2 yrs. Pt states sitting, standing, bending forward, and laying down makes pain worse. Pt states pain medication makes it better. +Driver, -BT, -Dye Allergies.

## 2017-07-01 ENCOUNTER — Encounter (INDEPENDENT_AMBULATORY_CARE_PROVIDER_SITE_OTHER): Payer: Self-pay | Admitting: Physical Medicine and Rehabilitation

## 2017-07-05 ENCOUNTER — Encounter (INDEPENDENT_AMBULATORY_CARE_PROVIDER_SITE_OTHER): Payer: Self-pay | Admitting: Physical Medicine and Rehabilitation

## 2017-07-08 ENCOUNTER — Telehealth: Payer: Self-pay | Admitting: Audiology

## 2017-07-08 NOTE — Telephone Encounter (Signed)
[facility] does not provide hearing aids. If that is primary reason for visit, follow-up with referent for a hearing aid evaluation and audiogram is recommended. Cheyann Blecha L. Heide Spark, Au.D., CCC-A Doctor of Audiology 07/08/2017

## 2017-07-13 ENCOUNTER — Ambulatory Visit (INDEPENDENT_AMBULATORY_CARE_PROVIDER_SITE_OTHER): Payer: PPO

## 2017-07-13 ENCOUNTER — Ambulatory Visit (INDEPENDENT_AMBULATORY_CARE_PROVIDER_SITE_OTHER): Payer: PPO | Admitting: Physical Medicine and Rehabilitation

## 2017-07-13 ENCOUNTER — Encounter (INDEPENDENT_AMBULATORY_CARE_PROVIDER_SITE_OTHER): Payer: Self-pay | Admitting: Physical Medicine and Rehabilitation

## 2017-07-13 VITALS — BP 114/71 | HR 53 | Temp 97.5°F

## 2017-07-13 DIAGNOSIS — M47816 Spondylosis without myelopathy or radiculopathy, lumbar region: Secondary | ICD-10-CM | POA: Diagnosis not present

## 2017-07-13 MED ORDER — BUPIVACAINE HCL 0.5 % IJ SOLN
3.0000 mL | Freq: Once | INTRAMUSCULAR | Status: AC
  Administered 2017-07-13: 3 mL

## 2017-07-13 NOTE — Progress Notes (Deleted)
Pt states pain in lower back. Pt states no changes since last visit on 07/01/17. +Driver, -BT, -Dye Allergies.

## 2017-07-13 NOTE — Patient Instructions (Signed)

## 2017-07-14 NOTE — Procedures (Signed)
Nicole Benson is an active 67 year old female with chronic axial low back pain.  We recently completed diagnostic medial branch facet joint blocks at L4-5 and L5-S1 with almost 100% relief during the anesthetic phase of the injection.  This was documented with pain diary.  This is also documented today by word of mouth.  She has had physical therapy extensively and medication management without relief.  She does have facet arthropathy of the lower spine she has no radicular complaints.  We will go ahead today and repeat the medial branch blocks with thoughts of proceeding further with radiofrequency ablation of the same nerves.  Lumbar Diagnostic Facet Joint Nerve Block with Fluoroscopic Guidance   Patient: Nicole Benson      Date of Birth: 1950-08-13 MRN: [id number] PCP: Midge Minium, MD      Visit Date: 07/13/2017   Universal Protocol:    Date/Time: 01/30/195:30 AM  Consent Given By: the patient  Position: PRONE  Additional Comments: Vital signs were monitored before and after the procedure. Patient was prepped and draped in the usual sterile fashion. The correct patient, procedure, and site was verified.   Injection Procedure Details:  Procedure Site One Meds Administered:  Meds ordered this encounter  Medications  . bupivacaine (MARCAINE) 0.5 % (with pres) injection 3 mL     Laterality: Bilateral  Location/Site: Bilateral L3 and L4 medial branches and L5 dorsal rami. L4-L5 L5-S1  Needle size: 22 ga.  Needle type:spinal  Needle Placement: Oblique pedical  Findings:   -Comments: There was excellent flow of contrast along the articular pillars without intravascular flow.  Procedure Details: The fluoroscope beam is vertically oriented in AP and then obliqued 15 to 20 degrees to the ipsilateral side of the desired nerve to achieve the "Scotty dog" appearance.  The skin over the target area of the junction of the superior articulating process and the transverse process  (sacral ala if blocking the L5 dorsal rami) was locally anesthetized with a 1 ml volume of 1% Lidocaine without Epinephrine.  The spinal needle was inserted and advanced in a trajectory view down to the target.   After contact with periosteum and negative aspirate for blood and CSF, correct placement without intravascular or epidural spread was confirmed by injecting 0.5 ml. of Isovue-250.  A spot radiograph was obtained of this image.    Next, a 0.5 ml. volume of the injectate described above was injected. The needle was then redirected to the other facet joint nerves mentioned above if needed.  Prior to the procedure, the patient was given a Pain Diary which was completed for baseline measurements.  After the procedure, the patient rated their pain every 30 minutes and will continue rating at this frequency for a total of 5 hours.  The patient has been asked to complete the Diary and return to [place] by mail, fax or hand delivered as soon as possible.   Additional Comments:  The patient tolerated the procedure well Dressing: Band-Aid    Post-procedure details: Patient was observed during the procedure. Post-procedure instructions were reviewed.  Patient left the clinic in stable condition.  Pertinent Imaging: Lumbar spine MRI dated 10/28/2016 IMPRESSION: Degenerative changes of the lumbar spine. Most notably at L5/S1 there is a left paracentral disc extrusion which appears to abut the traversing left-sided nerve roots which are displaced. Mild central canal stenosis and moderate bilateral foraminal  stenosis.  Facet degenerative changes lower lumbar spine.  FINDINGS:  Bones:Vertebral body heights are maintained. No acute fracture. No  focal lesions. Spinal cord/conus: No abnormal signal or mass.Conus terminates at normal level. Alignment: No significant subluxation.  Degenerative changes: Disc desiccation signal L2/L3-L5/S1. T12-L1: No significant foraminal or central canal  stenosis.No disc herniation.  L1-L2: No significant foraminal or central canal stenosis.No disc herniation.   L2-L3: Disc space height loss. Minimal disc bulge. Mild facet degenerative changes. Slight effacement of the anterior aspect of the thecal sac. No significant foraminal stenosis.  L3-L4: Mild facet degenerative changes. No significant foraminal or central canal stenosis.No disc herniation.   L4-L5: Mild to moderate bilateral facet degenerative changes with facet joint effusion. No significant foraminal or central canal stenosis.No disc herniation.  L5-S1: Moderate disc space height loss. Modic type II endplate degenerative changes. Left paracentral disc extrusion. This appears to abut and mildly displace the traversing left S1 nerve roots. Mild central canal stenosis. Moderate bilateral facet  degenerative changes. Moderate bilateral foraminal stenosis.   Contrast: No unexpected contrast enhancement. Specifically no visualized masses or peripheral enhancing focal fluid collections.  Paraspinal soft tissues: Unremarkable  Incidental:  None.

## 2017-08-05 ENCOUNTER — Ambulatory Visit: Payer: PPO | Attending: Audiology | Admitting: Audiology

## 2017-08-05 DIAGNOSIS — H9190 Unspecified hearing loss, unspecified ear: Secondary | ICD-10-CM

## 2017-08-05 DIAGNOSIS — H918X9 Other specified hearing loss, unspecified ear: Secondary | ICD-10-CM | POA: Insufficient documentation

## 2017-08-05 DIAGNOSIS — Z01118 Encounter for examination of ears and hearing with other abnormal findings: Secondary | ICD-10-CM | POA: Insufficient documentation

## 2017-08-05 DIAGNOSIS — R292 Abnormal reflex: Secondary | ICD-10-CM | POA: Diagnosis not present

## 2017-08-05 DIAGNOSIS — R94128 Abnormal results of other function studies of ear and other special senses: Secondary | ICD-10-CM | POA: Insufficient documentation

## 2017-08-05 DIAGNOSIS — H906 Mixed conductive and sensorineural hearing loss, bilateral: Secondary | ICD-10-CM | POA: Insufficient documentation

## 2017-08-05 NOTE — Procedures (Signed)
[facility]  [place], [place] [address]  [phone]   Audiological Evaluation  Patient Name: Nicole Benson   Status: Outpatient   DOB: 07-29-1950    Diagnosis: Decreased hearing bilateral MRN: [id number] Date:  08/05/2017     Referent: Midge Minium, MD  History: Chieko Neises was seen for an audiological evaluation.  Primary Concern: Gradual hearing loss, difficulty in noisy settings and difficulty with soft spoken voices. 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    Evaluation: Conventional pure tone audiometry from 250Hz  - 8000Hz  with using insert earphones.  Hearing Thresholds air conduction hearing thresholds are symmetrical ranging from 30-40 dBHL from 250Hz  -3000Hz . 125Hz  thresholds are 45-50dBHL and hearing thresholds from 4000hz  - 6000hz  are 20-30 dBHL dropping to 30-40 dBHL at 8000Hz .  The hearing loss appears predominantly sensorineural with a slight mixed bilaterally. Please note there appears to be a large, unexplained conductive component on the left side at 250Hz  only. Reliability is good Speech reception levels (repeating words near threshold) using recorded spondee word lists:  Right ear: 35 dBHL.  Left ear:  30 dBHL Word recognition (at comfortably loud volumes) using recorded NU-6 word lists, in quiet with contralateral masking.  Right ear: 100% at 70 dBHL.  Left ear:   96% at 65dBHL Word recognition in minimal background noise:  +5 dBHL  Right ear: 76%                              Left ear:  86%  Tympanometry shows shallow tympanic membrane movement  bilaterally(Type As) with ipsilateral acoustic reflexes from 500Hz  - 8000Hz .  Right ear: Ipsilateral acoustic reflexes are 90-95 dB.  Left ear: Ipsilateral acoustic reflexes of 95-100dB.   CONCLUSION:  Dajanae Brophy has a mild to moderate flat hearing loss bilaterally except for a conductive component at 500Hz  on the left side only. This amount of hearing loss would adversely affect speech communication at normal conversational speech levels. Otoscopic showed no tympanic membrane redness, but the lower quadrant of the TM appeared "wrinkled", this may be an optical illusion from clear earwax, but since the tympanic membrane movement is shallow, further evaluation by an ENT is recommended.   Word recognition is excellent in each ear in quiet at loud conversational speech levels bilaterally. In minimal background noise, word recognition drops to fair in the right ear while remaining good in the left ear.Stevie may benefit from amplification; therefore a hearing aid evaluation is recommended.The test results were discussed and Nayra Coury counseled.   RECOMMENDATIONS: 1.   Medical clearance and evaluation by an ENT. 2.   A hearing aid evaluation. Please check with insurance to determine benefit.  3.   Closely monitor hearing to establish hearing stability and rule out a progressive hearing loss. This may be completed in conjunction with the hearing aid fitting or with another audiologist.  4.  Strategies that help improve hearing include: A) Face the speaker directly. Optimal is having the speakers face well - lit.  Unless amplified, being within 3-6 feet of the  speaker will enhance word recognition. B) Avoid having the speaker back-lit as this will minimize the ability to use cues from lip-reading, facial expression and gestures. C)  Word recognition is poorer in background noise. For optimal word recognition, turn off the TV, radio or noisy fan when engaging in conversation. In a restaurant, try to  sit away from noise sources and close to the primary speaker.  D)  Ask for topic clarification from time to time in order to remain in the conversation.  Most people don't mind repeating or clarifying a point when asked.  If needed, explain the difficulty hearing in background noise or hearing loss. 5.   Use hearing protection during noisy activities such as using a weed eater, moving the lawn, shooting, etc.    Musician's plugs, are available from [employer] for music related hearing protection because there is no distortion.  Other hearing protection, such as sponge plugs (available at pharmacies) or earmuffs (available at sporting goods stores or department stores such as Paediatric nurse) are useful for noisy activities and venues.  Deborah L. Heide Spark Au.D., CCC-A Doctor of Audiology  08/05/2017   cc: Midge Minium, MD

## 2017-08-16 ENCOUNTER — Ambulatory Visit (INDEPENDENT_AMBULATORY_CARE_PROVIDER_SITE_OTHER): Payer: Self-pay

## 2017-08-16 ENCOUNTER — Encounter (INDEPENDENT_AMBULATORY_CARE_PROVIDER_SITE_OTHER): Payer: Self-pay | Admitting: Physical Medicine and Rehabilitation

## 2017-08-16 ENCOUNTER — Ambulatory Visit (INDEPENDENT_AMBULATORY_CARE_PROVIDER_SITE_OTHER): Payer: PPO | Admitting: Physical Medicine and Rehabilitation

## 2017-08-16 VITALS — BP 132/73

## 2017-08-16 DIAGNOSIS — M47816 Spondylosis without myelopathy or radiculopathy, lumbar region: Secondary | ICD-10-CM | POA: Diagnosis not present

## 2017-08-16 DIAGNOSIS — M545 Low back pain: Secondary | ICD-10-CM | POA: Diagnosis not present

## 2017-08-16 DIAGNOSIS — G8929 Other chronic pain: Secondary | ICD-10-CM

## 2017-08-16 MED ORDER — METHYLPREDNISOLONE ACETATE 80 MG/ML IJ SUSP
80.0000 mg | Freq: Once | INTRAMUSCULAR | Status: AC
  Administered 2017-08-16: 80 mg

## 2017-08-16 NOTE — Patient Instructions (Signed)

## 2017-08-16 NOTE — Progress Notes (Signed)
.  Numeric Pain Rating Scale and Functional Assessment Average Pain 6   In the last MONTH (on 0-10 scale) has pain interfered with the following?  1. General activity like being  able to carry out your everyday physical activities such as walking, climbing stairs, carrying groceries, or moving a chair?  Rating(7)   +Driver

## 2017-08-19 NOTE — Procedures (Signed)
Lumbar Facet Joint Nerve Denervation  Patient: Nicole Benson      Date of Birth: 10/20/1950 MRN: [id number] PCP: Midge Minium, MD      Visit Date: 08/16/2017   Universal Protocol:    Date/Time: 03/07/191:25 PM  Consent Given By: the patient  Position: PRONE  Additional Comments: Vital signs were monitored before and after the procedure. Patient was prepped and draped in the usual sterile fashion. The correct patient, procedure, and site was verified.   Injection Procedure Details:  Procedure Site One Meds Administered:  Meds ordered this encounter  Medications  . methylPREDNISolone acetate (DEPO-MEDROL) injection 80 mg     Laterality: Right  Location/Site: Right L3 and L4 medial branches and L5 dorsal rami L4-L5 L5-S1  Needle size: 18 G  Needle type: Radiofrequency cannula  Needle Placement: Along juncture of superior articular process and transverse pocess  Findings:  -Comments:  Procedure Details: For each desired target nerve, the corresponding transverse process (sacral ala for the L5 dorsal rami) was identified and the fluoroscope was positioned to square off the endplates of the corresponding vertebral body to achieve a true AP midline view.  The beam was then obliqued 15 to 20 degrees and caudally tilted 15 to 20 degrees to line up a trajectory along the target nerves. The skin over the target of the junction of superior articulating process and transverse process (sacral ala for the L5 dorsal rami) was infiltrated with 86ml of 1% Lidocaine without Epinephrine.  The 18 gauge 6mm active tip outer cannula was advanced in trajectory view to the target.  This procedure was repeated for each target nerve.  Then, for all levels, the outer cannula placement was fine-tuned and the position was then confirmed with bi-planar imaging.    Test stimulation was done both at sensory and motor levels to ensure there was no radicular stimulation. The target tissues were  then infiltrated with 1 ml of 1% Lidocaine without Epinephrine. Subsequently, a percutaneous neurotomy was carried out for 60 seconds at 80 degrees Celsius. The procedure was repeated with the cannula rotated 90 degrees, for duration of 60 seconds, one additional time at each level for a total of two lesions per level.  After the completion of the two lesions, 1 ml of injectate was delivered. It was then repeated for each facet joint nerve mentioned above. Appropriate radiographs were obtained to verify the probe placement during the neurotomy.   Additional Comments:  The patient tolerated the procedure well No complications occurred Dressing: Band-Aid    Post-procedure details: Patient was observed during the procedure. Post-procedure instructions were reviewed.  Patient left the clinic in stable condition.

## 2017-08-19 NOTE — Progress Notes (Signed)
Nicole Benson - 67 y.o. female MRN [id number]  Date of birth: [birth year]  Office Visit Note: Visit Date: 08/16/2017 PCP: Midge Minium, MD Referred by: Midge Minium, MD  Subjective: Chief Complaint  Patient presents with  . Lower Back - Pain  . Right Hip - Pain  . Left Hip - Pain   HPI: [name] is a very pleasant 67 year old female that I been seeing over the last several months with chronic worsening axial low back pain which is bilateral.  It is worse with standing and ambulating and extension.  MRI findings and x-ray findings show multilevel facet arthropathy.  She has had physical therapy as well as other conservative care without relief.  We have completed double diagnostic medial branch blocks of the L4-5 and L5-S1 facet joints with almost 100% relief during the anesthetic phase of the injection.  She comes in today for right-sided ablation of the L4-5 and L5-S1 facet joints.    ROS Otherwise per HPI.  Assessment & Plan: Visit Diagnoses:  1. Spondylosis without myelopathy or radiculopathy, lumbar region   2. Chronic bilateral low back pain without sciatica     Plan: Findings:  Right L4-5 and L5-S1 facet joint ablation.  This is an ablation procedure for the right L3 and L4 medial branches and right L5 dorsal rami.    Meds & Orders:  Meds ordered this encounter  Medications  . methylPREDNISolone acetate (DEPO-MEDROL) injection 80 mg    Orders Placed This Encounter  Procedures  . Radiofrequency,Lumbar  . XR C-ARM NO REPORT    Follow-up: Return if symptoms worsen or fail to improve.   Procedures: No procedures performed  Lumbar Facet Joint Nerve Denervation  Patient: Nicole Benson      Date of Birth: Jul 07, 1950 MRN: [id number] PCP: Midge Minium, MD      Visit Date: 08/16/2017   Universal Protocol:    Date/Time: 03/07/191:25 PM  Consent Given By: the patient  Position: PRONE  Additional Comments: Vital signs were monitored before and  after the procedure. Patient was prepped and draped in the usual sterile fashion. The correct patient, procedure, and site was verified.   Injection Procedure Details:  Procedure Site One Meds Administered:  Meds ordered this encounter  Medications  . methylPREDNISolone acetate (DEPO-MEDROL) injection 80 mg     Laterality: Right  Location/Site: Right L3 and L4 medial branches and L5 dorsal rami L4-L5 L5-S1  Needle size: 18 G  Needle type: Radiofrequency cannula  Needle Placement: Along juncture of superior articular process and transverse pocess  Findings:  -Comments:  Procedure Details: For each desired target nerve, the corresponding transverse process (sacral ala for the L5 dorsal rami) was identified and the fluoroscope was positioned to square off the endplates of the corresponding vertebral body to achieve a true AP midline view.  The beam was then obliqued 15 to 20 degrees and caudally tilted 15 to 20 degrees to line up a trajectory along the target nerves. The skin over the target of the junction of superior articulating process and transverse process (sacral ala for the L5 dorsal rami) was infiltrated with 81ml of 1% Lidocaine without Epinephrine.  The 18 gauge 69mm active tip outer cannula was advanced in trajectory view to the target.  This procedure was repeated for each target nerve.  Then, for all levels, the outer cannula placement was fine-tuned and the position was then confirmed with bi-planar imaging.    Test stimulation was done both at sensory and motor  levels to ensure there was no radicular stimulation. The target tissues were then infiltrated with 1 ml of 1% Lidocaine without Epinephrine. Subsequently, a percutaneous neurotomy was carried out for 60 seconds at 80 degrees Celsius. The procedure was repeated with the cannula rotated 90 degrees, for duration of 60 seconds, one additional time at each level for a total of two lesions per level.  After the  completion of the two lesions, 1 ml of injectate was delivered. It was then repeated for each facet joint nerve mentioned above. Appropriate radiographs were obtained to verify the probe placement during the neurotomy.   Additional Comments:  The patient tolerated the procedure well No complications occurred Dressing: Band-Aid    Post-procedure details: Patient was observed during the procedure. Post-procedure instructions were reviewed.  Patient left the clinic in stable condition.      Clinical History: Lumbar spine MRI dated 10/28/2016 IMPRESSION: Degenerative changes of the lumbar spine. Most notably at L5/S1 there is a left paracentral disc extrusion which appears to abut the traversing left-sided nerve roots which are displaced. Mild central canal stenosis and moderate bilateral foraminal  stenosis.  Facet degenerative changes lower lumbar spine.  FINDINGS:  Bones:Vertebral body heights are maintained. No acute fracture. No focal lesions. Spinal cord/conus: No abnormal signal or mass.Conus terminates at normal level. Alignment: No significant subluxation.  Degenerative changes: Disc desiccation signal L2/L3-L5/S1. T12-L1: No significant foraminal or central canal stenosis.No disc herniation.  L1-L2: No significant foraminal or central canal stenosis.No disc herniation.   L2-L3: Disc space height loss. Minimal disc bulge. Mild facet degenerative changes. Slight effacement of the anterior aspect of the thecal sac. No significant foraminal stenosis.  L3-L4: Mild facet degenerative changes. No significant foraminal or central canal stenosis.No disc herniation.   L4-L5: Mild to moderate bilateral facet degenerative changes with facet joint effusion. No significant foraminal or central canal stenosis.No disc herniation.  L5-S1: Moderate disc space height loss. Modic type II endplate degenerative changes. Left paracentral disc extrusion. This appears to  abut and mildly displace the traversing left S1 nerve roots. Mild central canal stenosis. Moderate bilateral facet  degenerative changes. Moderate bilateral foraminal stenosis.   Contrast: No unexpected contrast enhancement. Specifically no visualized masses or peripheral enhancing focal fluid collections.  Paraspinal soft tissues: Unremarkable  Incidental:  None.   She reports that  has never smoked. she has never used smokeless tobacco. No results for input(s): HGBA1C, LABURIC in the last 8760 hours.  Objective:  VS:  HT:    WT:   BMI:     BP:132/73  HR: bpm  TEMP: ( )  RESP:  Physical Exam  Musculoskeletal:  Concordant low back pain with extension of the lumbar spine.  Patient has good distal strength.    Ortho Exam Imaging: No results found.  Past Medical/Family/Surgical/Social History: Medications & Allergies reviewed per EMR, new medications updated. Patient Active Problem List   Diagnosis Date Noted  . Physical exam 04/29/2017  . Polyarthralgia 03/29/2017  . Allergic rhinitis 12/10/2016  . Low back pain 12/10/2016  . HIP PAIN, RIGHT 10/18/2009  . TROCHANTERIC BURSITIS 10/18/2009  . SUBSCAPULARIS SPRAIN AND STRAIN 02/21/2009  . 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 Persistent disorder of initiating or maintaining sleep 12/23/2006  . MENISCUS INJURY 12/23/2006   Past Medical History:  Diagnosis Date  . Chronic lower back pain   . Heart murmur    Family History  Problem Relation Age of Onset  . Hypertension Mother   . Cancer Father  throat  . Cancer Maternal Grandmother        bone  . Cancer Maternal Grandfather        colon  . Colon cancer Maternal Grandfather        ? early 63's  . Esophageal cancer Neg Hx   . Pancreatic cancer Neg Hx   . Rectal cancer Neg Hx   . Stomach cancer Neg Hx    Past Surgical History:  Procedure Laterality Date  . COLONOSCOPY    . COLONOSCOPY W/ BIOPSIES     hyperplastic polyp  . KNEE SURGERY Right   . POLYPECTOMY      Social History   Occupational History  . Occupation: botanist  Tobacco Use  . Smoking status: Never Smoker  . Smokeless tobacco: Never Used  Substance and Sexual Activity  . Alcohol use: Yes    Alcohol/week: 4.2 oz    Types: 7 Glasses of wine per week    Comment: 4 drinks a week  . Drug use: No  . Sexual activity: Yes    Partners: Male

## 2017-08-23 ENCOUNTER — Encounter (INDEPENDENT_AMBULATORY_CARE_PROVIDER_SITE_OTHER): Payer: Self-pay | Admitting: Physical Medicine and Rehabilitation

## 2017-08-23 ENCOUNTER — Ambulatory Visit (INDEPENDENT_AMBULATORY_CARE_PROVIDER_SITE_OTHER): Payer: Self-pay

## 2017-08-23 ENCOUNTER — Ambulatory Visit (INDEPENDENT_AMBULATORY_CARE_PROVIDER_SITE_OTHER): Payer: PPO | Admitting: Physical Medicine and Rehabilitation

## 2017-08-23 VITALS — BP 110/73

## 2017-08-23 DIAGNOSIS — M545 Low back pain, unspecified: Secondary | ICD-10-CM

## 2017-08-23 DIAGNOSIS — G8929 Other chronic pain: Secondary | ICD-10-CM | POA: Diagnosis not present

## 2017-08-23 DIAGNOSIS — M47816 Spondylosis without myelopathy or radiculopathy, lumbar region: Secondary | ICD-10-CM

## 2017-08-23 MED ORDER — METHYLPREDNISOLONE ACETATE 80 MG/ML IJ SUSP
80.0000 mg | Freq: Once | INTRAMUSCULAR | Status: AC
  Administered 2017-08-23: 80 mg

## 2017-08-23 NOTE — Progress Notes (Signed)
.  Numeric Pain Rating Scale and Functional Assessment Average Pain 3   In the last MONTH (on 0-10 scale) has pain interfered with the following?  1. General activity like being  able to carry out your everyday physical activities such as walking, climbing stairs, carrying groceries, or moving a chair?  Rating(3)   +Driver, -BT, -Dye Allergies.

## 2017-08-23 NOTE — Patient Instructions (Signed)

## 2017-08-24 ENCOUNTER — Ambulatory Visit (INDEPENDENT_AMBULATORY_CARE_PROVIDER_SITE_OTHER): Payer: Self-pay

## 2017-08-25 NOTE — Progress Notes (Signed)
Nicole Benson - 67 y.o. female MRN [id number]  Date of birth: January 05, 1951  Office Visit Note: Visit Date: 08/23/2017 PCP: Midge Minium, MD Referred by: Midge Minium, MD  Subjective: Chief Complaint  Patient presents with  . Lower Back - Pain   HPI: Nicole Benson is a 67 year old female with chronic worsening axial low back pain that has not responded to time, physical therapy as well as self treatment and core strengthening.  She has had medication management.  She is actually been in a study at [facility] for electrical stimulation of multi-muscles.  Nonetheless she did respond to double diagnostic medial branch blocks.  These were all documented.  We have seen her recently for radiofrequency ablation of the right-sided facet joints.  She comes in today stating that did seem to help and she has had some continued help with that but it is sort of coming and going at this point.  I did suggest to her that it will do that until the nerve completely dies down her scars down.  We are going to complete the left side today.    ROS Otherwise per HPI.  Assessment & Plan: Visit Diagnoses:  1. Spondylosis without myelopathy or radiculopathy, lumbar region   2. Chronic bilateral low back pain without sciatica     Plan: No additional findings.   Meds & Orders:  Meds ordered this encounter  Medications  . methylPREDNISolone acetate (DEPO-MEDROL) injection 80 mg    Orders Placed This Encounter  Procedures  . Radiofrequency,Lumbar  . XR C-ARM NO REPORT    Follow-up: Return in about 4 weeks (around 09/20/2017).   Procedures: No procedures performed  Lumbar Facet Joint Nerve Denervation  Patient: Nicole Benson      Date of Birth: [birth year] MRN: [id number] PCP: Midge Minium, MD      Visit Date: 08/23/2017   Universal Protocol:    Date/Time: 08/25/1909:53 AM  Consent Given By: the patient  Position: PRONE  Additional Comments: Vital signs were monitored before and  after the procedure. Patient was prepped and draped in the usual sterile fashion. The correct patient, procedure, and site was verified.   Injection Procedure Details:  Procedure Site One Meds Administered:  Meds ordered this encounter  Medications  . methylPREDNISolone acetate (DEPO-MEDROL) injection 80 mg     Laterality: Left  Location/Site:  L4-L5 L5-S1  Needle size: 18 G  Needle type: Radiofrequency cannula  Needle Placement: Along juncture of superior articular process and transverse pocess  Findings:  -Comments:  Procedure Details: For each desired target nerve, the corresponding transverse process (sacral ala for the L5 dorsal rami) was identified and the fluoroscope was positioned to square off the endplates of the corresponding vertebral body to achieve a true AP midline view.  The beam was then obliqued 15 to 20 degrees and caudally tilted 15 to 20 degrees to line up a trajectory along the target nerves. The skin over the target of the junction of superior articulating process and transverse process (sacral ala for the L5 dorsal rami) was infiltrated with 61ml of 1% Lidocaine without Epinephrine.  The 18 gauge 31mm active tip outer cannula was advanced in trajectory view to the target.  This procedure was repeated for each target nerve.  Then, for all levels, the outer cannula placement was fine-tuned and the position was then confirmed with bi-planar imaging.    Test stimulation was done both at sensory and motor levels to ensure there was no radicular stimulation. The  target tissues were then infiltrated with 1 ml of 1% Lidocaine without Epinephrine. Subsequently, a percutaneous neurotomy was carried out for 60 seconds at 80 degrees Celsius. The procedure was repeated with the cannula rotated 90 degrees, for duration of 60 seconds, one additional time at each level for a total of two lesions per level.  After the completion of the two lesions, 1 ml of injectate was  delivered. It was then repeated for each facet joint nerve mentioned above. Appropriate radiographs were obtained to verify the probe placement during the neurotomy.   Additional Comments:  The patient tolerated the procedure well Dressing: Band-Aid    Post-procedure details: Patient was observed during the procedure. Post-procedure instructions were reviewed.  Patient left the clinic in stable condition.      Clinical History: Lumbar spine MRI dated 10/28/2016 IMPRESSION: Degenerative changes of the lumbar spine. Most notably at L5/S1 there is a left paracentral disc extrusion which appears to abut the traversing left-sided nerve roots which are displaced. Mild central canal stenosis and moderate bilateral foraminal  stenosis.  Facet degenerative changes lower lumbar spine.  FINDINGS:  Bones:Vertebral body heights are maintained. No acute fracture. No focal lesions. Spinal cord/conus: No abnormal signal or mass.Conus terminates at normal level. Alignment: No significant subluxation.  Degenerative changes: Disc desiccation signal L2/L3-L5/S1. T12-L1: No significant foraminal or central canal stenosis.No disc herniation.  L1-L2: No significant foraminal or central canal stenosis.No disc herniation.   L2-L3: Disc space height loss. Minimal disc bulge. Mild facet degenerative changes. Slight effacement of the anterior aspect of the thecal sac. No significant foraminal stenosis.  L3-L4: Mild facet degenerative changes. No significant foraminal or central canal stenosis.No disc herniation.   L4-L5: Mild to moderate bilateral facet degenerative changes with facet joint effusion. No significant foraminal or central canal stenosis.No disc herniation.  L5-S1: Moderate disc space height loss. Modic type II endplate degenerative changes. Left paracentral disc extrusion. This appears to abut and mildly displace the traversing left S1 nerve roots. Mild central canal  stenosis. Moderate bilateral facet  degenerative changes. Moderate bilateral foraminal stenosis.   Contrast: No unexpected contrast enhancement. Specifically no visualized masses or peripheral enhancing focal fluid collections.  Paraspinal soft tissues: Unremarkable  Incidental:  None.   She reports that  has never smoked. she has never used smokeless tobacco. No results for input(s): HGBA1C, LABURIC in the last 8760 hours.  Objective:  VS:  HT:    WT:   BMI:     BP:110/73  HR: bpm  TEMP: ( )  RESP:  Physical Exam  Musculoskeletal:  Pain going from sit to stand with extension.  Patient does seem to have some trigger points in the paraspinal musculature.    Ortho Exam Imaging: No results found.  Past Medical/Family/Surgical/Social History: Medications & Allergies reviewed per EMR, new medications updated. Patient Active Problem List   Diagnosis Date Noted  . Physical exam 04/29/2017  . Polyarthralgia 03/29/2017  . Allergic rhinitis 12/10/2016  . Low back pain 12/10/2016  . HIP PAIN, RIGHT 10/18/2009  . TROCHANTERIC BURSITIS 10/18/2009  . SUBSCAPULARIS SPRAIN AND STRAIN 02/21/2009  . 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 Persistent disorder of initiating or maintaining sleep 12/23/2006  . MENISCUS INJURY 12/23/2006   Past Medical History:  Diagnosis Date  . Chronic lower back pain   . Heart murmur    Family History  Problem Relation Age of Onset  . Hypertension Mother   . Cancer Father        throat  .  Cancer Maternal Grandmother        bone  . Cancer Maternal Grandfather        colon  . Colon cancer Maternal Grandfather        ? early 102's  . Esophageal cancer Neg Hx   . Pancreatic cancer Neg Hx   . Rectal cancer Neg Hx   . Stomach cancer Neg Hx    Past Surgical History:  Procedure Laterality Date  . COLONOSCOPY    . COLONOSCOPY W/ BIOPSIES     hyperplastic polyp  . KNEE SURGERY Right   . POLYPECTOMY     Social History   Occupational History  .  Occupation: botanist  Tobacco Use  . Smoking status: Never Smoker  . Smokeless tobacco: Never Used  Substance and Sexual Activity  . Alcohol use: Yes    Alcohol/week: 4.2 oz    Types: 7 Glasses of wine per week    Comment: 4 drinks a week  . Drug use: No  . Sexual activity: Yes    Partners: Male

## 2017-08-25 NOTE — Procedures (Signed)
Lumbar Facet Joint Nerve Denervation  Patient: Nicole Benson      Date of Birth: 21-Feb-1951 MRN: [id number] PCP: Midge Minium, MD      Visit Date: 08/23/2017   Universal Protocol:    Date/Time: 08/25/1909:53 AM  Consent Given By: the patient  Position: PRONE  Additional Comments: Vital signs were monitored before and after the procedure. Patient was prepped and draped in the usual sterile fashion. The correct patient, procedure, and site was verified.   Injection Procedure Details:  Procedure Site One Meds Administered:  Meds ordered this encounter  Medications  . methylPREDNISolone acetate (DEPO-MEDROL) injection 80 mg     Laterality: Left  Location/Site:  L4-L5 L5-S1  Needle size: 18 G  Needle type: Radiofrequency cannula  Needle Placement: Along juncture of superior articular process and transverse pocess  Findings:  -Comments:  Procedure Details: For each desired target nerve, the corresponding transverse process (sacral ala for the L5 dorsal rami) was identified and the fluoroscope was positioned to square off the endplates of the corresponding vertebral body to achieve a true AP midline view.  The beam was then obliqued 15 to 20 degrees and caudally tilted 15 to 20 degrees to line up a trajectory along the target nerves. The skin over the target of the junction of superior articulating process and transverse process (sacral ala for the L5 dorsal rami) was infiltrated with 60ml of 1% Lidocaine without Epinephrine.  The 18 gauge 22mm active tip outer cannula was advanced in trajectory view to the target.  This procedure was repeated for each target nerve.  Then, for all levels, the outer cannula placement was fine-tuned and the position was then confirmed with bi-planar imaging.    Test stimulation was done both at sensory and motor levels to ensure there was no radicular stimulation. The target tissues were then infiltrated with 1 ml of 1% Lidocaine without  Epinephrine. Subsequently, a percutaneous neurotomy was carried out for 60 seconds at 80 degrees Celsius. The procedure was repeated with the cannula rotated 90 degrees, for duration of 60 seconds, one additional time at each level for a total of two lesions per level.  After the completion of the two lesions, 1 ml of injectate was delivered. It was then repeated for each facet joint nerve mentioned above. Appropriate radiographs were obtained to verify the probe placement during the neurotomy.   Additional Comments:  The patient tolerated the procedure well Dressing: Band-Aid    Post-procedure details: Patient was observed during the procedure. Post-procedure instructions were reviewed.  Patient left the clinic in stable condition.

## 2017-11-26 DIAGNOSIS — H5340 Unspecified visual field defects: Secondary | ICD-10-CM | POA: Diagnosis not present

## 2017-11-26 DIAGNOSIS — H40012 Open angle with borderline findings, low risk, left eye: Secondary | ICD-10-CM | POA: Diagnosis not present

## 2017-11-26 DIAGNOSIS — H401220 Low-tension glaucoma, left eye, stage unspecified: Secondary | ICD-10-CM | POA: Diagnosis not present

## 2017-11-26 DIAGNOSIS — H3589 Other specified retinal disorders: Secondary | ICD-10-CM | POA: Diagnosis not present

## 2017-12-23 ENCOUNTER — Ambulatory Visit (INDEPENDENT_AMBULATORY_CARE_PROVIDER_SITE_OTHER): Payer: PPO | Admitting: Family Medicine

## 2017-12-23 ENCOUNTER — Other Ambulatory Visit: Payer: Self-pay

## 2017-12-23 ENCOUNTER — Encounter: Payer: Self-pay | Admitting: Family Medicine

## 2017-12-23 VITALS — BP 100/68 | HR 57 | Temp 98.1°F | Resp 15 | Ht 64.0 in | Wt 127.4 lb

## 2017-12-23 DIAGNOSIS — G8929 Other chronic pain: Secondary | ICD-10-CM

## 2017-12-23 DIAGNOSIS — R6889 Other general symptoms and signs: Secondary | ICD-10-CM

## 2017-12-23 DIAGNOSIS — J309 Allergic rhinitis, unspecified: Secondary | ICD-10-CM | POA: Diagnosis not present

## 2017-12-23 DIAGNOSIS — M545 Low back pain, unspecified: Secondary | ICD-10-CM

## 2017-12-23 MED ORDER — FLUTICASONE PROPIONATE 50 MCG/ACT NA SUSP: 2.0000 | Freq: Every day | NASAL | 6 refills | Status: DC

## 2017-12-23 NOTE — Patient Instructions (Signed)
Follow up as needed or as scheduled RESTART the Flonase- 2 sprays each nostril daily TAKE the Levocetirizine (Xyzal) daily to decrease congestion Drink plenty of fluids Your joint pains after activity are normal After repetitive motion, ICE and take an OTC anti-inflammatory as needed (ibuprofen, Aleve, etc) Please have someone come check the black areas growing on your ceiling Call with any questions or concerns Have a great summer!!

## 2017-12-23 NOTE — Progress Notes (Signed)
   Subjective:    Patient ID: Nicole Benson, female    DOB: Aug 12, 1950, 67 y.o.   MRN: [id number]  HPI LBP- pt has seen Dr Paulla Fore and Dr Ernestina Patches.  Pt had radiofrequency ablation w/ good relief.  Pt reports pain in hands w/ weeding, pain in lower legs w/ walking on gravel road- 'every time I turn around there's something hurting'.  Pt reports she was on Turmeric previously w/o relief  'Throat congestion'- pt feels the need to constantly clear her throat.  She is drinking water regularly.  Pt has tried OTC Xyzal, Zyrtec, Flonase, Guaifenesin, Sudafed.  Pt initially felt Flonase was effective but then felt it wasn't helping so she stopped medication.  Denies nasal congestion.  Denies acid reflux.  Pt reports sxs are constant.  Pt also reports eye 'mucous'.   Review of Systems For ROS see HPI     Objective:   Physical Exam  Constitutional: She appears well-developed and well-nourished. No distress.  HENT:  Head: Normocephalic and atraumatic.  Right Ear: Tympanic membrane normal.  Left Ear: Tympanic membrane normal.  Nose: Mucosal edema and rhinorrhea present. Right sinus exhibits no maxillary sinus tenderness and no frontal sinus tenderness. Left sinus exhibits no maxillary sinus tenderness and no frontal sinus tenderness.  Mouth/Throat: Mucous membranes are normal. Posterior oropharyngeal erythema (w/ PND) present.  Eyes: Pupils are equal, round, and reactive to light. Conjunctivae and EOM are normal.  Neck: Normal range of motion. Neck supple.  Cardiovascular: Normal rate, regular rhythm and normal heart sounds.  Pulmonary/Chest: Effort normal and breath sounds normal. No respiratory distress. She has no wheezes. She has no rales.  Lymphadenopathy:    She has no cervical adenopathy.  Vitals reviewed.         Assessment & Plan:  'throat congestion'- ongoing issue.  Pt w/ copious untreated PND.  Encouraged her to resume allergy medication.  If no improvement after 2 weeks, will start acid  reducer.  If still no improvement, will refer to ENT.  Pt expressed understanding and is in agreement w/ plan.

## 2017-12-23 NOTE — Assessment & Plan Note (Signed)
Deteriorated.  Pt w/ swollen nasal turbinates and copious PND.  Encouraged her to restart Flonase and Xyzal daily.  Pt expressed understanding and is in agreement w/ plan.

## 2017-12-23 NOTE — Assessment & Plan Note (Signed)
Ongoing issue.  Improved since radiofrequency ablation. Will have pain w/ repetitive motion, and have pain in other joints depending on activities.  Advised her that this was normal and not cause for additional workup.  Discussed anti-inflammatories, turmeric, fish oil, Vit D for pain relief.  Also discussed ice, yoga, stretching, etc.  Will follow.

## 2017-12-31 DIAGNOSIS — Z01419 Encounter for gynecological examination (general) (routine) without abnormal findings: Secondary | ICD-10-CM | POA: Diagnosis not present

## 2017-12-31 DIAGNOSIS — Z124 Encounter for screening for malignant neoplasm of cervix: Secondary | ICD-10-CM | POA: Diagnosis not present

## 2017-12-31 LAB — HM PAP SMEAR

## 2018-01-03 ENCOUNTER — Encounter: Payer: Self-pay | Admitting: General Practice

## 2018-01-18 ENCOUNTER — Ambulatory Visit (INDEPENDENT_AMBULATORY_CARE_PROVIDER_SITE_OTHER): Payer: PPO | Admitting: Physical Medicine and Rehabilitation

## 2018-03-07 ENCOUNTER — Other Ambulatory Visit: Payer: Self-pay | Admitting: Family Medicine

## 2018-03-07 DIAGNOSIS — Z1231 Encounter for screening mammogram for malignant neoplasm of breast: Secondary | ICD-10-CM

## 2018-04-07 ENCOUNTER — Ambulatory Visit
Admission: RE | Admit: 2018-04-07 | Discharge: 2018-04-07 | Disposition: A | Discharge status: Home / Self Care | Payer: PPO | Source: Ambulatory Visit | Attending: Family Medicine | Admitting: Family Medicine

## 2018-04-07 DIAGNOSIS — Z1231 Encounter for screening mammogram for malignant neoplasm of breast: Secondary | ICD-10-CM

## 2018-05-19 ENCOUNTER — Encounter (INDEPENDENT_AMBULATORY_CARE_PROVIDER_SITE_OTHER): Payer: Self-pay | Admitting: Physical Medicine and Rehabilitation

## 2018-05-19 ENCOUNTER — Ambulatory Visit (INDEPENDENT_AMBULATORY_CARE_PROVIDER_SITE_OTHER): Payer: PPO | Admitting: Physical Medicine and Rehabilitation

## 2018-05-19 VITALS — BP 134/84 | HR 50 | Ht 64.5 in | Wt 127.0 lb

## 2018-05-19 DIAGNOSIS — M7918 Myalgia, other site: Secondary | ICD-10-CM

## 2018-05-19 DIAGNOSIS — G8929 Other chronic pain: Secondary | ICD-10-CM | POA: Diagnosis not present

## 2018-05-19 DIAGNOSIS — M545 Low back pain: Secondary | ICD-10-CM | POA: Diagnosis not present

## 2018-05-19 DIAGNOSIS — M47816 Spondylosis without myelopathy or radiculopathy, lumbar region: Secondary | ICD-10-CM | POA: Diagnosis not present

## 2018-05-19 NOTE — Progress Notes (Signed)
Pt is here for a ROV and states pain in lower back. Pt states RFA from 08/23/17 did not have a significant change with pain. Pt states pain started to get worse a month ago. Pt states standing and sitting for a long period of time makes pain worse, heating pad and stretching helps with pain.   .Numeric Pain Rating Scale and Functional Assessment Average Pain 5 Pain Right Now 0 My pain is constant, dull and aching Pain is worse with: sitting and standing Pain improves with: heat/ice   In the last MONTH (on 0-10 scale) has pain interfered with the following?  1. General activity like being  able to carry out your everyday physical activities such as walking, climbing stairs, carrying groceries, or moving a chair?  Rating(4)  2. Relation with others like being able to carry out your usual social activities and roles such as  activities at home, at work and in your community. Rating(4)  3. Enjoyment of life such that you have  been bothered by emotional problems such as feeling anxious, depressed or irritable?  Rating(1)

## 2018-05-26 DIAGNOSIS — H40013 Open angle with borderline findings, low risk, bilateral: Secondary | ICD-10-CM | POA: Diagnosis not present

## 2018-05-26 DIAGNOSIS — H40052 Ocular hypertension, left eye: Secondary | ICD-10-CM | POA: Diagnosis not present

## 2018-05-27 ENCOUNTER — Ambulatory Visit: Payer: PPO | Attending: Physical Medicine and Rehabilitation | Admitting: Physical Therapy

## 2018-05-27 ENCOUNTER — Encounter: Payer: Self-pay | Admitting: Physical Therapy

## 2018-05-27 DIAGNOSIS — M545 Low back pain: Secondary | ICD-10-CM | POA: Diagnosis not present

## 2018-05-27 DIAGNOSIS — M6281 Muscle weakness (generalized): Secondary | ICD-10-CM | POA: Insufficient documentation

## 2018-05-27 DIAGNOSIS — G8929 Other chronic pain: Secondary | ICD-10-CM | POA: Diagnosis not present

## 2018-05-27 NOTE — Therapy (Signed)
[facility] [address], [place], [place], [address] Phone: [phone]   Fax:  [phone]  Physical Therapy Evaluation  Patient Details  Name: Nicole Benson MRN: [id number] Date of Birth: [birth year] Referring Provider (PT): Magnus Sinning MD   Encounter Date: 05/27/2018  PT End of Session - 05/27/18 1201    Visit Number  1    Number of Visits  13    Date for PT Re-Evaluation  07/28/18    PT Start Time  0925    PT Stop Time  1015    PT Time Calculation (min)  50 min    Activity Tolerance  Patient tolerated treatment well    Behavior During Therapy  [facility] for tasks assessed/performed       Past Medical History:  Diagnosis Date  . Chronic lower back pain   . Heart murmur     Past Surgical History:  Procedure Laterality Date  . ABLATION     Radio frequency lower back  . COLONOSCOPY    . COLONOSCOPY W/ BIOPSIES     hyperplastic polyp  . KNEE SURGERY Right   . POLYPECTOMY      There were no vitals filed for this visit.   Subjective Assessment - 05/27/18 0922    Subjective  Pt reporting low back pain for about 4 years. Pt reporting ablation about 1 year ago which helped some, but the pain has returned. Pt reporting that she taking over the counter meds and 15mg  mobic along with tylenol for consesctively two weeks and her back is feeling better than it has in years.     How long can you sit comfortably?  unable to sit though a movie    How long can you stand comfortably?  45 minutes    How long can you walk comfortably?  unlimited on level surfaces, diffictuly on unlevel surfaces    Currently in Pain?  No/denies   pain can vary to 8/10        [facility] PT Assessment - 05/27/18 0001      Assessment   Medical Diagnosis  low back pain    Referring Provider (PT)  Magnus Sinning MD    Onset Date/Surgical Date  --   several years ago   Hand Dominance  Right    Next MD Visit  follow up after therapy     Prior Therapy  yes, for low back 3 times in the last 4 years      Precautions   Precautions  None      Restrictions   Weight Bearing Restrictions  No      Balance Screen   Has the patient fallen in the past 6 months  No      [place] residence    Living Arrangements  Spouse/significant other    Home Access  Level entry    Home Layout  Two level;Bed/bath upstairs    Alternate Level Stairs-Number of Steps  15    Alternate Level Stairs-Rails  Can reach both    [place]  None      Prior Function   Level of [place]  Retired    Leisure  mountain bike, read, listen to music and go to concerts, spend time with family      Cognition   Overall Cognitive Status  Within Functional Limits for tasks assessed      Observation/Other Assessments  Focus on Therapeutic Outcomes (FOTO)   39% limitation      Posture/Postural Control   Posture Comments  L LE appears to be longer than R. Pt with elevated L iliac crest compared to R.       ROM / Strength   AROM / PROM / Strength  AROM;Strength      AROM   Overall AROM Comments  bilateral hip and knees WFL    AROM Assessment Site  --    Right/Left Hip  --      Strength   Overall Strength Comments  R LE grossly 5/5    Strength Assessment Site  Hip    Right/Left Hip  Left    Left Hip Flexion  4/5    Left Hip Extension  4/5    Left Hip ABduction  4/5    Left Hip ADduction  5/5      Palpation   Palpation comment  tenderness noted in bilateral lumbar paraspinals,  R QL and bilateral IT bands,       Special Tests    Special Tests  Sacrolliac Tests    Sacroiliac Tests   Sacral Compression      Sacral Compression   Findings  Negative      Ambulation/Gait   Gait Comments  Pt amb with normal gait pattern                Objective measurements completed on examination: See above findings.      [place] [facility] PT Treatment/Exercise - 05/27/18 0001       Exercises   Exercises  --   PPT, SKTC, Bridges, IT band stretching     Manual Therapy   Manual Therapy  Muscle Energy Technique    Muscle Energy Technique  for pelvis alignment,(L hip flexion, R hip extension)  pt reporting less pain with hip abduction after technique was completed             PT Education - 05/27/18 1200    Education Details  PT POC and  basic lumbar anatomy, HEP    Person(s) Educated  Patient    Methods  Explanation;Handout;Verbal cues;Demonstration    Comprehension  Verbalized understanding;Returned demonstration          PT Long Term Goals - 05/27/18 1203      PT LONG TERM GOAL #1   Title  Pt will be independent in her HEP and progression.     Time  6    Period  Weeks    Status  New    Target Date  07/08/18      PT LONG TERM GOAL #2   Title  Pt will be able to tolerate sitting for a full movie without having to stand due to pain.     Baseline  pt unable to sit an entire movie.     Time  6    Period  Weeks    Status  New    Target Date  07/08/18      PT LONG TERM GOAL #3   Title  Pt will improve her L hip abduction to 5/5 in order to improve functional mobility.     Time  6    Period  Weeks    Status  New    Target Date  07/08/18      PT LONG TERM GOAL #4   Title  Pt will be tolerate riding her mountain bike for greater than 20 minutes on uneven surfaces with  pain </= 2/10.     Time  6    Period  Weeks    Status  New    Target Date  07/08/18             Plan - 05/27/18 1218    Clinical Impression Statement  Pt presenting to therapy today for evaluation of her chronic LBP with no radiation reported. Pt reporting this pain has been going on for about 4 years. Pt with dx of spondylosis. Pt with mild weakness in her L hips. Pt with L LE leg length increase with R drop in ASIS alignment. Pt with palpable tightness in lumbar paraspinals, QL and IT bands. Pt is an avid bike rider both road and mountain and has been having pain with  uneven surfaces with bumps and jaring. Skilled PT needed to progress pt toward her PLOF and progress towrad goals set.      History and Personal Factors relevant to plan of care:  facet ablation, spondylosis    Clinical Presentation  Stable    Clinical Decision Making  Low    Rehab Potential  Excellent    PT Frequency  2x / week    PT Duration  6 weeks    PT Treatment/Interventions  Dry needling;Moist Heat;Ultrasound;Balance training;Therapeutic exercise;Therapeutic activities;Functional mobility training;Patient/family education;Manual techniques;Passive range of motion;Taping    PT Next Visit Plan  continue to assess pt's pelvic alignment, continue with muscle energy techniques, lumbar stretching, core strengthening, DN discussion    PT Home Exercise Plan  SKTC, PPT, bridges with PPT    Consulted and Agree with Plan of Care  Patient       Patient will benefit from skilled therapeutic intervention in order to improve the following deficits and impairments:  Pain, Decreased strength, Decreased activity tolerance, Postural dysfunction, Decreased mobility  Visit Diagnosis: Chronic bilateral low back pain without sciatica  Muscle weakness (generalized)     Problem List Patient Active Problem List   Diagnosis Date Noted  . Physical exam 04/29/2017  . Polyarthralgia 03/29/2017  . Allergic rhinitis 12/10/2016  . Low back pain 12/10/2016  . HIP PAIN, RIGHT 10/18/2009  . TROCHANTERIC BURSITIS 10/18/2009  . SUBSCAPULARIS SPRAIN AND STRAIN 02/21/2009  . 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 Persistent disorder of initiating or maintaining sleep 12/23/2006  . MENISCUS INJURY 12/23/2006    Oretha Caprice, PT 05/27/2018, 12:27 PM  [facility] [address] [place], [place], [address] Phone: [phone]   Fax:  [phone]  Name: Nicole Benson MRN: [id number] Date of Birth: 1950/10/25

## 2018-05-30 ENCOUNTER — Ambulatory Visit: Payer: PPO

## 2018-05-30 DIAGNOSIS — G8929 Other chronic pain: Secondary | ICD-10-CM

## 2018-05-30 DIAGNOSIS — M545 Low back pain: Principal | ICD-10-CM

## 2018-05-30 DIAGNOSIS — M6281 Muscle weakness (generalized): Secondary | ICD-10-CM

## 2018-05-30 NOTE — Patient Instructions (Signed)
Access Code: [email or web address]  URL: [email or web address]  Date: 05/30/2018  Prepared by: Sigurd Sos   Exercises Supine March with Posterior Pelvic Tilt - 10 reps - 2 sets - 1x daily                            - 7x weekly Supine Transversus Abdominis Bracing with Double Leg Fallout - 10 reps - 2 sets - 1x daily - 7x weekly Clamshell - 10 reps - 3 sets - 1x daily - 7x weekly Supine Pelvic Tilt with Straight Leg Raise - 10 reps - 2 sets - 1x daily - 7x weekly Seated Hamstring Stretch - 3 reps - 20 hold - 2x daily - 7x weekly Seated Piriformis Stretch with Trunk Bend - 3 reps - 20 hold - 2x daily - 7x weekly

## 2018-05-30 NOTE — Therapy (Signed)
[facility] [address], [place], [place], [address] Phone: [phone]   Fax:  [phone]  Physical Therapy Treatment  Patient Details  Name: Nicole Benson MRN: [id number] Date of Birth: 05/14/1951 Referring Provider (PT): Magnus Sinning MD   Encounter Date: 05/30/2018  PT End of Session - 05/30/18 1703    Visit Number  2    Date for PT Re-Evaluation  07/28/18    PT Start Time  7591    PT Stop Time  1657    PT Time Calculation (min)  36 min    Activity Tolerance  Patient tolerated treatment well    Behavior During Therapy  [facility] for tasks assessed/performed       Past Medical History:  Diagnosis Date  . Chronic lower back pain   . Heart murmur     Past Surgical History:  Procedure Laterality Date  . ABLATION     Radio frequency lower back  . COLONOSCOPY    . COLONOSCOPY W/ BIOPSIES     hyperplastic polyp  . KNEE SURGERY Right   . POLYPECTOMY      There were no vitals filed for this visit.  Subjective Assessment - 05/30/18 1625    Subjective  The bridges make me hurt    Currently in Pain?  Yes    Pain Score  2     Pain Location  Back    Pain Orientation  Right;Lower    Pain Descriptors / Indicators  Aching;Sore    Pain Type  Chronic pain    Pain Onset  More than a month ago    Pain Frequency  Constant    Aggravating Factors   sitting or standing too long    Pain Relieving Factors  keeping moving                       [facility] Adult PT Treatment/Exercise - 05/30/18 0001      Exercises   Exercises  Knee/Hip;Lumbar      Lumbar Exercises: Stretches   Active Hamstring Stretch  3 reps;Left;Right    Piriformis Stretch  3 reps;20 seconds      Lumbar Exercises: Supine   Pelvic Tilt  10 reps;10 seconds    Other Supine Lumbar Exercises  pelvic tilt with leg motions: abduction, marching and leg extensions      Knee/Hip Exercises: Sidelying   Clams  abdominal bracing 2x10              PT Education - 05/30/18 1649    Education Details  Access Code: [email or web address]    Person(s) Educated  Patient    Methods  Explanation;Demonstration;Handout    Comprehension  Verbalized understanding;Returned demonstration          PT Long Term Goals - 05/27/18 1203      PT LONG TERM GOAL #1   Title  Pt will be independent in her HEP and progression.     Time  6    Period  Weeks    Status  New    Target Date  07/08/18      PT LONG TERM GOAL #2   Title  Pt will be able to tolerate sitting for a full movie without having to stand due to pain.     Baseline  pt unable to sit an entire movie.     Time  6    Period  Weeks    Status  New  Target Date  07/08/18      PT LONG TERM GOAL #3   Title  Pt will improve her L hip abduction to 5/5 in order to improve functional mobility.     Time  6    Period  Weeks    Status  New    Target Date  07/08/18      PT LONG TERM GOAL #4   Title  Pt will be tolerate riding her mountain bike for greater than 20 minutes on uneven surfaces with pain </= 2/10.     Time  6    Period  Weeks    Status  New    Target Date  07/08/18            Plan - 05/30/18 1649    Clinical Impression Statement  Pt with first time follow-up after evaluation.  Pt is independent in initial HEP and had pain with bridging so PT modified to add leg motion to pelvic tilts.  Pt demonstrated good pelvic stability with exercise in the clinic today with minor to no cueing required after initial instruction.  Pt will work on strength and flexibility exercises and PT will dry needle next session.     Rehab Potential  Excellent    PT Frequency  2x / week    PT Duration  6 weeks    PT Treatment/Interventions  Dry needling;Moist Heat;Ultrasound;Balance training;Therapeutic exercise;Therapeutic activities;Functional mobility training;Patient/family education;Manual techniques;Passive range of motion;Taping    PT Next Visit Plan  dry needling to bil lumbar  multifidi and gluteals, review HEP    PT Home Exercise Plan  Access Code: [email or web address]       Patient will benefit from skilled therapeutic intervention in order to improve the following deficits and impairments:  Pain, Decreased strength, Decreased activity tolerance, Postural dysfunction, Decreased mobility  Visit Diagnosis: Chronic bilateral low back pain without sciatica  Muscle weakness (generalized)     Problem List Patient Active Problem List   Diagnosis Date Noted  . Physical exam 04/29/2017  . Polyarthralgia 03/29/2017  . Allergic rhinitis 12/10/2016  . Low back pain 12/10/2016  . HIP PAIN, RIGHT 10/18/2009  . TROCHANTERIC BURSITIS 10/18/2009  . SUBSCAPULARIS SPRAIN AND STRAIN 02/21/2009  . 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 Persistent disorder of initiating or maintaining sleep 12/23/2006  . MENISCUS INJURY 12/23/2006    Nicole Benson, PT 05/30/18 5:05 PM  [facility] [address], [place], [place], [address] Phone: [phone]   Fax:  [phone]  Name: Nicole Benson MRN: [id number] Date of Birth: 1950/08/14

## 2018-06-02 ENCOUNTER — Ambulatory Visit: Payer: PPO

## 2018-06-02 DIAGNOSIS — M545 Low back pain, unspecified: Secondary | ICD-10-CM

## 2018-06-02 DIAGNOSIS — G8929 Other chronic pain: Secondary | ICD-10-CM

## 2018-06-02 DIAGNOSIS — M6281 Muscle weakness (generalized): Secondary | ICD-10-CM

## 2018-06-02 NOTE — Patient Instructions (Signed)

## 2018-06-02 NOTE — Therapy (Signed)
[facility] [address], [place], [place], [address] Phone: [phone]   Fax:  [phone]  Physical Therapy Treatment  Patient Details  Name: Nicole Benson MRN: [id number] Date of Birth: [birth year] Referring Provider (PT): Magnus Sinning MD   Encounter Date: 06/02/2018  PT End of Session - 06/02/18 1228    Visit Number  3    Date for PT Re-Evaluation  07/28/18    PT Start Time  1149    PT Stop Time  1233   dry needling   PT Time Calculation (min)  44 min    Activity Tolerance  Patient tolerated treatment well    Behavior During Therapy  [facility] for tasks assessed/performed       Past Medical History:  Diagnosis Date  . Chronic lower back pain   . Heart murmur     Past Surgical History:  Procedure Laterality Date  . ABLATION     Radio frequency lower back  . COLONOSCOPY    . COLONOSCOPY W/ BIOPSIES     hyperplastic polyp  . KNEE SURGERY Right   . POLYPECTOMY      There were no vitals filed for this visit.  Subjective Assessment - 06/02/18 1152    Subjective  I am ready to try dry needling.  Exercises are going well.      Currently in Pain?  No/denies                       [facility] Adult PT Treatment/Exercise - 06/02/18 0001      Modalities   Modalities  Moist Heat      Moist Heat Therapy   Number Minutes Moist Heat  10 Minutes    Moist Heat Location  Lumbar Spine      Manual Therapy   Manual Therapy  Soft tissue mobilization;Myofascial release    Manual therapy comments  soft tissue elongation and trigger point release to bil lumbar parapinals and gluteals       Trigger Point Dry Needling - 06/02/18 1153    Consent Given?  Yes    Education Handout Provided  Yes    Muscles Treated Lower Body  Gluteus minimus;Gluteus maximus;Piriformis   bil lumbar multifidi   Gluteus Maximus Response  Twitch response elicited;Palpable increased muscle length    Gluteus Minimus Response   Twitch response elicited;Palpable increased muscle length    Piriformis Response  Twitch response elicited;Palpable increased muscle length           PT Education - 06/02/18 1153    Education Details  DN info    Person(s) Educated  Patient    Methods  Explanation;Demonstration;Handout    Comprehension  Verbalized understanding;Returned demonstration          PT Long Term Goals - 05/27/18 1203      PT LONG TERM GOAL #1   Title  Pt will be independent in her HEP and progression.     Time  6    Period  Weeks    Status  New    Target Date  07/08/18      PT LONG TERM GOAL #2   Title  Pt will be able to tolerate sitting for a full movie without having to stand due to pain.     Baseline  pt unable to sit an entire movie.     Time  6    Period  Weeks    Status  New  Target Date  07/08/18      PT LONG TERM GOAL #3   Title  Pt will improve her L hip abduction to 5/5 in order to improve functional mobility.     Time  6    Period  Weeks    Status  New    Target Date  07/08/18      PT LONG TERM GOAL #4   Title  Pt will be tolerate riding her mountain bike for greater than 20 minutes on uneven surfaces with pain </= 2/10.     Time  6    Period  Weeks    Status  New    Target Date  07/08/18            Plan - 06/02/18 1227    Clinical Impression Statement  Session focused today on dry needling to lumbar multifidi and bil gluteals to address tension and trigger points.  Pt with tension and trigger points bilaterally and demonstrated improved tissue mobility after dry needling today.  Pt remains active with bike riding and has HEP in place to address core strength, gluteal strength and flexibility.  Pt will return in 1-2 weeks to review HEP and repeat dry needling.      Rehab Potential  Excellent    PT Frequency  2x / week    PT Duration  6 weeks    PT Treatment/Interventions  Dry needling;Moist Heat;Ultrasound;Balance training;Therapeutic exercise;Therapeutic  activities;Functional mobility training;Patient/family education;Manual techniques;Passive range of motion;Taping    PT Next Visit Plan  assess response to dry needling to bil lumbar multifidi and gluteals, review HEP    PT Home Exercise Plan  Access Code: [email or web address]    Recommended Other Services  initial certification is signed    Consulted and Agree with Plan of Care  Patient       Patient will benefit from skilled therapeutic intervention in order to improve the following deficits and impairments:  Pain, Decreased strength, Decreased activity tolerance, Postural dysfunction, Decreased mobility  Visit Diagnosis: Chronic bilateral low back pain without sciatica  Muscle weakness (generalized)     Problem List Patient Active Problem List   Diagnosis Date Noted  . Physical exam 04/29/2017  . Polyarthralgia 03/29/2017  . Allergic rhinitis 12/10/2016  . Low back pain 12/10/2016  . HIP PAIN, RIGHT 10/18/2009  . TROCHANTERIC BURSITIS 10/18/2009  . SUBSCAPULARIS SPRAIN AND STRAIN 02/21/2009  . 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 Persistent disorder of initiating or maintaining sleep 12/23/2006  . MENISCUS INJURY 12/23/2006   Nicole Benson, PT 06/02/18 12:32 PM  [facility] [address] [place], [place], [address] Phone: [phone]   Fax:  [phone]  Name: Nicole Benson MRN: [id number] Date of Birth: Jan 11, 1951

## 2018-06-03 ENCOUNTER — Ambulatory Visit: Payer: PPO | Admitting: Physical Therapy

## 2018-06-23 ENCOUNTER — Ambulatory Visit: Payer: PPO | Attending: Physical Medicine and Rehabilitation

## 2018-06-23 ENCOUNTER — Encounter (INDEPENDENT_AMBULATORY_CARE_PROVIDER_SITE_OTHER): Payer: Self-pay | Admitting: Physical Medicine and Rehabilitation

## 2018-06-23 DIAGNOSIS — M6281 Muscle weakness (generalized): Secondary | ICD-10-CM | POA: Insufficient documentation

## 2018-06-23 DIAGNOSIS — M545 Low back pain: Secondary | ICD-10-CM | POA: Insufficient documentation

## 2018-06-23 DIAGNOSIS — G8929 Other chronic pain: Secondary | ICD-10-CM | POA: Diagnosis not present

## 2018-06-23 NOTE — Therapy (Signed)
[facility] [address], [place], [place], [address] Phone: [phone]   Fax:  [phone]  Physical Therapy Treatment  Patient Details  Name: Nicole Benson MRN: [id number] Date of Birth: Oct 08, 1950 Referring Provider (PT): Magnus Sinning MD   Encounter Date: 06/23/2018  PT End of Session - 06/23/18 0957    Visit Number  4    Date for PT Re-Evaluation  07/28/18    PT Start Time  0849    PT Stop Time  0953    PT Time Calculation (min)  64 min    Activity Tolerance  Patient tolerated treatment well    Behavior During Therapy  [facility] for tasks assessed/performed       Past Medical History:  Diagnosis Date  . Chronic lower back pain   . Heart murmur     Past Surgical History:  Procedure Laterality Date  . ABLATION     Radio frequency lower back  . COLONOSCOPY    . COLONOSCOPY W/ BIOPSIES     hyperplastic polyp  . KNEE SURGERY Right   . POLYPECTOMY      There were no vitals filed for this visit.  Subjective Assessment - 06/23/18 0852    Subjective  I am doing well.  The hip stretches are really helping.  The dry needling really help.  60% better    Currently in Pain?  No/denies    Pain Score  --   up to 5/10 with sitting/standing too long                      [facility] Adult PT Treatment/Exercise - 06/23/18 0001      Lumbar Exercises: Stretches   Piriformis Stretch  3 reps;20 seconds      Lumbar Exercises: Sidelying   Clam  20 reps    Clam Limitations  red band    Other Sidelying Lumbar Exercises  reverse clam red band 2x10      Knee/Hip Exercises: Standing   Lateral Step Up  Both;2 sets;10 reps;Hand Hold: 0;Step Height: 6"    Forward Step Up  Both;2 sets;10 reps;Hand Hold: 0;Step Height: 6"    Step Down  Both;2 sets;10 reps;Hand Hold: 0;Step Height: 6"      Modalities   Modalities  Moist Heat      Moist Heat Therapy   Number Minutes Moist Heat  10 Minutes    Moist Heat Location   Lumbar Spine      Manual Therapy   Manual Therapy  Soft tissue mobilization;Myofascial release    Manual therapy comments  soft tissue elongation and trigger point release to bil lumbar parapinals and gluteals       Trigger Point Dry Needling - 06/23/18 0956    Consent Given?  Yes    Muscles Treated Upper Body  Quadratus Lumborum   Rt only   Muscles Treated Lower Body  Gluteus maximus;Gluteus minimus;Piriformis   bil lumbar multifidi   Gluteus Maximus Response  Twitch response elicited;Palpable increased muscle length    Gluteus Minimus Response  Twitch response elicited;Palpable increased muscle length    Piriformis Response  Twitch response elicited;Palpable increased muscle length           PT Education - 06/23/18 0905    Education Details  Access Code: [email or web address], [place] for tissue mobilization at home    Person(s) Educated  Patient    Methods  Explanation;Demonstration;Handout    Comprehension  Returned demonstration;Verbalized understanding  PT Long Term Goals - 06/23/18 0854      PT LONG TERM GOAL #1   Title  Pt will be independent in her HEP and progression.     Baseline  independent in current HEP    Time  6    Period  Weeks    Status  On-going      PT LONG TERM GOAL #2   Title  Pt will be able to tolerate sitting for a full movie without having to stand due to pain.     Baseline  60% better    Time  6    Period  Weeks    Status  On-going      PT LONG TERM GOAL #4   Title  Pt will be tolerate riding her mountain bike for greater than 20 minutes on uneven surfaces with pain </= 2/10.     Baseline  no pain    Status  Achieved            Plan - 06/23/18 0857    Clinical Impression Statement  Pt is making steady progress with PT.  Pt has been compliant with HEP for core and hip strength and flexibility.  Clam shell and reverse clam were advanced to red theraband today and PT added step-ups (forward/lateral) and step downs for eccentric  strength and stability.  Pt with good technique with all new exercises.  Pt with tension and trigger points in Rt>Lt lumbar paraspinals gluteals and quadratus and demonstrated and reported reduced pain and improved tissue mobility after dry needling and manual therapy today.  Pt will continue to benefit from skilled PT for progression of HEP and manual therapy to address pain and tissue mobility.     Rehab Potential  Excellent    PT Frequency  2x / week    PT Duration  6 weeks    PT Treatment/Interventions  Dry needling;Moist Heat;Ultrasound;Balance training;Therapeutic exercise;Therapeutic activities;Functional mobility training;Patient/family education;Manual techniques;Passive range of motion;Taping    PT Next Visit Plan  assess response to dry needling to bil lumbar multifidi and gluteals, review HEP and advance as needed    PT Home Exercise Plan  Access Code: [email or web address]    Consulted and Agree with Plan of Care  Patient       Patient will benefit from skilled therapeutic intervention in order to improve the following deficits and impairments:  Pain, Decreased strength, Decreased activity tolerance, Postural dysfunction, Decreased mobility  Visit Diagnosis: Chronic bilateral low back pain without sciatica  Muscle weakness (generalized)     Problem List Patient Active Problem List   Diagnosis Date Noted  . Physical exam 04/29/2017  . Polyarthralgia 03/29/2017  . Allergic rhinitis 12/10/2016  . Low back pain 12/10/2016  . HIP PAIN, RIGHT 10/18/2009  . TROCHANTERIC BURSITIS 10/18/2009  . SUBSCAPULARIS SPRAIN AND STRAIN 02/21/2009  . 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 Persistent disorder of initiating or maintaining sleep 12/23/2006  . MENISCUS INJURY 12/23/2006    Sigurd Sos, PT 06/23/18 10:03 AM  [facility] [address] [place], [place], [address] Phone: [phone]   Fax:  [phone]  Name: Nicole Benson MRN:  [id number] Date of Birth: 10-Dec-1950

## 2018-06-23 NOTE — Patient Instructions (Signed)
Access Code: [email or web address]  URL: [email or web address]  Date: 06/23/2018  Prepared by: Sigurd Sos   Exercises Step Up - 10 reps - 2 sets - 2x daily - 7x weekly Forward Step Down - 10 reps - 2 sets - 2x daily - 7x weekly Lateral Step Up - 10 reps - 2 sets - 2x daily - 7x weekly Supine Figure 4 Piriformis Stretch - 3 reps - 20 hold - 2x daily - 7x weekly

## 2018-06-30 ENCOUNTER — Ambulatory Visit (INDEPENDENT_AMBULATORY_CARE_PROVIDER_SITE_OTHER): Payer: PPO | Admitting: Family Medicine

## 2018-06-30 ENCOUNTER — Encounter: Payer: Self-pay | Admitting: Family Medicine

## 2018-06-30 ENCOUNTER — Other Ambulatory Visit: Payer: Self-pay

## 2018-06-30 ENCOUNTER — Ambulatory Visit: Payer: PPO

## 2018-06-30 VITALS — BP 122/82 | HR 74 | Temp 98.1°F | Resp 16 | Ht 65.0 in | Wt 131.0 lb

## 2018-06-30 DIAGNOSIS — M545 Low back pain: Secondary | ICD-10-CM | POA: Diagnosis not present

## 2018-06-30 DIAGNOSIS — M792 Neuralgia and neuritis, unspecified: Secondary | ICD-10-CM | POA: Diagnosis not present

## 2018-06-30 DIAGNOSIS — R2230 Localized swelling, mass and lump, unspecified upper limb: Secondary | ICD-10-CM

## 2018-06-30 DIAGNOSIS — M6281 Muscle weakness (generalized): Secondary | ICD-10-CM

## 2018-06-30 DIAGNOSIS — M7989 Other specified soft tissue disorders: Secondary | ICD-10-CM

## 2018-06-30 DIAGNOSIS — G8929 Other chronic pain: Secondary | ICD-10-CM

## 2018-06-30 NOTE — Progress Notes (Signed)
   Subjective:    Patient ID: Nicole Benson, female    DOB: [birth year], 68 y.o.   MRN: [id number]  HPI Arm pain- pt noticed 'tingly nerve pain' of R upper arm starting around Thanksgiving.  Initially felt it was due to repetitive motion.  No relief w/ stretching or massage.  Pain starts in shoulder and over biceps tendon and radiates down R arm.  No relief w/ Meloxicam.  No pattern to pain or particular movement that causes pain.  Soft tissue mass- R elbow.  Just noticed this week.  Not painful.  Pt has hx of cysts that have been removed from hands previously [name])   Review of Systems For ROS see HPI     Objective:   Physical Exam Vitals signs reviewed.  Constitutional:      General: She is not in acute distress.    Appearance: Normal appearance.  HENT:     Head: Normocephalic and atraumatic.  Neck:     Musculoskeletal: Normal range of motion and neck supple. No neck rigidity.  Cardiovascular:     Pulses: Normal pulses.  Musculoskeletal:     Comments: (-) impingement signs on R Full ROM of R shoulder No TTP over lateral or medial epicondyle Small, ill defined but freely mobile soft tissue mass just inferior to lateral epicondyle on R  Neurological:     General: No focal deficit present.     Mental Status: She is alert and oriented to person, place, and time.     Cranial Nerves: No cranial nerve deficit.     Sensory: No sensory deficit.     Motor: No weakness.     Coordination: Coordination normal.     Deep Tendon Reflexes: Reflexes normal.           Assessment & Plan:  Radicular pain of R arm- no abnormality of neck or shoulder noted.  Unable to reproduce her sxs on exam today.  She is not able to relay a particular time of day or movement that causes her sxs.  She may have a ganglion cyst of R forearm that could contribute to sxs.  Will refer to Sports Med for complete evaluation.  Soft tissue mass- ganglion vs lipoma vs other.  Offered [place] assessment.  Pt declined.   Preferred Sports Med referral.  Also has appt upcoming w/ [name] on 1/30.  Encouraged her to mention this.  Pt expressed understanding and is in agreement w/ plan.

## 2018-06-30 NOTE — Therapy (Signed)
[facility] [address] [place], [place], [address] Phone: [phone]   Fax:  [phone]  Physical Therapy Treatment  Patient Details  Name: Britnie Colville MRN: [id number] Date of Birth: 1950/09/16 Referring Provider (PT): Magnus Sinning MD   Encounter Date: 06/30/2018  PT End of Session - 06/30/18 0816    Visit Number  5    Date for PT Re-Evaluation  07/28/18    PT Start Time  0800    PT Stop Time  0843   dry needling   PT Time Calculation (min)  43 min    Activity Tolerance  Patient tolerated treatment well    Behavior During Therapy  [facility] for tasks assessed/performed       Past Medical History:  Diagnosis Date  . Chronic lower back pain   . Heart murmur     Past Surgical History:  Procedure Laterality Date  . ABLATION     Radio frequency lower back  . COLONOSCOPY    . COLONOSCOPY W/ BIOPSIES     hyperplastic polyp  . KNEE SURGERY Right   . POLYPECTOMY      There were no vitals filed for this visit.  Subjective Assessment - 06/30/18 0800    Subjective  I'm doing good.  I had a cold and when I coughed it really hurt my back so I had a little relapse.      Currently in Pain?  No/denies         [facility] PT Assessment - 06/30/18 0001      Strength   Left Hip ABduction  4+/5                   [facility] Adult PT Treatment/Exercise - 06/30/18 0001      Modalities   Modalities  Moist Heat      Moist Heat Therapy   Number Minutes Moist Heat  10 Minutes    Moist Heat Location  Lumbar Spine      Manual Therapy   Manual Therapy  Soft tissue mobilization;Myofascial release    Manual therapy comments  soft tissue elongation and trigger point release to bil lumbar parapinals and gluteals       Trigger Point Dry Needling - 06/30/18 0807    Consent Given?  Yes    Muscles Treated Upper Body  --    Muscles Treated Lower Body  Gluteus minimus;Gluteus maximus;Piriformis   bil lumbar  multifidi   Gluteus Maximus Response  Twitch response elicited;Palpable increased muscle length   Rt only   Gluteus Minimus Response  Twitch response elicited;Palpable increased muscle length   Rt only   Piriformis Response  Twitch response elicited;Palpable increased muscle length   Rt only               PT Long Term Goals - 06/30/18 0804      PT LONG TERM GOAL #1   Title  Pt will be independent in her HEP and progression.     Baseline  independent in current HEP    Time  6    Period  Weeks    Status  On-going      PT LONG TERM GOAL #2   Title  Pt will be able to tolerate sitting for a full movie without having to stand due to pain.     Baseline  60% better- still challenging    Time  6    Period  Weeks    Status  On-going      PT LONG TERM GOAL #3   Title  Pt will improve her L hip abduction to 5/5 in order to improve functional mobility.     Baseline  4+/5    Time  6    Period  Weeks    Status  On-going            Plan - 06/30/18 1324    Clinical Impression Statement  Pt reports 60-70% overall improvement in symptoms since the start of care.  Pt had a cold last week and coughing increased her back pain slightly.  Pt is independent and compliant with her HEP and verbal review was performed today.  Lt hip abduction strength is improved to 4+/5.  Session focused on dry needling and manual therapy.  Pt with tension in Rt>Lt lumbar multifidi and gluteals.  Pt demonstrated improved mobility after manual therapy today.  Pt will continue to benefit from skilled PT for advancement of HEP and manual therapy to address tension and trigger points.      Rehab Potential  Excellent    PT Frequency  2x / week    PT Duration  6 weeks    PT Treatment/Interventions  Dry needling;Moist Heat;Ultrasound;Balance training;Therapeutic exercise;Therapeutic activities;Functional mobility training;Patient/family education;Manual techniques;Passive range of motion;Taping    PT Next Visit  Plan  assess response to dry needling to bil lumbar multifidi and gluteals, review HEP and advance as needed    PT Home Exercise Plan  Access Code: [email or web address]    Consulted and Agree with Plan of Care  Patient       Patient will benefit from skilled therapeutic intervention in order to improve the following deficits and impairments:  Pain, Decreased strength, Decreased activity tolerance, Postural dysfunction, Decreased mobility  Visit Diagnosis: Chronic bilateral low back pain without sciatica  Muscle weakness (generalized)     Problem List Patient Active Problem List   Diagnosis Date Noted  . Physical exam 04/29/2017  . Polyarthralgia 03/29/2017  . Allergic rhinitis 12/10/2016  . Low back pain 12/10/2016  . HIP PAIN, RIGHT 10/18/2009  . TROCHANTERIC BURSITIS 10/18/2009  . SUBSCAPULARIS SPRAIN AND STRAIN 02/21/2009  . 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 Persistent disorder of initiating or maintaining sleep 12/23/2006  . MENISCUS INJURY 12/23/2006   Sigurd Sos, PT 06/30/18 8:43 AM  [facility] [address] [place], [place], [address] Phone: [phone]   Fax:  [phone]  Name: Juanetta Negash MRN: [id number] Date of Birth: 1951-01-05

## 2018-06-30 NOTE — Patient Instructions (Signed)
Schedule your complete physical in 3-4 months (we are overdue) We'll call you with your Sports Med appt Continue tylenol or ibuprofen as needed for pain Call with any questions or concerns Happy New Year!

## 2018-07-07 ENCOUNTER — Encounter: Payer: Self-pay | Admitting: Sports Medicine

## 2018-07-07 ENCOUNTER — Ambulatory Visit: Payer: PPO

## 2018-07-07 ENCOUNTER — Ambulatory Visit: Payer: Self-pay

## 2018-07-07 ENCOUNTER — Ambulatory Visit (INDEPENDENT_AMBULATORY_CARE_PROVIDER_SITE_OTHER): Payer: PPO | Admitting: Sports Medicine

## 2018-07-07 VITALS — BP 126/76 | HR 61 | Ht 65.0 in | Wt 133.6 lb

## 2018-07-07 DIAGNOSIS — M7711 Lateral epicondylitis, right elbow: Secondary | ICD-10-CM | POA: Diagnosis not present

## 2018-07-07 DIAGNOSIS — M79631 Pain in right forearm: Secondary | ICD-10-CM | POA: Diagnosis not present

## 2018-07-07 DIAGNOSIS — M545 Low back pain, unspecified: Secondary | ICD-10-CM

## 2018-07-07 DIAGNOSIS — M6281 Muscle weakness (generalized): Secondary | ICD-10-CM

## 2018-07-07 DIAGNOSIS — M25511 Pain in right shoulder: Secondary | ICD-10-CM | POA: Diagnosis not present

## 2018-07-07 DIAGNOSIS — G8929 Other chronic pain: Secondary | ICD-10-CM

## 2018-07-07 NOTE — Procedures (Signed)
PROCEDURE NOTE:  Ultrasound Guided: Injection: Left elbow Images were obtained and interpreted by myself, Teresa Coombs, DO  Images have been saved and stored to PACS system. Images obtained on: GE S7 Ultrasound machine    ULTRASOUND FINDINGS:  Extensor tendons appear normal and intact however there is a calcification just distal to the lateral epicondyle consistent with calcific tendinosis of the extensor tendons.  DESCRIPTION OF PROCEDURE:  The patient's clinical condition is marked by substantial pain and/or significant functional disability. Other conservative therapy has not provided relief, is contraindicated, or not appropriate. There is a reasonable likelihood that injection will significantly improve the patient's pain and/or functional impairment.   After discussing the risks, benefits and expected outcomes of the injection and all questions were reviewed and answered, the patient wished to undergo the above named procedure.  Verbal consent was obtained.  The ultrasound was used to identify the target structure and adjacent neurovascular structures. The skin was then prepped in sterile fashion and the target structure was injected under direct visualization using sterile technique as below:  Single injection performed as below: PREP: Alcohol and Ethel Chloride APPROACH:direct, single injection, 22g 1.5 in. INJECTATE: 0.5 cc 0.5% Marcaine and 0.5 cc 40mg /mL DepoMedrol ASPIRATE: None DRESSING: Band-Aid  Post procedural instructions including recommending icing and warning signs for infection were reviewed.    This procedure was well tolerated and there were no complications.   IMPRESSION: Succesful Ultrasound Guided: Injection

## 2018-07-07 NOTE — Patient Instructions (Addendum)
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
## 2018-07-07 NOTE — Therapy (Signed)
[facility] [address], [place], [place], [address] Phone: [phone]   Fax:  [phone]  Physical Therapy Treatment  Patient Details  Name: Nicole Benson MRN: [id number] Date of Birth: 25-Jan-1951 Referring Provider (PT): Magnus Sinning MD   Encounter Date: 07/07/2018  PT End of Session - 07/07/18 1011    Visit Number  6    Date for PT Re-Evaluation  07/28/18    PT Start Time  0934   dry needling   PT Stop Time  1019    PT Time Calculation (min)  45 min       Past Medical History:  Diagnosis Date  . Chronic lower back pain   . Heart murmur     Past Surgical History:  Procedure Laterality Date  . ABLATION     Radio frequency lower back  . COLONOSCOPY    . COLONOSCOPY W/ BIOPSIES     hyperplastic polyp  . KNEE SURGERY Right   . POLYPECTOMY      There were no vitals filed for this visit.  Subjective Assessment - 07/07/18 0937    Subjective  85% overall improvement.  Dry needling is helping.      Currently in Pain?  No/denies         [facility] PT Assessment - 07/07/18 0001      Assessment   Medical Diagnosis  low back pain    Referring Provider (PT)  Magnus Sinning MD    Onset Date/Surgical Date  --   several years ago     Cognition   Overall Cognitive Status  Within Functional Limits for tasks assessed      Observation/Other Assessments   Focus on Therapeutic Outcomes (FOTO)   17% limitation                   [facility] Adult PT Treatment/Exercise - 07/07/18 0001      Modalities   Modalities  Moist Heat      Moist Heat Therapy   Number Minutes Moist Heat  10 Minutes    Moist Heat Location  Lumbar Spine      Manual Therapy   Manual Therapy  Soft tissue mobilization;Myofascial release    Manual therapy comments  soft tissue elongation and trigger point release to bil lumbar parapinals and gluteals       Trigger Point Dry Needling - 07/07/18 0943    Consent Given?  Yes     Muscles Treated Upper Body  --   bil lumbar paraspinals   Muscles Treated Lower Body  Gluteus minimus;Gluteus maximus;Piriformis   Rt only   Gluteus Maximus Response  Twitch response elicited;Palpable increased muscle length    Gluteus Minimus Response  Twitch response elicited;Palpable increased muscle length    Piriformis Response  Twitch response elicited;Palpable increased muscle length                PT Long Term Goals - 06/30/18 0804      PT LONG TERM GOAL #1   Title  Pt will be independent in her HEP and progression.     Baseline  independent in current HEP    Time  6    Period  Weeks    Status  On-going      PT LONG TERM GOAL #2   Title  Pt will be able to tolerate sitting for a full movie without having to stand due to pain.     Baseline  60% better- still  challenging    Time  6    Period  Weeks    Status  On-going      PT LONG TERM GOAL #3   Title  Pt will improve her L hip abduction to 5/5 in order to improve functional mobility.     Baseline  4+/5    Time  6    Period  Weeks    Status  On-going            Plan - 07/07/18 1010    Clinical Impression Statement  Pt reports 85-90% overall improvement in lumbar symptoms since the start of care.  Pt is independent and compliant in her HEP for core strength, hip strength and flexibility.  FOTO score is reduced to 17% limitation.  Pt with some increased tension in lumbar paraspinals today due to working on putting up a fence this week and being sick therefore not exercising as much.  Pt will attend 1 more session likely for dry needling and to finalize HEP.      Rehab Potential  Excellent    PT Frequency  2x / week    PT Duration  6 weeks    PT Treatment/Interventions  Dry needling;Moist Heat;Ultrasound;Balance training;Therapeutic exercise;Therapeutic activities;Functional mobility training;Patient/family education;Manual techniques;Passive range of motion;Taping    PT Next Visit Plan  assess response to  dry needling to bil lumbar multifidi and gluteals, review HEP and advance as needed.  1 more session likely    PT Home Exercise Plan  Access Code: [email or web address]    Consulted and Agree with Plan of Care  Patient       Patient will benefit from skilled therapeutic intervention in order to improve the following deficits and impairments:  Pain, Decreased strength, Decreased activity tolerance, Postural dysfunction, Decreased mobility  Visit Diagnosis: Chronic bilateral low back pain without sciatica  Muscle weakness (generalized)     Problem List Patient Active Problem List   Diagnosis Date Noted  . Physical exam 04/29/2017  . Polyarthralgia 03/29/2017  . Allergic rhinitis 12/10/2016  . Low back pain 12/10/2016  . HIP PAIN, RIGHT 10/18/2009  . SUBSCAPULARIS SPRAIN AND STRAIN 02/21/2009  . 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 Persistent disorder of initiating or maintaining sleep 12/23/2006  . MENISCUS INJURY 12/23/2006    Nicole Benson, PT 07/07/18 10:13 AM   [facility] [address], [place], [place], [address] Phone: [phone]   Fax:  [phone]  Name: Nicole Benson MRN: [id number] Date of Birth: 26-Jul-1950

## 2018-07-14 ENCOUNTER — Ambulatory Visit: Payer: PPO

## 2018-07-14 DIAGNOSIS — G5601 Carpal tunnel syndrome, right upper limb: Secondary | ICD-10-CM | POA: Diagnosis not present

## 2018-07-14 DIAGNOSIS — M545 Low back pain: Secondary | ICD-10-CM | POA: Diagnosis not present

## 2018-07-14 DIAGNOSIS — G8929 Other chronic pain: Secondary | ICD-10-CM

## 2018-07-14 DIAGNOSIS — M6281 Muscle weakness (generalized): Secondary | ICD-10-CM

## 2018-07-14 NOTE — Therapy (Signed)
[facility] [address] [place], [place], [address] Phone: [phone]   Fax:  [phone]  Physical Therapy Treatment  Patient Details  Name: Nicole Benson MRN: [id number] Date of Birth: 1951-04-11 Referring Provider (PT): Magnus Sinning MD   Encounter Date: 07/14/2018  PT End of Session - 07/14/18 0927    Visit Number  7    Date for PT Re-Evaluation  07/28/18    PT Start Time  0847    PT Stop Time  0934   dry needling   PT Time Calculation (min)  47 min    Activity Tolerance  Patient tolerated treatment well    Behavior During Therapy  [facility] for tasks assessed/performed       Past Medical History:  Diagnosis Date  . Chronic lower back pain   . Heart murmur     Past Surgical History:  Procedure Laterality Date  . ABLATION     Radio frequency lower back  . COLONOSCOPY    . COLONOSCOPY W/ BIOPSIES     hyperplastic polyp  . KNEE SURGERY Right   . POLYPECTOMY      There were no vitals filed for this visit.  Subjective Assessment - 07/14/18 0850    Subjective  I still have a tight spot in the Rt gluteals and low back.  I am going to come one more time for my back for dry needling after I take a trip.      Currently in Pain?  Yes    Pain Score  1     Pain Location  Back    Pain Orientation  Right;Lower    Pain Descriptors / Indicators  Aching;Sore    Pain Onset  More than a month ago    Pain Frequency  Constant    Aggravating Factors   sitting too long, morning and evening    Pain Relieving Factors  keep moving                       [facility] Adult PT Treatment/Exercise - 07/14/18 0001      Modalities   Modalities  Moist Heat      Moist Heat Therapy   Number Minutes Moist Heat  10 Minutes    Moist Heat Location  Lumbar Spine      Manual Therapy   Manual Therapy  Soft tissue mobilization;Myofascial release    Manual therapy comments  soft tissue elongation and trigger point  release to bil lumbar parapinals and gluteals       Trigger Point Dry Needling - 07/14/18 0853    Consent Given?  Yes    Muscles Treated Upper Body  Quadratus Lumborum   bil lumbar paraspinals   Muscles Treated Lower Body  Gluteus minimus;Gluteus maximus   Rt only   Gluteus Maximus Response  Twitch response elicited;Palpable increased muscle length   Rt only   Gluteus Minimus Response  Twitch response elicited;Palpable increased muscle length    Piriformis Response  --                PT Long Term Goals - 06/30/18 0804      PT LONG TERM GOAL #1   Title  Pt will be independent in her HEP and progression.     Baseline  independent in current HEP    Time  6    Period  Weeks    Status  On-going      PT LONG  TERM GOAL #2   Title  Pt will be able to tolerate sitting for a full movie without having to stand due to pain.     Baseline  60% better- still challenging    Time  6    Period  Weeks    Status  On-going      PT LONG TERM GOAL #3   Title  Pt will improve her L hip abduction to 5/5 in order to improve functional mobility.     Baseline  4+/5    Time  6    Period  Weeks    Status  On-going            Plan - 07/14/18 2542    Clinical Impression Statement  Pt with Rt gluteal, lumbar paraspinal and quadratus tension and pain.  Pt reports that she has most pain (5/10) with sitting long periods and early morning and late in the day.  Pt has a comprehensive HEP in place and is performing consistently.  Pt with tension and trigger points in Rt quadratus and gluteals today and demonstrated improved tissue mobility after dry needling and manual today.   Pt will attend 1 more session for dry needling and to finalize HEP.    Rehab Potential  Excellent    PT Frequency  2x / week    PT Duration  6 weeks    PT Treatment/Interventions  Dry needling;Moist Heat;Ultrasound;Balance training;Therapeutic exercise;Therapeutic activities;Functional mobility training;Patient/family  education;Manual techniques;Passive range of motion;Taping    PT Next Visit Plan  assess response to dry needling to bil lumbar multifidi and gluteals, review HEP and advance as needed.  1 more session likely.  evaluate for Rt UE next session    PT Home Exercise Plan  Access Code: [email or web address]    Consulted and Agree with Plan of Care  Patient       Patient will benefit from skilled therapeutic intervention in order to improve the following deficits and impairments:  Pain, Decreased strength, Decreased activity tolerance, Postural dysfunction, Decreased mobility  Visit Diagnosis: Chronic bilateral low back pain without sciatica  Muscle weakness (generalized)     Problem List Patient Active Problem List   Diagnosis Date Noted  . Physical exam 04/29/2017  . Polyarthralgia 03/29/2017  . Allergic rhinitis 12/10/2016  . Low back pain 12/10/2016  . HIP PAIN, RIGHT 10/18/2009  . SUBSCAPULARIS SPRAIN AND STRAIN 02/21/2009  . 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 Persistent disorder of initiating or maintaining sleep 12/23/2006  . MENISCUS INJURY 12/23/2006   Sigurd Sos, PT 07/14/18 9:28 AM  [facility] [address] [place], [place], [address] Phone: [phone]   Fax:  [phone]  Name: Brinae Woods MRN: [id number] Date of Birth: 1950/11/18

## 2018-07-17 ENCOUNTER — Encounter: Payer: Self-pay | Admitting: Sports Medicine

## 2018-07-18 ENCOUNTER — Ambulatory Visit: Payer: PPO | Attending: Sports Medicine

## 2018-07-18 DIAGNOSIS — M6281 Muscle weakness (generalized): Secondary | ICD-10-CM | POA: Diagnosis not present

## 2018-07-18 DIAGNOSIS — M79601 Pain in right arm: Secondary | ICD-10-CM | POA: Diagnosis not present

## 2018-07-18 DIAGNOSIS — R293 Abnormal posture: Secondary | ICD-10-CM | POA: Diagnosis not present

## 2018-07-18 DIAGNOSIS — M542 Cervicalgia: Secondary | ICD-10-CM | POA: Insufficient documentation

## 2018-07-18 DIAGNOSIS — M79631 Pain in right forearm: Secondary | ICD-10-CM | POA: Diagnosis not present

## 2018-07-18 DIAGNOSIS — R252 Cramp and spasm: Secondary | ICD-10-CM

## 2018-07-18 NOTE — Patient Instructions (Signed)
Access Code: [email or web address]  URL: [email or web address]  Date: 07/18/2018  Prepared by: Sigurd Sos   Exercises Seated Cervical Flexion AROM - 3 reps - 1 sets - 20 hold - 3x daily                            - 7x weekly Seated Cervical Sidebending AROM - 3 reps - 1 sets - 20 hold - 3x daily - 7x weekly Seated Cervical Rotation AROM - 3 reps - 1 sets - 20 hold - 3x daily - 7x weekly Seated Correct Posture - 10 reps - 3 sets - 1x daily - 7x weekly Seated Scapular Retraction - 10 reps - 1 sets - 5 hold - 5x daily - 7x weekly

## 2018-07-18 NOTE — Therapy (Signed)
[facility] [address], [place], [place], [address] Phone: [phone]   Fax:  [phone]  Physical Therapy Evaluation  Patient Details  Name: Nicole Benson MRN: [id number] Date of Birth: December 17, 1950 Referring Provider (PT): Teresa Coombs, MD   Encounter Date: 07/18/2018  PT End of Session - 07/18/18 0926    Visit Number  1   for Rt UE   Date for PT Re-Evaluation  09/12/18    PT Start Time  0848    PT Stop Time  0927    PT Time Calculation (min)  39 min    Activity Tolerance  Patient tolerated treatment well    Behavior During Therapy  [facility] for tasks assessed/performed       Past Medical History:  Diagnosis Date  . Chronic lower back pain   . Heart murmur     Past Surgical History:  Procedure Laterality Date  . ABLATION     Radio frequency lower back  . COLONOSCOPY    . COLONOSCOPY W/ BIOPSIES     hyperplastic polyp  . KNEE SURGERY Right   . POLYPECTOMY      There were no vitals filed for this visit.   Subjective Assessment - 07/18/18 0854    Subjective  Pt is a Rt hand dominant female who presents to PT with complaints of Rt UE weakness and tingling that began 04/2018 without incident or injury.  Pt had injection into the Rt elbow and pt denies any change in radiculopathy.      Patient Stated Goals  reduce Rt UE tingling, improve use overhead,     Currently in Pain?  No/denies    Multiple Pain Sites  Yes    Pain Score  0   3/10 when symptoms are present   Pain Location  Arm    Pain Orientation  Right    Pain Descriptors / Indicators  Tingling    Pain Type  Chronic pain    Pain Onset  More than a month ago    Pain Frequency  Intermittent    Aggravating Factors   at night, in bed, intermittent during the day    Pain Relieving Factors  intermittent during the day- short period of time, not sure what makes it go away         [facility] PT Assessment - 07/18/18 0001      Assessment   Medical  Diagnosis  Rt forearm pain    Referring Provider (PT)  Teresa Coombs, MD    Onset Date/Surgical Date  04/29/18    Hand Dominance  Right    Next MD Visit  08/04/18    Prior Therapy  not for Rt UE      Precautions   Precautions  None      Restrictions   Weight Bearing Restrictions  No      Balance Screen   Has the patient fallen in the past 6 months  No    Has the patient had a decrease in activity level because of a fear of falling?   No    Is the patient reluctant to leave their home because of a fear of falling?   No      Home Environment   Living Environment  Private residence    Living Arrangements  Spouse/significant other    Home Access  Level entry    [place]  Two level;Bed/bath upstairs      Prior Function   Level of  Independence  Independent    Vocation  Retired    Leisure  mountain bike, read, listen to music and go to concerts, spend time with family      Cognition   Overall Cognitive Status  Within Functional Limits for tasks assessed      Observation/Other Assessments   Focus on Therapeutic Outcomes (FOTO)   21% limitation      Posture/Postural Control   Posture/Postural Control  Postural limitations    Postural Limitations  Forward head    Posture Comments  scapular elevation bilaterally      ROM / Strength   AROM / PROM / Strength  AROM;PROM;Strength      AROM   Overall AROM   Within functional limits for tasks performed    Overall AROM Comments  full cervical A/ROM with Rt UE radiculopathy with Rt sidebending.  UE A/ OMR is full without pain bilaterally      PROM   Overall PROM   Within functional limits for tasks performed    Overall PROM Comments  full cervical and Rt shoulder A/ROM.  Pt reports relief of Rt arm pain with glenohumeral joint distraction.  Full A/ROM of Rt wrist and elbow without pain       Strength   Overall Strength  Deficits    Overall Strength Comments  Lt arm 4+/5 to 5/5 throughout    Strength Assessment Site   Forearm;Elbow;Shoulder    Right/Left Shoulder  Right    Right Shoulder Flexion  4/5    Right Shoulder Extension  5/5    Right Shoulder ABduction  4/5    Right Shoulder Internal Rotation  4+/5    Right Shoulder External Rotation  4+/5    Right/Left Elbow  Right    Right Elbow Flexion  5/5    Right Elbow Extension  5/5      Palpation   Spinal mobility  reduced PA mobility in the cervical spine without pain    Palpation comment  pt with trigger points and pain in bil upper traps, Rt shoulder external rotators and lateral deltoid.  No palpable tenderness over Rt elbow, forearm and wrist      Special Tests    Special Tests  Cervical;Rotator Cuff Impingement    Cervical Tests  Dictraction    Rotator Cuff Impingment tests  Neer impingement test      Distraction Test   Findngs  Negative    side  Right      Neer Impingement test    Findings  Positive    Side  Right                Objective measurements completed on examination: See above findings.              PT Education - 07/18/18 0924    Education Details  Access Code: [email or web address]    Person(s) Educated  Patient    Methods  Explanation;Demonstration;Handout    Comprehension  Verbalized understanding;Returned demonstration       PT Short Term Goals - 07/18/18 0935      PT SHORT TERM GOAL #1   Title  be independent in initial HEP for cervical spine and Rt arm    Time  4    Period  Weeks    Status  New    Target Date  08/15/18      PT SHORT TERM GOAL #2   Title  report a 25% reduction in Rt UE radiculopathy at night  Time  4    Period  Weeks    Status  New    Target Date  08/15/18        PT Long Term Goals - 07/18/18 0936      PT LONG TERM GOAL #1   Title  Pt will be independent in her HEP and progression.     Time  8    Period  Weeks    Status  New   for Rt UE   Target Date  09/12/18      PT LONG TERM GOAL #2   Title  Pt will be able to tolerate sitting for a full movie without having  to stand due to pain.     Baseline  60% better- still challenging    Time  6    Period  Weeks    Status  On-going    Target Date  07/28/18      PT LONG TERM GOAL #3   Title  Pt will improve her L hip abduction to 5/5 in order to improve functional mobility.     Baseline  4+/5    Time  6    Period  Weeks    Status  On-going    Target Date  07/28/18      PT LONG TERM GOAL #4   Title  report a 60% reduction in Rt UE radiculopathy at night     Baseline  --    Time  8    Period  Weeks    Status  New    Target Date  09/12/18      PT LONG TERM GOAL #5   Title  demonstrate 4+/5 to 5/5 Rt UE strength to improve endurance with use overhead    Time  8    Period  Weeks    Status  New    Target Date  09/12/18      Additional Long Term Goals   Additional Long Term Goals  Yes      PT LONG TERM GOAL #6   Title  verbalize neutral seated posture > 75% of the time to reduce impingment     Time  8    Period  Weeks    Status  New    Target Date  09/12/18             Plan - 07/18/18 3220    Clinical Impression Statement  Pt is a Rt hand dominant female who presents to PT with Rt UE pain that began 04/2018 without cause.  Pt had injection into Rt elbow last week and reports it has helped to reduce tension in the Rt forearm.  Pt with complaint of 3/10 Rt UE tingling that is present at night.  Pt also reports intermittent Rt UE radiculopathy throughout the day and is not able to determine the cause.  Pt demonstrates forward head and rounded shoulder posture in sitting with tension and trigger points in posterior shoulder, upper traps and deltoid on the Rt.  Pt with reduced strength in the Rt shoulder vs the Lt and reported limited endurance for use overhead.  Pt will benefit from skilled PT for postural strength, flexibility, manual to address trigger points and pain.      Clinical Presentation  Stable    Clinical Decision Making  Low    Rehab Potential  Excellent    PT Frequency  2x /  week    PT Duration  6 weeks  PT Treatment/Interventions  Dry needling;Moist Heat;Ultrasound;Balance training;Therapeutic exercise;Therapeutic activities;Functional mobility training;Patient/family education;Manual techniques;Passive range of motion;Taping;Traction;ADLs/Self Care Home Management;Electrical Stimulation;Neuromuscular re-education    PT Next Visit Plan  Postural strength, pec stretch, dry needling to upper traps and Rt shoulder/forearm, review HEP    PT Home Exercise Plan  Access Code: [email or web address]    Consulted and Agree with Plan of Care  Patient       Patient will benefit from skilled therapeutic intervention in order to improve the following deficits and impairments:  Pain, Decreased strength, Decreased activity tolerance, Postural dysfunction, Decreased mobility, Improper body mechanics, Increased muscle spasms  Visit Diagnosis: Cramp and spasm - Plan: PT plan of care cert/re-cert  Pain in right arm - Plan: PT plan of care cert/re-cert  Cervicalgia - Plan: PT plan of care cert/re-cert  Abnormal posture - Plan: PT plan of care cert/re-cert     Problem List Patient Active Problem List   Diagnosis Date Noted  . Physical exam 04/29/2017  . Polyarthralgia 03/29/2017  . Allergic rhinitis 12/10/2016  . Low back pain 12/10/2016  . HIP PAIN, RIGHT 10/18/2009  . SUBSCAPULARIS SPRAIN AND STRAIN 02/21/2009  . 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 Persistent disorder of initiating or maintaining sleep 12/23/2006  . MENISCUS INJURY 12/23/2006    Sigurd Sos, PT 07/18/18 9:47 AM  [facility] [address], [place], [place], [address] Phone: [phone]   Fax:  [phone]  Name: Nicole Benson MRN: [id number] Date of Birth: 11/22/1950

## 2018-07-19 DIAGNOSIS — L821 Other seborrheic keratosis: Secondary | ICD-10-CM | POA: Diagnosis not present

## 2018-07-19 DIAGNOSIS — D485 Neoplasm of uncertain behavior of skin: Secondary | ICD-10-CM | POA: Diagnosis not present

## 2018-07-19 DIAGNOSIS — B079 Viral wart, unspecified: Secondary | ICD-10-CM | POA: Diagnosis not present

## 2018-07-19 DIAGNOSIS — D229 Melanocytic nevi, unspecified: Secondary | ICD-10-CM | POA: Diagnosis not present

## 2018-07-26 ENCOUNTER — Ambulatory Visit: Payer: PPO | Attending: Physical Medicine and Rehabilitation

## 2018-07-26 DIAGNOSIS — M545 Low back pain, unspecified: Secondary | ICD-10-CM

## 2018-07-26 DIAGNOSIS — R252 Cramp and spasm: Secondary | ICD-10-CM | POA: Diagnosis not present

## 2018-07-26 DIAGNOSIS — G8929 Other chronic pain: Secondary | ICD-10-CM | POA: Diagnosis not present

## 2018-07-26 NOTE — Therapy (Signed)
[facility] [address], [place], [place], [address] Phone: [phone]   Fax:  [phone]  Physical Therapy Treatment  Patient Details  Name: Nicole Benson MRN: [id number] Date of Birth: [birth year] Referring Provider (PT): Magnus Sinning MD   Encounter Date: 07/26/2018  PT End of Session - 07/26/18 0920    Visit Number  8   lumbar spine   PT Start Time  3557   short session for dry needling only   PT Stop Time  0918    PT Time Calculation (min)  31 min    Activity Tolerance  Patient tolerated treatment well    Behavior During Therapy  [facility] for tasks assessed/performed       Past Medical History:  Diagnosis Date  . Chronic lower back pain   . Heart murmur     Past Surgical History:  Procedure Laterality Date  . ABLATION     Radio frequency lower back  . COLONOSCOPY    . COLONOSCOPY W/ BIOPSIES     hyperplastic polyp  . KNEE SURGERY Right   . POLYPECTOMY      There were no vitals filed for this visit.  Subjective Assessment - 07/26/18 0850    Subjective  I'm feeling better with my back.  90% overall improvement.  I did good in the car for the long car ride.    Patient Stated Goals       Currently in Pain?  No/denies         [facility] PT Assessment - 07/26/18 0001      Assessment   Medical Diagnosis  low back pain    Referring Provider (PT)  Magnus Sinning MD    Onset Date/Surgical Date  --   several years ago     Prior Function   Level of Independence  Independent    Vocation  Retired    Leisure  mountain bike, read, listen to music and go to concerts, spend time with family      Cognition   Overall Cognitive Status  Within Functional Limits for tasks assessed      Observation/Other Assessments   Focus on Therapeutic Outcomes (FOTO)   17% limitation      Strength   Left Hip ABduction  4+/5                   [facility] Adult PT Treatment/Exercise - 07/26/18 0001      Manual  Therapy   Manual Therapy  Soft tissue mobilization;Myofascial release    Manual therapy comments  soft tissue elongation and trigger point release to bil lumbar parapinals and gluteals       Trigger Point Dry Needling - 07/26/18 0854    Consent Given?  Yes    Education Handout Provided  --   pt declined dry needling to Rt gluteals   Muscles Treated Lower Body  --   Rt gluteals.  Bil lumbar paraspinals   Gluteus Maximus Response  Twitch response elicited;Palpable increased muscle length    Gluteus Minimus Response  Twitch response elicited;Palpable increased muscle length    Piriformis Response  Twitch response elicited;Palpable increased muscle length             PT Short Term Goals - 07/18/18 0935      PT SHORT TERM GOAL #1   Title  be independent in initial HEP for cervical spine and Rt arm    Time  4    Period  Weeks    Status  New    Target Date  08/15/18      PT SHORT TERM GOAL #2   Title  report a 25% reduction in Rt UE radiculopathy at night    Time  4    Period  Weeks    Status  New    Target Date  08/15/18        PT Long Term Goals - 07/26/18 0851      PT LONG TERM GOAL #1   Title  Pt will be independent in her HEP and progression.     Baseline  for low back    Status  Achieved      PT LONG TERM GOAL #2   Title  Pt will be able to tolerate sitting for a full movie without having to stand due to pain.     Baseline  not able to do it still.  Able to sit 40 minutes max at night    Status  Partially Met      PT LONG TERM GOAL #3   Title  Pt will improve her L hip abduction to 5/5 in order to improve functional mobility.     Baseline  4+/5    Status  Partially Met            Plan - 07/26/18 0920    Clinical Impression Statement  Pt reports 90% overall improvement in lumbar symptoms since the start of care.  Pt remains limited to sitting 40 minutes when watching TV or movie.  Pt rode in the car yesterday from [place] and has increased symptoms of  tension in the lumbar spine with dry needling today.  Pt demonstrated improved tissue mobility after dry needling today.  Pt has comprehensive HEP in place for lumbar spine and hip and will continue with this after D/C.      PT Next Visit Plan  D/C PT to HEP for lumbar spine    PT Home Exercise Plan  Access Code: [email or web address]       Patient will benefit from skilled therapeutic intervention in order to improve the following deficits and impairments:     Visit Diagnosis: Chronic bilateral low back pain without sciatica  Cramp and spasm     Problem List Patient Active Problem List   Diagnosis Date Noted  . Physical exam 04/29/2017  . Polyarthralgia 03/29/2017  . Allergic rhinitis 12/10/2016  . Low back pain 12/10/2016  . HIP PAIN, RIGHT 10/18/2009  . SUBSCAPULARIS SPRAIN AND STRAIN 02/21/2009  . 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 Persistent disorder of initiating or maintaining sleep 12/23/2006  . MENISCUS INJURY 12/23/2006  PHYSICAL THERAPY DISCHARGE SUMMARY  Visits from Start of Care: 8  Current functional level related to goals / functional outcomes: Pt attended 8 PT sessions for low back and reports 90% overall improvement in symptoms since the start of care.     Remaining deficits: Limited ability to sit long periods.  Lt hip abduction 4+/5.  Pt has HEP in place for flexibility and strength.     Education / Equipment: HEP, posture/body mechanics Plan: Patient agrees to discharge.  Patient goals were partially met. Patient is being discharged due to being pleased with the current functional level.  ?????        Sigurd Sos, PT 07/26/18 9:21 AM  [facility] [address], [place], [place], [address] Phone: [phone]   Fax:  [phone]  Name: Ranette Luckadoo MRN:  [id number] Date of Birth: Oct 08, 1950

## 2018-07-28 ENCOUNTER — Ambulatory Visit: Payer: PPO

## 2018-07-28 DIAGNOSIS — R293 Abnormal posture: Secondary | ICD-10-CM

## 2018-07-28 DIAGNOSIS — R252 Cramp and spasm: Secondary | ICD-10-CM | POA: Diagnosis not present

## 2018-07-28 DIAGNOSIS — M79601 Pain in right arm: Secondary | ICD-10-CM

## 2018-07-28 DIAGNOSIS — M542 Cervicalgia: Secondary | ICD-10-CM

## 2018-07-28 DIAGNOSIS — M6281 Muscle weakness (generalized): Secondary | ICD-10-CM

## 2018-07-28 NOTE — Patient Instructions (Signed)
Access Code: [email or web address]  URL: [email or web address]  Date: 07/28/2018  Prepared by: Sigurd Sos   Exercises  Supine Shoulder Horizontal Abduction with Resistance - 10 reps - 2 sets - 2x daily - 7x weekly Supine Bilateral Shoulder External Rotation with Resistance - 10 reps - 2 sets - 2x daily - 7x weekly

## 2018-07-28 NOTE — Therapy (Signed)
[facility] [address] [place], [place], [address] Phone: [phone]   Fax:  [phone]  Physical Therapy Treatment  Patient Details  Name: Nicole Benson MRN: [id number] Date of Birth: 11-28-1950 Referring Provider (PT): Magnus Sinning MD   Encounter Date: 07/28/2018  PT End of Session - 07/28/18 1013    Visit Number  2   neck   Date for PT Re-Evaluation  09/12/18    Authorization Type  Medicare    PT Start Time  0933    PT Stop Time  1025    PT Time Calculation (min)  52 min    Activity Tolerance  Patient tolerated treatment well    Behavior During Therapy  [facility] for tasks assessed/performed       Past Medical History:  Diagnosis Date  . Chronic lower back pain   . Heart murmur     Past Surgical History:  Procedure Laterality Date  . ABLATION     Radio frequency lower back  . COLONOSCOPY    . COLONOSCOPY W/ BIOPSIES     hyperplastic polyp  . KNEE SURGERY Right   . POLYPECTOMY      There were no vitals filed for this visit.  Subjective Assessment - 07/28/18 0938    Subjective  The back of my Rt shoulder is still really bothering me.      Currently in Pain?  Yes    Pain Score  6     Pain Location  Arm    Pain Orientation  Right    Pain Descriptors / Indicators  Sore;Tingling    Pain Onset  More than a month ago    Pain Frequency  Constant    Aggravating Factors   using Rt arm, sleep on Lt side, forward position on bike    Pain Relieving Factors  not sure                       [facility] Adult PT Treatment/Exercise - 07/28/18 0001      Exercises   Exercises  Neck;Shoulder      Shoulder Exercises: Supine   Horizontal ABduction  Strengthening;Both;20 reps;Theraband    Theraband Level (Shoulder Horizontal ABduction)  Level 2 (Red)    External Rotation  Strengthening;Both;20 reps;Theraband    Theraband Level (Shoulder External Rotation)  Level 2 (Red)      Modalities    Modalities  Traction      Traction   Type of Traction  Cervical    Min (lbs)  5    Max (lbs)  15    Hold Time  60    Rest Time  10    Time  12      Manual Therapy   Manual Therapy  Soft tissue mobilization;Myofascial release    Manual therapy comments  soft tissue elongation to Rt posterior glenohumeral joint with trigger point release      Neck Exercises: Stretches   Other Neck Stretches  cervical flexibility 3 ways 3x20 seconds       Trigger Point Dry Needling - 07/28/18 0955    Consent Given?  Yes    Muscles Treated Upper Body  Infraspinatus   rt posterior deltoid   Infraspinatus Response  Twitch response elicited;Palpable increased muscle length           PT Education - 07/28/18 0943    Education Details  Access Code: [email or web address]    Person(s) Educated  Patient  Methods  Explanation;Demonstration;Handout    Comprehension  Verbalized understanding;Returned demonstration       PT Short Term Goals - 07/18/18 0935      PT SHORT TERM GOAL #1   Title  be independent in initial HEP for cervical spine and Rt arm    Time  4    Period  Weeks    Status  New    Target Date  08/15/18      PT SHORT TERM GOAL #2   Title  report a 25% reduction in Rt UE radiculopathy at night    Time  4    Period  Weeks    Status  New    Target Date  08/15/18        PT Long Term Goals - 07/26/18 0851      PT LONG TERM GOAL #1   Title  Pt will be independent in her HEP and progression.     Baseline  for low back    Status  Achieved      PT LONG TERM GOAL #2   Title  Pt will be able to tolerate sitting for a full movie without having to stand due to pain.     Baseline  not able to do it still.  Able to sit 40 minutes max at night    Status  Partially Met      PT LONG TERM GOAL #3   Title  Pt will improve her L hip abduction to 5/5 in order to improve functional mobility.     Baseline  4+/5    Status  Partially Met            Plan - 07/28/18 0951    Clinical  Impression Statement  Pt with first time follow-up for neck and Rt shoulder pain.  Pt is independent in cervical flexibility.  Pt requires frequent verbal cues for scapular retraction and to reduce elevation with exercise.  Increased Rt UE symptoms with Rt cervical rotation today.  PT added scapular strength exercises to address postural weakness. Pt with localized trigger points over the posterior shoulder and demonstrated improved tissue mobility and reduced pain after dry needling today.  Pt tolerated cervical traction well today.  Pt will continue to benefit from skilled PT for postural strength, manual and traction to address Rt UE pain.      Rehab Potential  Excellent    PT Frequency  2x / week    PT Duration  6 weeks    PT Treatment/Interventions  Dry needling;Moist Heat;Ultrasound;Balance training;Therapeutic exercise;Therapeutic activities;Functional mobility training;Patient/family education;Manual techniques;Passive range of motion;Taping;Traction;ADLs/Self Care Home Management;Electrical Stimulation;Neuromuscular re-education    PT Next Visit Plan  review HEP, assess response to dry needling and traction    PT Home Exercise Plan   Access Code: [email or web address]    [email or web address] and Agree with Plan of Care  Patient       Patient will benefit from skilled therapeutic intervention in order to improve the following deficits and impairments:  Pain, Decreased strength, Decreased activity tolerance, Postural dysfunction, Decreased mobility, Improper body mechanics, Increased muscle spasms  Visit Diagnosis: Cramp and spasm  Pain in right arm  Cervicalgia  Abnormal posture  Muscle weakness (generalized)     Problem List Patient Active Problem List   Diagnosis Date Noted  . Physical exam 04/29/2017  . Polyarthralgia 03/29/2017  . Allergic rhinitis 12/10/2016  . Low back pain 12/10/2016  . HIP PAIN, RIGHT 10/18/2009  . SUBSCAPULARIS SPRAIN AND STRAIN 02/21/2009  .  CHERRY ANGIOMA 12/23/2006   . Persistent disorder of initiating or maintaining sleep 12/23/2006  . MENISCUS INJURY 12/23/2006    Sigurd Sos, PT 07/28/18 10:15 AM  [facility] [address] [place], [place], [address] Phone: [phone]   Fax:  [phone]  Name: Nicole Benson MRN: [id number] Date of Birth: 09-06-1950

## 2018-08-04 ENCOUNTER — Encounter: Payer: Self-pay | Admitting: Sports Medicine

## 2018-08-04 ENCOUNTER — Ambulatory Visit (INDEPENDENT_AMBULATORY_CARE_PROVIDER_SITE_OTHER): Payer: PPO | Admitting: Sports Medicine

## 2018-08-04 ENCOUNTER — Ambulatory Visit: Payer: PPO

## 2018-08-04 VITALS — BP 140/84 | HR 53 | Ht 65.0 in | Wt 133.4 lb

## 2018-08-04 DIAGNOSIS — R252 Cramp and spasm: Secondary | ICD-10-CM

## 2018-08-04 DIAGNOSIS — M9902 Segmental and somatic dysfunction of thoracic region: Secondary | ICD-10-CM

## 2018-08-04 DIAGNOSIS — M9901 Segmental and somatic dysfunction of cervical region: Secondary | ICD-10-CM

## 2018-08-04 DIAGNOSIS — M79601 Pain in right arm: Secondary | ICD-10-CM

## 2018-08-04 DIAGNOSIS — M9908 Segmental and somatic dysfunction of rib cage: Secondary | ICD-10-CM | POA: Diagnosis not present

## 2018-08-04 DIAGNOSIS — M7711 Lateral epicondylitis, right elbow: Secondary | ICD-10-CM

## 2018-08-04 DIAGNOSIS — M79631 Pain in right forearm: Secondary | ICD-10-CM | POA: Diagnosis not present

## 2018-08-04 DIAGNOSIS — M25511 Pain in right shoulder: Secondary | ICD-10-CM

## 2018-08-04 DIAGNOSIS — G8929 Other chronic pain: Secondary | ICD-10-CM

## 2018-08-04 DIAGNOSIS — M542 Cervicalgia: Secondary | ICD-10-CM

## 2018-08-04 DIAGNOSIS — R293 Abnormal posture: Secondary | ICD-10-CM

## 2018-08-04 DIAGNOSIS — M6281 Muscle weakness (generalized): Secondary | ICD-10-CM

## 2018-08-04 NOTE — Progress Notes (Signed)
Nicole Benson. Nicole Benson, [place] [facility] [place] - 68 y.o. female MRN [id number]  Date of birth: 01-Jul-1950  Visit Date: 08/04/2018  PCP: Midge Minium, MD   Referred by: Midge Minium, MD  SUBJECTIVE:   Chief Complaint  Patient presents with  . Follow-up    R elbow and forearm pain.  Injection on 07/07/18.  Been going to PT.    HPI: Patient is here for follow-up of her right arm and elbow pain.  Pain at her elbow has improved following the injection on 07/07/2018.  She has been working with physical therapy.  She seems to notice that her right deltoid area is exacerbated especially with mountain biking.  Does not seem to bother her as much with the road cycling.  The pain does not radiate past the shoulder at this time.  She still has some tenderness over the right elbow but is markedly improved and more so just with direct palpation.  REVIEW OF SYSTEMS: No significant nighttime awakenings due to this issue. Denies fevers, chills, recent weight gain or weight loss.  No night sweats.  Pt denies any change in bowel or bladder habits, muscle weakness, or falls associated with this pain.  HISTORY:  Prior history reviewed and updated per electronic medical record.  Patient Active Problem List   Diagnosis Date Noted  . Physical exam 04/29/2017  . Polyarthralgia 03/29/2017  . Allergic rhinitis 12/10/2016  . Low back pain 12/10/2016    MRI from July 08, 2015:  L5-S1 disc bulge with superimposed left central posterior lateral caudal disc extrusion which displaces the left S1 nerve root  L4-5 with mild left lateral recess stenosis with possible encroachment on the left L5 root  She is status post percutaneous needle electrical stimulation into the multifidus through trial in [place] that provided moderate to good relief but the probes have had to be removed.  Has consider radiofrequency ablation   .  HIP PAIN, RIGHT 10/18/2009    Qualifier: Diagnosis of  By: Oneida Alar MD, KARL     . SUBSCAPULARIS SPRAIN AND STRAIN 02/21/2009    Qualifier: Diagnosis of  By: Oneida Alar MD, KARL     . CHERRY ANGIOMA 12/23/2006    Qualifier: Diagnosis of  By: Oneida Alar MD, KARL     . Persistent disorder of initiating or maintaining sleep 12/23/2006    Qualifier: Diagnosis of  By: Oneida Alar MD, KARL     . MENISCUS INJURY 12/23/2006    Qualifier: Diagnosis of  By: Oneida Alar MD, KARL      Social History   Occupational History  . Occupation: botanist  Tobacco Use  . Smoking status: Never Smoker  . Smokeless tobacco: Never Used  Substance and Sexual Activity  . Alcohol use: Yes    Alcohol/week: 7.0 standard drinks    Types: 7 Glasses of wine per week    Comment: 4 drinks a week  . Drug use: No  . Sexual activity: Yes    Partners: Male   Social History   Social History Narrative   Married. Education: [employer]. Exercise: Yes.    OBJECTIVE:  VS:  HT:5\' 5"  (165.1 cm)   WT:133 lb 6.4 oz (60.5 kg)  BMI:22.2    BP:140/84  HR:(!) 53bpm  TEMP: ( )  RESP:95 %   PHYSICAL EXAM: Adult female. No acute distress.  Alert and appropriate. Right arm is overall well aligned.  She has marked anterior chain dominance of  the shoulder with a internally rotated and protracted shoulder.  Cervical range of motion is limited.  She has a negative Spurling's compression test and Lhermitte's compression test.  Moderate pain that is over the lateral epicondyles.  Wrist extensor strength is intact.  Grip strength is normal.  Upper extremity strength in all myotomes is symmetric.  She does have scapular dyskinesis.   ASSESSMENT:   1. Somatic dysfunction of cervical region   2. Somatic dysfunction of thoracic region   3. Somatic dysfunction of rib cage region   4. Right forearm pain   5. Chronic right shoulder pain   6. Lateral epicondylitis of right elbow     PROCEDURES:  PROCEDURE NOTE: OSTEOPATHIC MANIPULATION    The decision today to treat with Osteopathic Manipulative Therapy (OMT) was based on physical exam findings. Verbal consent was obtained following a discussion with the patient regarding the of risks, benefits and potential side effects, including an acute pain flare,post manipulation soreness and need for repeat treatments. Additionally, we specifically discussed the minimal risk of  injury to neurovascular structures associated with Cervical manipulation. Contraindications to OMT: NONE Manipulation was performed as below: [facility] Tre[facility] & Osteopathic Exam Findings CERVICAL SPINE: OA - rotated right C4 - 6 Neutral, rotated LEFT, sidebent RIGHT THORACIC SPINE:  T2 - 6 Neutral, rotated LEFT, sidebent RIGHT RIBS:  Rib 3 Right  Posterior  OMT Techniques Used: HVLA muscle energy myofascial release  The patient tolerated the treatment well and reported Improved symptoms following treatment today. Patient was given medications, exercises, stretches and lifestyle modifications per AVS and verbally.      PLAN:  Pertinent additional documentation may be included in corresponding procedure notes, imaging studies, problem based documentation and patient instructions.  No problem-specific Assessment & Plan notes found for this encounter.   Overall her elbow is improved.  I have no doubt that she continues to have some issues with lateral epicondylosis and she should continue with the therapeutic exercises.  Ultimately some of this may be coming from overloading the forearm due to the scapular dyskinesis and overuse from cycling.  Osteopathic manipulation performed today with overall good improvement in her symptoms.  We discussed force coupling and balancing exercises and she should continue with home therapeutic exercises previously prescribed and with physical therapy.  Osteopathic manipulation was performed today based on physical exam findings.  Patient was counseled on the purpose and expected  outcome of osteopathic manipulation and understands that a single treatment may not provide permanent long lasting relief.  They understand that home therapeutic exercises are critical part of the healing/treatment process and will continue with self treatment between now and their next visit as outlined.  The patient understands that the frequency of visits is meant to provide a stimulus to promote the body's own ability to heal and is not meant to be the sole means for improvement in their symptoms.  Activity modifications and the importance of avoiding exacerbating activities (limiting pain to no more than a 4 / 10 during or following activity) recommended and discussed.  Discussed red flag symptoms that warrant earlier emergent evaluation and patient voices understanding.  Return in about 4 weeks (around 09/01/2018) for consideration of repeat Osteopathic Manipulation.          Gerda Diss, [facility] Physician

## 2018-08-04 NOTE — Therapy (Signed)
[facility] [address], [place], [place], [address] Phone: [phone]   Fax:  [phone]  Physical Therapy Treatment  Patient Details  Name: Nicole Benson MRN: [id number] Date of Birth: [birth year] Referring Provider (PT): Teresa Coombs, MD   Encounter Date: 08/04/2018  PT End of Session - 08/04/18 0848    Visit Number  3    Date for PT Re-Evaluation  09/12/18    Authorization Type  Medicare    PT Start Time  0806    PT Stop Time  3295    PT Time Calculation (min)  41 min    Activity Tolerance  Patient tolerated treatment well    Behavior During Therapy  [facility] for tasks assessed/performed       Past Medical History:  Diagnosis Date  . Chronic lower back pain   . Heart murmur     Past Surgical History:  Procedure Laterality Date  . ABLATION     Radio frequency lower back  . COLONOSCOPY    . COLONOSCOPY W/ BIOPSIES     hyperplastic polyp  . KNEE SURGERY Right   . POLYPECTOMY      There were no vitals filed for this visit.  Subjective Assessment - 08/04/18 0808    Subjective  I still feel the tingling in my Rt arm.  I see Dr Paulla Fore today.      Currently in Pain?  Yes    Pain Score  5     Pain Location  Arm    Pain Orientation  Right    Pain Descriptors / Indicators  Tingling;Sore    Pain Onset  More than a month ago    Pain Frequency  Intermittent    Aggravating Factors   riding my bike (mountain bike)    Pain Relieving Factors  change of position         [facility] PT Assessment - 08/04/18 0001      Assessment   Referring Provider (PT)  Teresa Coombs, MD                   [facility] Adult PT Treatment/Exercise - 08/04/18 0001      Neck Exercises: Machines for Strengthening   UBE (Upper Arm Bike)  Level 1x 5 in reverse.     PT present to discuss progress     Shoulder Exercises: Supine   Horizontal ABduction  Strengthening;Both;20 reps;Theraband    Theraband Level (Shoulder  Horizontal ABduction)  Level 2 (Red)   on foam roll   External Rotation  Strengthening;Both;20 reps;Theraband    Theraband Level (Shoulder External Rotation)  Level 2 (Red)   on foam roll   Other Supine Exercises  supine on foam roll for decompression      Manual Therapy   Manual Therapy  Soft tissue mobilization;Myofascial release    Manual therapy comments  manual traction, stretch to upper trap on Rt, PA and rotational mobs grade 2-3 C3-6               PT Short Term Goals - 08/04/18 1884      PT SHORT TERM GOAL #1   Title  be independent in initial HEP for cervical spine and Rt arm    Time  4    Period  Weeks    Status  On-going      PT SHORT TERM GOAL #2   Title  report a 25% reduction in Rt UE radiculopathy at night  Time  4    Period  Weeks    Status  On-going        PT Long Term Goals - 07/26/18 0851      PT LONG TERM GOAL #1   Title  Pt will be independent in her HEP and progression.     Baseline  for low back    Status  Achieved      PT LONG TERM GOAL #2   Title  Pt will be able to tolerate sitting for a full movie without having to stand due to pain.     Baseline  not able to do it still.  Able to sit 40 minutes max at night    Status  Partially Met      PT LONG TERM GOAL #3   Title  Pt will improve her L hip abduction to 5/5 in order to improve functional mobility.     Baseline  4+/5    Status  Partially Met            Plan - 08/04/18 0850    Clinical Impression Statement  Pt reports no significant change in Rt UE symptoms since the start of care. Pt does report that pain is now staying proximal to the elbow.  Pt is focusing on postural alignment and strength with exercise at home.  Pt demonstrated good stability on the foam roll with exercise today.  Pt with reduced cervical mobility with mobs today without pain.  Tension in bil upper traps. Pt with continued tenderness in Rt posterior deltoid.  Pt will see MD today to discuss progress.       Rehab Potential  Excellent    PT Frequency  2x / week    PT Duration  6 weeks    PT Treatment/Interventions  Dry needling;Moist Heat;Ultrasound;Balance training;Therapeutic exercise;Therapeutic activities;Functional mobility training;Patient/family education;Manual techniques;Passive range of motion;Taping;Traction;ADLs/Self Care Home Management;Electrical Stimulation;Neuromuscular re-education    PT Next Visit Plan  see what MD says.  Cervical traction, manual, postural strength    PT Home Exercise Plan   Access Code: [email or web address]    Consulted and Agree with Plan of Care  Patient       Patient will benefit from skilled therapeutic intervention in order to improve the following deficits and impairments:  Pain, Decreased strength, Decreased activity tolerance, Postural dysfunction, Decreased mobility, Improper body mechanics, Increased muscle spasms  Visit Diagnosis: Cramp and spasm  Pain in right arm  Cervicalgia  Abnormal posture  Muscle weakness (generalized)     Problem List Patient Active Problem List   Diagnosis Date Noted  . Physical exam 04/29/2017  . Polyarthralgia 03/29/2017  . Allergic rhinitis 12/10/2016  . Low back pain 12/10/2016  . HIP PAIN, RIGHT 10/18/2009  . SUBSCAPULARIS SPRAIN AND STRAIN 02/21/2009  . 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 Persistent disorder of initiating or maintaining sleep 12/23/2006  . MENISCUS INJURY 12/23/2006    Sigurd Sos, PT 08/04/18 8:59 AM  [facility] [address], [place], [place], [address] Phone: [phone]   Fax:  [phone]  Name: Nicole Benson MRN: [id number] Date of Birth: 1950/10/03

## 2018-08-11 ENCOUNTER — Ambulatory Visit: Payer: PPO

## 2018-08-11 DIAGNOSIS — R252 Cramp and spasm: Secondary | ICD-10-CM | POA: Diagnosis not present

## 2018-08-11 DIAGNOSIS — M6281 Muscle weakness (generalized): Secondary | ICD-10-CM

## 2018-08-11 DIAGNOSIS — M542 Cervicalgia: Secondary | ICD-10-CM

## 2018-08-11 DIAGNOSIS — R293 Abnormal posture: Secondary | ICD-10-CM

## 2018-08-11 DIAGNOSIS — M79601 Pain in right arm: Secondary | ICD-10-CM

## 2018-08-11 NOTE — Patient Instructions (Signed)
Access Code: [email or web address]  URL: [email or web address]  Date: 08/11/2018  Prepared by: Sigurd Sos   Exercises  Standing Shoulder Flexion with Posterior Anchored Resistance - 10 reps - 2 sets - 2x daily - 7x weekly Single Arm Shoulder Extension with Anchored Resistance - 10 reps - 2 sets - 2x daily - 7x weekly Shoulder Internal Rotation with Resistance - 10 reps - 2 sets - 2x daily - 7x weekly Shoulder External Rotation with Anchored Resistance - 10 reps - 2 sets - 2x daily - 7x weekly

## 2018-08-11 NOTE — Therapy (Signed)
[facility] [address], [place], [place], [address] Phone: [phone]   Fax:  [phone]  Physical Therapy Treatment  Patient Details  Name: Nicole Benson MRN: [id number] Date of Birth: 12/31/1950 Referring Provider (PT): Teresa Coombs, MD   Encounter Date: 08/11/2018  PT End of Session - 08/11/18 1016    Visit Number  4    Date for PT Re-Evaluation  09/12/18    Authorization Type  Medicare    PT Start Time  0930    PT Stop Time  1016    PT Time Calculation (min)  46 min    Activity Tolerance  Patient tolerated treatment well    Behavior During Therapy  [facility] for tasks assessed/performed       Past Medical History:  Diagnosis Date  . Chronic lower back pain   . Heart murmur     Past Surgical History:  Procedure Laterality Date  . ABLATION     Radio frequency lower back  . COLONOSCOPY    . COLONOSCOPY W/ BIOPSIES     hyperplastic polyp  . KNEE SURGERY Right   . POLYPECTOMY      There were no vitals filed for this visit.  Subjective Assessment - 08/11/18 3536    Subjective   I saw the MD and had manipulation there.  My Rt shoulder is bothering me at the deltoid area.      Currently in Pain?  Yes    Pain Score  6     Pain Location  Arm    Pain Orientation  Right    Pain Descriptors / Indicators  Tingling;Sore    Pain Onset  More than a month ago    Pain Frequency  Intermittent    Aggravating Factors   riding bike, use of Rt arm    Pain Relieving Factors  change of position, wearing strap around Rt deltoid                       [facility] Adult PT Treatment/Exercise - 08/11/18 0001      Neck Exercises: Machines for Strengthening   UBE (Upper Arm Bike)  Level 1x 5 in reverse.     PT present to discuss progress     Shoulder Exercises: Supine   Horizontal ABduction  Strengthening;Both;20 reps;Theraband    Theraband Level (Shoulder Horizontal ABduction)  Level 2 (Red)   on foam roll    External Rotation  Strengthening;Both;20 reps;Theraband    Theraband Level (Shoulder External Rotation)  Level 2 (Red)   on foam roll     Shoulder Exercises: Standing   External Rotation  Strengthening;Right;20 reps;Theraband    Theraband Level (Shoulder External Rotation)  Level 2 (Red)    Internal Rotation  Strengthening;Both;Theraband;20 reps    Theraband Level (Shoulder Internal Rotation)  Level 2 (Red)    Flexion  Strengthening;Both;20 reps;Theraband    Theraband Level (Shoulder Flexion)  Level 2 (Red)    Extension  Strengthening;Both;20 reps;Theraband    Theraband Level (Shoulder Extension)  Level 2 (Red)      Manual Therapy   Manual Therapy  Soft tissue mobilization;Myofascial release    Manual therapy comments  Rt shoulder anterior/posterior/lateral       Trigger Point Dry Needling - 08/11/18 0942    Consent Given?  Yes    Muscles Treated Upper Body  --   Rt only with deltoid on Rt   Supraspinatus Response  Twitch response elicited;Palpable increased muscle  length    Infraspinatus Response  Palpable increased muscle length;Twitch response elicited           PT Education - 08/11/18 0953    Education Details  Access Code: [email or web address]  (Pended)     Person(s) Educated  Patient  (Pended)     Methods  Explanation;Demonstration;Handout  (Pended)     Comprehension  Verbalized understanding;Returned demonstration  (Pended)        PT Short Term Goals - 08/11/18 0939      PT SHORT TERM GOAL #1   Title  be independent in initial HEP for cervical spine and Rt arm    Status  Achieved      PT SHORT TERM GOAL #2   Title  report a 25% reduction in Rt UE radiculopathy at night    Time  4    Period  Weeks    Status  On-going        PT Long Term Goals - 07/26/18 5885      PT LONG TERM GOAL #1   Title  Pt will be independent in her HEP and progression.     Baseline  for low back    Status  Achieved      PT LONG TERM GOAL #2   Title  Pt will be able to tolerate  sitting for a full movie without having to stand due to pain.     Baseline  not able to do it still.  Able to sit 40 minutes max at night    Status  Partially Met      PT LONG TERM GOAL #3   Title  Pt will improve her L hip abduction to 5/5 in order to improve functional mobility.     Baseline  4+/5    Status  Partially Met            Plan - 08/11/18 0950    Clinical Impression Statement  Pt continues to work on scapular strength and PT added rotator cuff strength exercises today.  Pt required tactile and demo cues to reduce scapular substitution with rotator cuff theraband exercises. Pt with tension and trigger point in Rt deltoid and demonstrated improved tissue mobility and reduced pain after dry needling and manual therapy today.  Pt will continue to benefit from skilled PT to address Rt shoulder pain.      Rehab Potential  Excellent    PT Frequency  2x / week    PT Duration  6 weeks    PT Treatment/Interventions  Dry needling;Moist Heat;Ultrasound;Balance training;Therapeutic exercise;Therapeutic activities;Functional mobility training;Patient/family education;Manual techniques;Passive range of motion;Taping;Traction;ADLs/Self Care Home Management;Electrical Stimulation;Neuromuscular re-education    PT Next Visit Plan  assess response to dry needling, review rotator cuff strength, cervical traction    PT Home Exercise Plan   Access Code: [email or web address]    Consulted and Agree with Plan of Care  Patient       Patient will benefit from skilled therapeutic intervention in order to improve the following deficits and impairments:  Pain, Decreased strength, Decreased activity tolerance, Postural dysfunction, Decreased mobility, Improper body mechanics, Increased muscle spasms  Visit Diagnosis: Cramp and spasm  Pain in right arm  Cervicalgia  Abnormal posture  Muscle weakness (generalized)     Problem List Patient Active Problem List   Diagnosis Date Noted  . Physical exam  04/29/2017  . Polyarthralgia 03/29/2017  . Allergic rhinitis 12/10/2016  . Low back pain 12/10/2016  . HIP PAIN, RIGHT 10/18/2009  . SUBSCAPULARIS  SPRAIN AND STRAIN 02/21/2009  . 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 Persistent disorder of initiating or maintaining sleep 12/23/2006  . MENISCUS INJURY 12/23/2006    Sigurd Sos, PT 08/11/18 10:18 AM  [facility] [address], [place], [place], [address] Phone: [phone]   Fax:  [phone]  Name: Nicole Benson MRN: [id number] Date of Birth: Nov 28, 1950

## 2018-08-18 ENCOUNTER — Ambulatory Visit: Payer: PPO | Attending: Sports Medicine

## 2018-08-18 DIAGNOSIS — R293 Abnormal posture: Secondary | ICD-10-CM

## 2018-08-18 DIAGNOSIS — M79601 Pain in right arm: Secondary | ICD-10-CM | POA: Diagnosis not present

## 2018-08-18 DIAGNOSIS — R252 Cramp and spasm: Secondary | ICD-10-CM | POA: Insufficient documentation

## 2018-08-18 DIAGNOSIS — M542 Cervicalgia: Secondary | ICD-10-CM | POA: Insufficient documentation

## 2018-08-18 NOTE — Therapy (Addendum)
[facility] [address], [place], [place], [address] Phone: [phone]   Fax:  [phone]  Physical Therapy Treatment  Patient Details  Name: Nicole Benson MRN: [id number] Date of Birth: [birth year] Referring Provider (PT): Teresa Coombs, MD   Encounter Date: 08/18/2018  PT End of Session - 08/18/18 1008    Visit Number  5    Date for PT Re-Evaluation  09/12/18    Authorization Type  Medicare    PT Start Time  0930    PT Stop Time  1019    PT Time Calculation (min)  49 min    Activity Tolerance  Patient tolerated treatment well    Behavior During Therapy  [facility] for tasks assessed/performed       Past Medical History:  Diagnosis Date  . Chronic lower back pain   . Heart murmur     Past Surgical History:  Procedure Laterality Date  . ABLATION     Radio frequency lower back  . COLONOSCOPY    . COLONOSCOPY W/ BIOPSIES     hyperplastic polyp  . KNEE SURGERY Right   . POLYPECTOMY      There were no vitals filed for this visit.  Subjective Assessment - 08/18/18 0934    Subjective  This nerve pain is persistent.      Currently in Pain?  Yes    Pain Score  5     Pain Location  Arm    Pain Orientation  Right    Pain Descriptors / Indicators  Tingling;Sore    Pain Onset  More than a month ago    Pain Frequency  Intermittent    Aggravating Factors   sleep at night, riding bike, use of Rt arm    Pain Relieving Factors  change of position, wearing strap on arm                       [facility] Adult PT Treatment/Exercise - 08/18/18 0001      Neck Exercises: Machines for Strengthening   UBE (Upper Arm Bike)  Level 1x 5 in reverse.     PT present to discuss progress     Shoulder Exercises: Seated   Horizontal ABduction  Strengthening;Both;20 reps;Theraband    Theraband Level (Shoulder Horizontal ABduction)  Level 2 (Red)    External Rotation  Strengthening;Both;20 reps;Theraband    Theraband  Level (Shoulder External Rotation)  Level 2 (Red)    Diagonals  Strengthening;Both;Theraband;20 reps    Theraband Level (Shoulder Diagonals)  Level 2 (Red)    Other Seated Exercises  1# 3 ways 2x10      Traction   Type of Traction  Cervical    Min (lbs)  5    Max (lbs)  15    Hold Time  60    Rest Time  10    Time  12             PT Education - 08/18/18 0959    Education Details  Access Code: [email or web address]    Person(s) Educated  Patient    Methods  Explanation;Demonstration;Handout    Comprehension  Verbalized understanding;Returned demonstration       PT Short Term Goals - 08/18/18 0940      PT SHORT TERM GOAL #2   Title  report a 25% reduction in Rt UE radiculopathy at night    Time  4    Period  Weeks    Status  On-going        PT Long Term Goals - 08/18/18 0941      PT LONG TERM GOAL #1   Title  Pt will be independent in her HEP and progression.     Baseline  --    Time  8    Period  Weeks    Status  On-going      PT LONG TERM GOAL #4   Title  report a 60% reduction in Rt UE radiculopathy at night     Time  8    Period  Weeks    Status  On-going            Plan - 08/18/18 0944    Clinical Impression Statement  Pt continues to experience Rt UE radiculopathy that is intermittent.  Pain is 5/10 with sleep at night, use of arm and with riding bike.  Pt reports 70% improvement in symptoms during the day and no change in symptoms at night.  Pt with trigger points in Rt deltoid and biceps.  Pt is independent in HEP for rotator cuff strength and postural strength.  Pt tolerated traction well today.  Pt will continue to benefit from skilled PT for postural strength, traction and manual to address Rt UE pain.      Rehab Potential  Excellent    PT Frequency  2x / week    PT Duration  6 weeks    PT Treatment/Interventions  Dry needling;Moist Heat;Ultrasound;Balance training;Therapeutic exercise;Therapeutic activities;Functional mobility training;Patient/family  education;Manual techniques;Passive range of motion;Taping;Traction;ADLs/Self Care Home Management;Electrical Stimulation;Neuromuscular re-education    PT Next Visit Plan  traction, postural strength, manual therapy    PT Home Exercise Plan   Access Code: [email or web address]    Consulted and Agree with Plan of Care  Patient       Patient will benefit from skilled therapeutic intervention in order to improve the following deficits and impairments:  Pain, Decreased strength, Decreased activity tolerance, Postural dysfunction, Decreased mobility, Improper body mechanics, Increased muscle spasms  Visit Diagnosis: Cramp and spasm  Pain in right arm  Cervicalgia  Abnormal posture     Problem List Patient Active Problem List   Diagnosis Date Noted  . Physical exam 04/29/2017  . Polyarthralgia 03/29/2017  . Allergic rhinitis 12/10/2016  . Low back pain 12/10/2016  . HIP PAIN, RIGHT 10/18/2009  . SUBSCAPULARIS SPRAIN AND STRAIN 02/21/2009  . 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 Persistent disorder of initiating or maintaining sleep 12/23/2006  . MENISCUS INJURY 12/23/2006    Sigurd Sos, PT 08/18/18 10:10 AM PHYSICAL THERAPY DISCHARGE SUMMARY  Visits from Start of Care: 5  Current functional level related to goals / functional outcomes: Pt requested to be placed on hold until MD appointment.  Pt had injection at this visit and didn't schedule further PT.  Pt has comprehensive HEP in place for her neck and shoulders to address pain and strength.     Remaining deficits: See above for current PT status.     Education / Equipment: HEP, Biomedical scientist Plan: Patient agrees to discharge.  Patient goals were partially met. Patient is being discharged due to the patient's request.  ?????        Sigurd Sos, PT 09/06/18 4:11 PM   [facility] [address] [place], [place], [address] Phone: [phone]   Fax:   [phone]  Name: Nicole Benson MRN: [id number] Date of Birth: [birth year]

## 2018-08-18 NOTE — Patient Instructions (Signed)
Access Code: [email or web address]  URL: [email or web address]  Date: 08/18/2018  Prepared by: Sigurd Sos   Exercises  Standing Shoulder Single Arm PNF D2 Flexion with Resistance - 10 reps - 2 sets - 1x daily - 7x weekly Seated Shoulder Abduction - Palms Down - 10 reps - 2 sets - 2x daily - 7x weekly Seated Shoulder Flexion - 10 reps - 2 sets - 2x daily - 7x weekly Seated Shoulder Scaption - 10 reps - 2 sets - 2x daily - 7x weekly

## 2018-08-25 ENCOUNTER — Encounter: Payer: Self-pay | Admitting: Sports Medicine

## 2018-08-25 ENCOUNTER — Ambulatory Visit (INDEPENDENT_AMBULATORY_CARE_PROVIDER_SITE_OTHER): Payer: PPO | Admitting: Sports Medicine

## 2018-08-25 ENCOUNTER — Other Ambulatory Visit: Payer: Self-pay

## 2018-08-25 ENCOUNTER — Ambulatory Visit: Payer: Self-pay

## 2018-08-25 ENCOUNTER — Ambulatory Visit: Payer: PPO

## 2018-08-25 VITALS — BP 110/70 | HR 54 | Ht 65.0 in | Wt 133.8 lb

## 2018-08-25 DIAGNOSIS — M75101 Unspecified rotator cuff tear or rupture of right shoulder, not specified as traumatic: Secondary | ICD-10-CM | POA: Diagnosis not present

## 2018-08-25 DIAGNOSIS — G2589 Other specified extrapyramidal and movement disorders: Secondary | ICD-10-CM | POA: Diagnosis not present

## 2018-08-25 DIAGNOSIS — G8929 Other chronic pain: Secondary | ICD-10-CM | POA: Diagnosis not present

## 2018-08-25 DIAGNOSIS — M79631 Pain in right forearm: Secondary | ICD-10-CM

## 2018-08-25 DIAGNOSIS — M7551 Bursitis of right shoulder: Secondary | ICD-10-CM

## 2018-08-25 DIAGNOSIS — M25511 Pain in right shoulder: Secondary | ICD-10-CM

## 2018-08-25 MED ORDER — NITROGLYCERIN 0.2 MG/HR TD PT24: MEDICATED_PATCH | TRANSDERMAL | 1 refills | Status: DC

## 2018-08-25 NOTE — Patient Instructions (Addendum)
You had an injection today.  Things to be aware of after injection are listed below: . You may experience no significant improvement or even a slight worsening in your symptoms during the first 24 to 48 hours.  After that we expect your symptoms to improve gradually over the next 2 weeks for the medicine to have its maximal effect.  You should continue to have improvement out to 6 weeks after your injection. . Dr. Paulla Fore recommends icing the site of the injection for 20 minutes  1-2 times the day of your injection . You may shower but no swimming, tub bath or Jacuzzi for 24 hours. . If your bandage falls off this does not need to be replaced.  It is appropriate to remove the bandage after 4 hours. . You may resume light activities as tolerated unless otherwise directed per Dr. Paulla Fore during your visit  POSSIBLE STEROID SIDE EFFECTS:  Side effects from injectable steroids tend to be less than when taken orally however you may experience some of the symptoms listed below.  If experienced these should only last for a short period of time. Change in menstrual flow  Edema (swelling)  Increased appetite Skin flushing (redness)  Skin rash/acne  Thrush (oral) Yeast vaginitis    Increased sweating  Depression Increased blood glucose levels Cramping and leg/calf  Euphoria (feeling happy)  POSSIBLE PROCEDURE SIDE EFFECTS: The side effects of the injection are usually fairly minimal however if you may experience some of the following side effects that are usually self-limited and will is off on their own.  If you are concerned please feel free to call the office with questions:  Increased numbness or tingling  Nausea or vomiting  Swelling or bruising at the injection site   Please call our office if if you experience any of the following symptoms over the next week as these can be signs of infection:   Fever greater than 100.73F  Significant swelling at the injection site  Significant redness or drainage  from the injection site  If after 2 weeks you are continuing to have worsening symptoms please call our office to discuss what the next appropriate actions should be including the potential for a return office visit or other diagnostic testing.   Nitroglycerin Protocol   Apply 1/4 nitroglycerin patch to affected area daily.  Change position of patch within the affected area every 24 hours.  You may experience a headache during the first 1-2 weeks of using the patch, these should subside.  If you experience headaches after beginning nitroglycerin patch treatment, you may take your preferred over the counter pain reliever.  Another side effect of the nitroglycerin patch is skin irritation or rash related to patch adhesive.  Please notify our office if you develop more severe headaches or rash, and stop the patch.  Tendon healing with nitroglycerin patch may require 12 to 24 weeks depending on the extent of injury.  Men should not use if taking Viagra, Cialis, or Levitra.   Do not use if you have migraines or rosacea.

## 2018-08-25 NOTE — Progress Notes (Signed)
Nicole Benson. [place], [place] [facility] [place] - 67 y.o. female MRN [id number]  Date of birth: [birth year]  Visit Date: 08/25/2018  PCP: Midge Minium, MD   Referred by: Midge Minium, MD  SUBJECTIVE:  Chief Complaint  Patient presents with  . Right Arm - Follow-up  . Follow-up    R elbow and R arm pain.  OMT.  R elbow injection - 07/07/18.  Been going to PT.    HPI: Patient is here for follow-up of her right arm and elbow pain.  She is continued to have some burning and nerve type pain reports really only minimal improvements at this time.  She is continued to have numbness.  Pain does occasionally radiate up into the right upper arm on the lateral aspect of her deltoid.  Palpation directly over the lateral condyle and common extensor tendon seem to worsen this.  Mountain biking is also causing increased symptoms.  She is taking meloxicam and Tylenol intermittently.  She is status post right elbow injection on 07/07/2018  REVIEW OF SYSTEMS: Denies fevers, chills, recent weight gain or weight loss.  No night sweats.  Pt denies any change in bowel or bladder habits, muscle weakness, numbness or falls associated with this pain.  HISTORY:  Prior history reviewed and updated per electronic medical record.  Patient Active Problem List   Diagnosis Date Noted  . Physical exam 04/29/2017  . Polyarthralgia 03/29/2017  . Allergic rhinitis 12/10/2016  . Low back pain 12/10/2016    MRI from July 08, 2015:  L5-S1 disc bulge with superimposed left central posterior lateral caudal disc extrusion which displaces the left S1 nerve root  L4-5 with mild left lateral recess stenosis with possible encroachment on the left L5 root  She is status post percutaneous needle electrical stimulation into the multifidus through trial in [place] that provided moderate to good relief but the probes have had to be removed.   Has consider radiofrequency ablation   . HIP PAIN, RIGHT 10/18/2009    Qualifier: Diagnosis of  By: Oneida Alar MD, KARL     . SUBSCAPULARIS SPRAIN AND STRAIN 02/21/2009    Qualifier: Diagnosis of  By: Oneida Alar MD, KARL     . CHERRY ANGIOMA 12/23/2006    Qualifier: Diagnosis of  By: Oneida Alar MD, KARL     . Persistent disorder of initiating or maintaining sleep 12/23/2006    Qualifier: Diagnosis of  By: Oneida Alar MD, KARL     . MENISCUS INJURY 12/23/2006    Qualifier: Diagnosis of  By: Oneida Alar MD, KARL      Social History   Occupational History  . Occupation: botanist  Tobacco Use  . Smoking status: Never Smoker  . Smokeless tobacco: Never Used  Substance and Sexual Activity  . Alcohol use: Yes    Alcohol/week: 7.0 standard drinks    Types: 7 Glasses of wine per week    Comment: 4 drinks a week  . Drug use: No  . Sexual activity: Yes    Partners: Male   Social History   Social History Narrative   Married. Education: [employer]. Exercise: Yes.    OBJECTIVE:  VS:  HT:5\' 5"  (165.1 cm)   WT:133 lb 12.8 oz (60.7 kg)  BMI:22.27    BP:110/70  HR:(!) 54bpm  TEMP: ( )  RESP:96 %   PHYSICAL EXAM: Adult female. No acute distress.  Alert and appropriate. Right upper extremity is overall well aligned  without significant deformity.  She has good cervical range of motion.  There is pain over the common extensor tendons and the lateral condyle.  She has good elbow flexion and extension.  Right shoulder is overall in alignment.  She has full overhead range of motion.  Slightly positive Hawkins but this is minimal.  She has good internal and external rotation strength and range of motion.  Slight pain with axial load and circumduction.     ASSESSMENT:   1. Chronic right shoulder pain   2. Right forearm pain   3. Rotator cuff syndrome of right shoulder   4. Subacromial bursitis of right shoulder joint   5. Scapular dyskinesis     PROCEDURES:  US Guided Injection per procedure  note     PLAN:  Pertinent additional documentation may be included in corresponding procedure notes, imaging studies, problem based documentation and patient instructions.  No problem-specific Assessment & Plan notes found for this encounter.   Ultimately she has some subacromial bursitis seen today on ultrasound.  This was injected.  Complicating the bursitis is some underlying rotator cuff tendinopathy and she will benefit from beginning on nitroglycerin protocol in 2 weeks.  Home Therapeutic Exercises: Continue previously prescribed home exercise program Formal Physical Therapy: Continue working with physical therapy  Activity modifications and the importance of avoiding exacerbating activities (limiting pain to no more than a 4 / 10 during or following activity) recommended and discussed.   Discussed red flag symptoms that warrant earlier emergent evaluation and patient voices understanding.    Meds ordered this encounter  Medications  . nitroGLYCERIN (NITRODUR - DOSED IN MG/24 HR) 0.2 mg/hr patch    Sig: Place 1/4 to 1/2 of a patch over affected region. Remove and replace once daily.  Slightly alter skin placement daily    Dispense:  30 patch    Refill:  1    For musculoskeletal purposes.  Okay to cut patch.   Lab Orders  No laboratory test(s) ordered today    Imaging Orders     [place] [facility] POCT ULTRASOUND Referral Orders  No referral(s) requested today    Return in about 6 weeks (around 10/06/2018).          Gerda Diss, [place] [facility] Physician

## 2018-09-01 ENCOUNTER — Ambulatory Visit: Payer: PPO | Admitting: Sports Medicine

## 2018-09-01 NOTE — Procedures (Signed)
PROCEDURE NOTE:  Ultrasound Guided: Injection: Right shoulder Images were obtained and interpreted by myself, Teresa Coombs, DO  Images have been saved and stored to PACS system. Images obtained on: GE S7 Ultrasound machine    ULTRASOUND FINDINGS:  Biceps Tendon: Hypoechoic change with positive halo sign but tendon is intact. Pec Major Insertion: Normal Subscapularis Tendon: Normal Supraspinatus Tendon: Moderate thickening with interstitial splitting without overt full-thickness tear. Infraspinatus/Teres Minor Tendon: Normal AC Joint: Degenerative changes of the AC joint. JOINT: No significant GH spurring appreciated LABRUM: Not evaluated   DESCRIPTION OF PROCEDURE:  The patient's clinical condition is marked by substantial pain and/or significant functional disability. Other conservative therapy has not provided relief, is contraindicated, or not appropriate. There is a reasonable likelihood that injection will significantly improve the patient's pain and/or functional impairment.   After discussing the risks, benefits and expected outcomes of the injection and all questions were reviewed and answered, the patient wished to undergo the above named procedure.  Verbal consent was obtained.  The ultrasound was used to identify the target structure and adjacent neurovascular structures. The skin was then prepped in sterile fashion and the target structure was injected under direct visualization using sterile technique as below:  Single injection performed as below: PREP: Alcohol and Ethel Chloride APPROACH:lateral, single injection, 25g 1.5 in. INJECTATE: 2 cc 0.5% Marcaine and 2 cc 40mg /mL DepoMedrol ASPIRATE: None DRESSING: Band-Aid  Post procedural instructions including recommending icing and warning signs for infection were reviewed.    This procedure was well tolerated and there were no complications.   IMPRESSION: Succesful Ultrasound Guided: Injection

## 2018-10-04 ENCOUNTER — Ambulatory Visit: Payer: PPO | Admitting: Sports Medicine

## 2018-10-27 DIAGNOSIS — H04123 Dry eye syndrome of bilateral lacrimal glands: Secondary | ICD-10-CM | POA: Diagnosis not present

## 2018-10-27 DIAGNOSIS — H40013 Open angle with borderline findings, low risk, bilateral: Secondary | ICD-10-CM | POA: Diagnosis not present

## 2018-10-27 DIAGNOSIS — H25819 Combined forms of age-related cataract, unspecified eye: Secondary | ICD-10-CM | POA: Diagnosis not present

## 2018-10-27 DIAGNOSIS — H52223 Regular astigmatism, bilateral: Secondary | ICD-10-CM | POA: Diagnosis not present

## 2018-10-27 DIAGNOSIS — H5213 Myopia, bilateral: Secondary | ICD-10-CM | POA: Diagnosis not present

## 2018-10-31 ENCOUNTER — Ambulatory Visit: Payer: PPO | Admitting: Family Medicine

## 2018-11-22 ENCOUNTER — Ambulatory Visit (INDEPENDENT_AMBULATORY_CARE_PROVIDER_SITE_OTHER): Payer: PPO | Admitting: Sports Medicine

## 2018-11-22 ENCOUNTER — Encounter: Payer: Self-pay | Admitting: Sports Medicine

## 2018-11-22 ENCOUNTER — Other Ambulatory Visit: Payer: Self-pay

## 2018-11-22 VITALS — BP 122/70 | Ht 65.0 in | Wt 128.0 lb

## 2018-11-22 DIAGNOSIS — M545 Low back pain, unspecified: Secondary | ICD-10-CM

## 2018-11-22 DIAGNOSIS — M2170 Unequal limb length (acquired), unspecified site: Secondary | ICD-10-CM | POA: Diagnosis not present

## 2018-11-22 DIAGNOSIS — G8929 Other chronic pain: Secondary | ICD-10-CM

## 2018-11-22 NOTE — Assessment & Plan Note (Signed)
-  1 cm shorter on the right side versus the left side -3/16 lift placed in the right shoe -Temporary orthotics size 8-1/2 given today as a trial -May schedule follow-up appointment for custom orthotics if she desires -Gait analysis performed today with evidence of compensation with increased right arm swing and out toeing of her right foot.  Repeat gait analysis upon follow-up

## 2018-11-22 NOTE — Progress Notes (Signed)
Shay Jhaveri - 68 y.o. female MRN [id number]  Date of birth: June 01, 1951   Chief complaint: Low back pain, question on orthotics  SUBJECTIVE:    History of present illness: 68 year old female who presents today with ongoing chronic low back pain who is wondering whether or not she should get a pair of custom orthotics.  She has a longstanding history of low back pain with a recent MRI from 2017 showing a disc bulge at L5-S1 with compression of the S1 nerve root as well as L4-5 stenosis with compression of the left L5 nerve root.  She states today, her back pain is mild in nature.  She rates it 3 out of 10 worse with walking on uneven ground or down surfaces.  She [place] however this has been difficult lately secondary to pain.  She denies any radiating symptoms into her legs.  Her low back pain is focal in the lumbar region.  It is also worse with turning from side to side.  Denies any loss of bowel or bladder function.  She is an avid runner and in the past she had custom orthotics which helped her out a lot.  She denies any foot pain or knee pain.  She has been told in the past that she has a leg length discrepancy.   Review of systems:  Per HPI; in addition no fever, no rash, no additional weakness, no additional numbness, no additional paresthesias, and no additional fall/injury   Interval past medical history, surgical history, family history, and social history obtained and are unchanged.   Of note she has a history of chronic low back pain.  Medications reviewed.  Of note, she does take nitroglycerin patches for rotator cuff tendinopathy, acetaminophen, and meloxicam for chronic low back pain. Allergies reviewed.  She has a sulfa allergy.  OBJECTIVE:  Physical exam: Vital signs are reviewed. BP 122/70   Ht 5\' 5"  (1.651 m)   Wt 128 lb (58.1 kg)   BMI 21.30 kg/m   Gen.: Alert, oriented, appears stated age, in no apparent distress  Integumentary: No rashes  Back: No midline tenderness to palpation.  Mild restriction in rotation to the left secondary to pain.  Reactive paraspinal muscle change on the right side of her low back.  Negative straight leg raise bilaterally. Neurologic:  Sensation is intact light touch L4-S1 bilaterally, no saddle anesthesia Gait: Gait analysis performed today with evidence of right foot out toeing and increased right arm swing as a natural compensation mechanism for her leg length discrepancy. Psych: Normal affect, mood is described as good Musculoskeletal: Inspection of her right leg demonstrates a obvious leg length discrepancy.  Her right leg was 1 cm shorter than her left leg upon measurements.  No tenderness to palpation of her bilateral lower extremities.  Full pain-free range of motion of her extremities.  Hip abduction strength is 5 out of 5 bilaterally.  Negative logroll testing.  Negative Faber testing.    ASSESSMENT & PLAN: Leg length discrepancy -1 cm shorter on the right side versus the left side -3/16 lift placed in the right shoe -Temporary orthotics size 8-1/2 given today as a trial -May schedule follow-up appointment for custom orthotics if she desires -Gait analysis performed today with evidence of compensation with increased right arm swing and out toeing of her right foot.  Repeat gait analysis upon follow-up  Low back pain -Reviewed MRI from 2017 demonstrating a disc bulge at L5-S1 with  compression of the left S1 nerve root as well as L4-L5 lateral recess stenosis with possible compression of left L5 root -No current radicular symptoms today -Discussed the importance of adequate lifting technique, core strengthening exercises, and a trial of temporary orthotics -may use meloxicam 15mg  for 2-4 weeks when back pain worsens, counseled on s/e of medication -recommend against use of nitro patches for back pain as this has not been shown to be effective for this issue -patient  to purchase new pair of shoes as current pair have too much forefoot wear  Clydene Laming, [place]  I observed and examined the patient with the Dr. Lunette Stands and agree with assessment and plan.  Note reviewed and modified by me. [name], MD

## 2018-11-22 NOTE — Assessment & Plan Note (Signed)
-  Reviewed MRI from 2017 demonstrating a disc bulge at L5-S1 with compression of the left S1 nerve root as well as L4-L5 lateral recess stenosis with possible compression of left L5 root -No current radicular symptoms today -Discussed the importance of adequate lifting technique, core strengthening exercises, and a trial of temporary orthotics

## 2018-12-27 ENCOUNTER — Encounter: Payer: Self-pay | Admitting: Sports Medicine

## 2018-12-27 ENCOUNTER — Ambulatory Visit (INDEPENDENT_AMBULATORY_CARE_PROVIDER_SITE_OTHER): Payer: PPO | Admitting: Sports Medicine

## 2018-12-27 ENCOUNTER — Other Ambulatory Visit: Payer: Self-pay

## 2018-12-27 DIAGNOSIS — M2170 Unequal limb length (acquired), unspecified site: Secondary | ICD-10-CM | POA: Diagnosis not present

## 2018-12-27 DIAGNOSIS — R269 Unspecified abnormalities of gait and mobility: Secondary | ICD-10-CM | POA: Diagnosis not present

## 2018-12-27 NOTE — Assessment & Plan Note (Signed)
Patient was fitted for a : standard, cushioned, semi-rigid orthotic. The orthotic was heated and afterward the patient stood on the orthotic blank positioned on the orthotic stand. The patient was positioned in subtalar neutral position and 10 degrees of ankle dorsiflexion in a weight bearing stance. After completion of molding, a stable base was applied to the orthotic blank. The blank was ground to a stable position for weight bearing. Size: 7 red EVA Base: Blue med. Density EVA Posting: Lift on RT heel Additional orthotic padding: None  Excellent correction of gait on completion Note good comfort  Use for activity as much as possible

## 2018-12-27 NOTE — Progress Notes (Signed)
PCP: Midge Minium, MD  Subjective:   HPI: Patient is a 68 y.o. female here for new orthotics. Patient is an avid hiker and currently has sports insoles to correct a pes planus deformity as well as bilateral pronation of her feet. Patient notes her current insoles have helped significantly with her foot/ankle pain during her hikes. She has no numbness or tingling in her feet. She denies any pain radiating up her leg. She has no knee pain or hip pain.  Note years ago we had placed her in custom orthotics for foot pain that occurred with running/ hiking but went away after she switched to biking.  Past Medical History:  Diagnosis Date  . Chronic lower back pain   . Heart murmur     Current Outpatient Medications on File Prior to Visit  Medication Sig Dispense Refill  . acetaminophen (TYLENOL) 650 MG CR tablet Take 650 mg by mouth as needed.     . Calcium Carbonate-Vitamin D (CALCIUM 500 + D PO) Take by mouth daily.    . meloxicam (MOBIC) 15 MG tablet Take 15 mg by mouth as needed.     . nitroGLYCERIN (NITRODUR - DOSED IN MG/24 HR) 0.2 mg/hr patch Place 1/4 to 1/2 of a patch over affected region. Remove and replace once daily.  Slightly alter skin placement daily 30 patch 1  . Red Yeast Rice Extract (RED YEAST RICE PO) Take by mouth.     No current facility-administered medications on file prior to visit.     Past Surgical History:  Procedure Laterality Date  . ABLATION     Radio frequency lower back  . COLONOSCOPY    . COLONOSCOPY W/ BIOPSIES     hyperplastic polyp  . KNEE SURGERY Right   . POLYPECTOMY      Allergies  Allergen Reactions  . Sulfonamide Derivatives Rash     Family History  Problem Relation Age of Onset  . Hypertension Mother   . Cancer Father        throat  . Cancer Maternal Grandmother        bone  . Cancer Maternal Grandfather        colon  . Colon cancer Maternal Grandfather        ? early 108's  . Esophageal cancer Neg Hx   . Pancreatic  cancer Neg Hx   . Rectal cancer Neg Hx   . Stomach cancer Neg Hx     BP 132/80   Ht 5\' 5"  (1.651 m)   Wt 130 lb (59 kg)   BMI 21.63 kg/m   Review of Systems: See HPI above.     Objective:  Physical Exam:  Gen: NAD, comfortable in exam room Lungs: Breathing comfortably on room air MSK Foot/ankle bilaterally -Inspection: Patient with bilateral pes planus deformity. Patient also with 10 degrees of pronation bilaterally -Palpation: No tenderness to palpation -Gait: Patient with ~10 degrees of pronation during walking. RT > LT Note RT leg is ~ 1 cm shorter   Assessment & Plan:  Patient is a 68 y.o. female here for new orthotics  1. Pes planus deformity with pronation -Custom orthotics made during visit -Gait analysis following orthotics revealed correction of her pronation with walking Patient expressed good comfort with using these and much better foot support. -Patient to follow up as needed for further modifications to her orthotics

## 2018-12-27 NOTE — Assessment & Plan Note (Signed)
Correction with small heel lift on custom orthotic seemed to balance her pelvic position well

## 2019-02-03 ENCOUNTER — Ambulatory Visit: Payer: PPO | Admitting: Family Medicine

## 2019-02-14 ENCOUNTER — Ambulatory Visit (INDEPENDENT_AMBULATORY_CARE_PROVIDER_SITE_OTHER): Payer: PPO | Admitting: Family Medicine

## 2019-02-14 ENCOUNTER — Encounter: Payer: Self-pay | Admitting: Family Medicine

## 2019-02-14 ENCOUNTER — Other Ambulatory Visit: Payer: Self-pay

## 2019-02-14 VITALS — BP 126/82 | HR 76 | Temp 98.1°F | Resp 16 | Ht 65.0 in | Wt 132.2 lb

## 2019-02-14 DIAGNOSIS — Z0000 Encounter for general adult medical examination without abnormal findings: Secondary | ICD-10-CM

## 2019-02-14 DIAGNOSIS — E785 Hyperlipidemia, unspecified: Secondary | ICD-10-CM

## 2019-02-14 DIAGNOSIS — Z23 Encounter for immunization: Secondary | ICD-10-CM | POA: Diagnosis not present

## 2019-02-14 LAB — CBC WITH DIFFERENTIAL/PLATELET
Basophils Absolute: 0 10*3/uL (ref 0.0–0.1)
Basophils Relative: 0.6 % (ref 0.0–3.0)
Eosinophils Absolute: 0.2 10*3/uL (ref 0.0–0.7)
Eosinophils Relative: 4.2 % (ref 0.0–5.0)
HCT: 42.4 % (ref 36.0–46.0)
Hemoglobin: 14 g/dL (ref 12.0–15.0)
Lymphocytes Relative: 24.2 % (ref 12.0–46.0)
Lymphs Abs: 1.2 10*3/uL (ref 0.7–4.0)
MCHC: 32.9 g/dL (ref 30.0–36.0)
MCV: 90.7 fl (ref 78.0–100.0)
Monocytes Absolute: 0.4 10*3/uL (ref 0.1–1.0)
Monocytes Relative: 8.1 % (ref 3.0–12.0)
Neutro Abs: 3.1 10*3/uL (ref 1.4–7.7)
Neutrophils Relative %: 62.9 % (ref 43.0–77.0)
Platelets: 123 10*3/uL — ABNORMAL LOW (ref 150.0–400.0)
RBC: 4.68 Mil/uL (ref 3.87–5.11)
RDW: 13.8 % (ref 11.5–15.5)
WBC: 4.9 10*3/uL (ref 4.0–10.5)

## 2019-02-14 LAB — HEPATIC FUNCTION PANEL
ALT: 16 U/L (ref 0–35)
AST: 18 U/L (ref 0–37)
Albumin: 4.3 g/dL (ref 3.5–5.2)
Alkaline Phosphatase: 56 U/L (ref 39–117)
Bilirubin, Direct: 0.2 mg/dL (ref 0.0–0.3)
Total Bilirubin: 1.1 mg/dL (ref 0.2–1.2)
Total Protein: 6.6 g/dL (ref 6.0–8.3)

## 2019-02-14 LAB — BASIC METABOLIC PANEL
BUN: 18 mg/dL (ref 6–23)
CO2: 32 mEq/L (ref 19–32)
Calcium: 9.3 mg/dL (ref 8.4–10.5)
Chloride: 103 mEq/L (ref 96–112)
Creatinine, Ser: 0.78 mg/dL (ref 0.40–1.20)
GFR: 73.61 mL/min (ref >60.00)
Glucose, Bld: 87 mg/dL (ref 70–99)
Potassium: 4.4 mEq/L (ref 3.5–5.1)
Sodium: 141 mEq/L (ref 135–145)

## 2019-02-14 LAB — LIPID PANEL
Cholesterol: 233 mg/dL — ABNORMAL HIGH (ref 0–200)
HDL: 86.6 mg/dL (ref >39.00)
LDL Cholesterol: 134 mg/dL — ABNORMAL HIGH (ref 0–99)
NonHDL: 146.3
Total CHOL/HDL Ratio: 3
Triglycerides: 63 mg/dL (ref 0.0–149.0)
VLDL: 12.6 mg/dL (ref 0.0–40.0)

## 2019-02-14 LAB — TSH: TSH: 0.75 u[IU]/mL (ref 0.35–4.50)

## 2019-02-14 NOTE — Assessment & Plan Note (Signed)
Chronic problem.  On Red Yeast Rice daily.  Exercising regularly.  Check labs.  Adjust meds prn

## 2019-02-14 NOTE — Assessment & Plan Note (Signed)
Pt's PE WNL. UTD on pap, mammo, colonoscopy.  Pneumovax and flu given today.  Check labs.  Anticipatory guidance provided.

## 2019-02-14 NOTE — Progress Notes (Signed)
Nicole Benson 68 y.o. female presents to office today for yearly physical. Per PCP, Annye Asa, MD, administered Pneumovax-23 0.5 mL IM left arm. Patient tolerated well.

## 2019-02-14 NOTE — Addendum Note (Signed)
Addended by: Doran Clay A on: 02/14/2019 12:24 PM   Modules accepted: Orders

## 2019-02-14 NOTE — Patient Instructions (Signed)
Follow up in 1 year or as needed We'll notify you of your lab results and make any changes if needed Keep up the good work on healthy diet and regular exercise- you look great! Schedule your mammogram for late Oct/November Call with any questions or concerns Stay Safe!!

## 2019-02-14 NOTE — Progress Notes (Signed)
   Subjective:    Patient ID: Nicole Benson, female    DOB: 31-Jan-1951, 68 y.o.   MRN: [id number]  HPI Here today for CPE and MWV.  Risk Factors: Hyperlipidemia- chronic problem, pt on Red Yeast Rice Physical Activity: biking and yoga Fall Risk: low risk Depression: denies current sxs Hearing: normal to conversational tones and whispered voice at 6 ft ADL's: independent Cognitive: normal linear thought process, memory and attention intact Home Safety: safe at home Height, Weight, BMI, Visual Acuity: see vitals, vision corrected to 20/20 w/ glasses Counseling: UTD on colonoscopy, mammo, pap smear.  Due for Flu and Pneumovax Health Care POA/Living Will: has both Labs Ordered: See A&P Care Plan: See A&P    Review of Systems Patient reports no vision/ hearing changes, adenopathy,fever, weight change,  persistant/recurrent hoarseness , swallowing issues, chest pain, palpitations, edema, persistant/recurrent cough, hemoptysis, dyspnea (rest/exertional/paroxysmal nocturnal), gastrointestinal bleeding (melena, rectal bleeding), abdominal pain, significant heartburn, bowel changes, GU symptoms (dysuria, hematuria, incontinence), Gyn symptoms (abnormal  bleeding, pain),  syncope, focal weakness, memory loss, numbness & tingling, skin/hair/nail changes, abnormal bruising or bleeding, anxiety, or depression.     Objective:   Physical Exam General Appearance:    Alert, cooperative, no distress, appears stated age  Head:    Normocephalic, without obvious abnormality, atraumatic  Eyes:    PERRL, conjunctiva/corneas clear, EOM's intact, fundi    benign, both eyes  Ears:    Normal TM's and external ear canals, both ears  Nose:   Deferred due to COVID  Throat:   Neck:   Supple, symmetrical, trachea midline, no adenopathy;    Thyroid: no enlargement/tenderness/nodules  Back:     Symmetric, no curvature, ROM normal, no CVA tenderness  Lungs:     Clear to auscultation bilaterally, respirations  unlabored  Chest Wall:    No tenderness or deformity   Heart:    Regular rate and rhythm, S1 and S2 normal, no murmur, rub   or gallop  Breast Exam:    Deferred to mammo  Abdomen:     Soft, non-tender, bowel sounds active all four quadrants,    no masses, no organomegaly  Genitalia:    Deferred  Rectal:    Extremities:   Extremities normal, atraumatic, no cyanosis or edema  Pulses:   2+ and symmetric all extremities  Skin:   Skin color, texture, turgor normal, no rashes or lesions  Lymph nodes:   Cervical, supraclavicular, and axillary nodes normal  Neurologic:   CNII-XII intact, normal strength, sensation and reflexes    throughout          Assessment & Plan:

## 2019-02-15 ENCOUNTER — Encounter: Payer: Self-pay | Admitting: General Practice

## 2019-03-22 NOTE — Progress Notes (Signed)
Nicole Benson - 68 y.o. female MRN [id number]  Date of birth: [birth year]  Office Visit Note: Visit Date: 05/19/2018 PCP: Midge Minium, MD Referred by: Midge Minium, MD  Subjective: Chief Complaint  Patient presents with  . Lower Back - Pain   HPI: Nicole Benson is a 68 y.o. female who comes in today For patient and chronic worsening axial low back pain.  Brief review of history the patient was followed at [facility] for back pain and ultimately underwent double diagnostic medial branch blocks and some form of experimental electrostimulation but I am really unaware of how it works but it did not seem to help much.  Nonetheless when we saw her we suggested the fact that she mainly had facet arthropathy without radicular pain and she had really completed every other criteria we completed radiofrequency ablation of the lower facet joints.  She comes in today stating that the ablation back in March did not seem to help that much but she has had worsening pain over the last month.  If it did help usually we get 9 months to a year but technically could do the ablation every 6 months.  She actually is past the 40-month mark.  She does report that she did not feel like it gave her that much relief.  Her pain is dull and aching and constant at this point is worse with both sitting and standing.  She has had no new trauma or falls.  She does get some relief with stretching.  She has had no change in medications or any other problems.  No red flag complaints.  Review of Systems  Constitutional: Negative for chills, fever, malaise/fatigue and weight loss.  HENT: Negative for hearing loss and sinus pain.   Eyes: Negative for blurred vision, double vision and photophobia.  Respiratory: Negative for cough and shortness of breath.   Cardiovascular: Negative for chest pain, palpitations and leg swelling.  Gastrointestinal: Negative for abdominal pain, nausea and vomiting.  Genitourinary: Negative  for flank pain.  Musculoskeletal: Positive for back pain. Negative for myalgias.  Skin: Negative for itching and rash.  Neurological: Negative for tremors, focal weakness and weakness.  Endo/Heme/Allergies: Negative.   Psychiatric/Behavioral: Negative for depression.  All other systems reviewed and are negative.  Otherwise per HPI.  Assessment & Plan: Visit Diagnoses:  1. Chronic bilateral low back pain without sciatica   2. Spondylosis without myelopathy or radiculopathy, lumbar region   3. Myofascial pain syndrome     Plan: Findings:  Chronic history of mechanical low back pain treated at first at [facility] and she went through double diagnostic medial branch blocks there.  We did not perform those blocks but we did have reports.  We went ahead and completed radiofrequency ablation to see if it would help.  It may have helped some but obviously her report today she did not feel like it helped as much as she would hope for.  It has been since March though since we did that.  She is really not had any other care since then and most of her pain complaints have started over the last month or so.  She clearly has facet arthropathy but she also has myofascial pain as well.  She has no reason to believe this is anything nerve related.  At this point I think regrouping with a physical therapist for dry needling particularly would be good.  We also talked about massage therapy.  We did make a referral  to [facility] physical therapy on [address] as I have had good results with them.  We will see her back as needed.  Would consider updating MRI of the lumbar spine.  She did have disc herniation prior to all this but that had resolved release from a pain standpoint.    Meds & Orders: No orders of the defined types were placed in this encounter.   Orders Placed This Encounter  Procedures  . Ambulatory referral to Physical Therapy    Follow-up: No follow-ups on file.   Procedures: No procedures  performed  No notes on file   Clinical History: Lumbar spine MRI dated 10/28/2016 IMPRESSION: Degenerative changes of the lumbar spine. Most notably at L5/S1 there is a left paracentral disc extrusion which appears to abut the traversing left-sided nerve roots which are displaced. Mild central canal stenosis and moderate bilateral foraminal  stenosis.  Facet degenerative changes lower lumbar spine.  FINDINGS:  Bones:Vertebral body heights are maintained. No acute fracture. No focal lesions. Spinal cord/conus: No abnormal signal or mass.Conus terminates at normal level. Alignment: No significant subluxation.  Degenerative changes: Disc desiccation signal L2/L3-L5/S1. T12-L1: No significant foraminal or central canal stenosis.No disc herniation.  L1-L2: No significant foraminal or central canal stenosis.No disc herniation.   L2-L3: Disc space height loss. Minimal disc bulge. Mild facet degenerative changes. Slight effacement of the anterior aspect of the thecal sac. No significant foraminal stenosis.  L3-L4: Mild facet degenerative changes. No significant foraminal or central canal stenosis.No disc herniation.   L4-L5: Mild to moderate bilateral facet degenerative changes with facet joint effusion. No significant foraminal or central canal stenosis.No disc herniation.  L5-S1: Moderate disc space height loss. Modic type II endplate degenerative changes. Left paracentral disc extrusion. This appears to abut and mildly displace the traversing left S1 nerve roots. Mild central canal stenosis. Moderate bilateral facet  degenerative changes. Moderate bilateral foraminal stenosis.   Contrast: No unexpected contrast enhancement. Specifically no visualized masses or peripheral enhancing focal fluid collections.  Paraspinal soft tissues: Unremarkable  Incidental:  None.   She reports that she has never smoked. She has never used smokeless tobacco. No results for  input(s): HGBA1C, LABURIC in the last 8760 hours.  Objective:  VS:  HT:5' 4.5" (163.8 cm)   WT:127 lb (57.6 kg)  BMI:21.47    BP:134/84  HR:(!) 50bpm  TEMP: ( )  RESP:100 % Physical Exam Vitals signs and nursing note reviewed.  Constitutional:      General: She is not in acute distress.    Appearance: Normal appearance. She is well-developed. She is not ill-appearing.  HENT:     Head: Normocephalic and atraumatic.  Eyes:     Conjunctiva/sclera: Conjunctivae normal.     Pupils: Pupils are equal, round, and reactive to light.  Cardiovascular:     Rate and Rhythm: Normal rate.     Pulses: Normal pulses.  Pulmonary:     Effort: Pulmonary effort is normal.  Musculoskeletal:     Right lower leg: No edema.     Left lower leg: No edema.     Comments: Patient ambulates without aid she has good distal strength without clonus.  She has no pain with hip rotation she does have concordant low back pain with extension and facet loading.  She has myofascial trigger points in the quadratus lumborum and paraspinal musculature and somewhat over the gluteus medius bilaterally.  No pain over the greater trochanters.  Skin:    General: Skin is warm and dry.  Findings: No erythema or rash.  Neurological:     General: No focal deficit present.     Mental Status: She is alert and oriented to person, place, and time.     Sensory: No sensory deficit.     Motor: No abnormal muscle tone.     Coordination: Coordination normal.     Gait: Gait normal.  Psychiatric:        Mood and Affect: Mood normal.        Behavior: Behavior normal.     Ortho Exam Imaging: No results found.  Past Medical/Family/Surgical/Social History: Medications & Allergies reviewed per EMR, new medications updated. Patient Active Problem List   Diagnosis Date Noted  . Hyperlipidemia 02/14/2019  . Abnormality of gait 12/27/2018  . Leg length discrepancy 11/22/2018  . Physical exam 04/29/2017  . Polyarthralgia 03/29/2017   . Allergic rhinitis 12/10/2016  . Low back pain 12/10/2016  . HIP PAIN, RIGHT 10/18/2009  . SUBSCAPULARIS SPRAIN AND STRAIN 02/21/2009  . 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 Persistent disorder of initiating or maintaining sleep 12/23/2006  . MENISCUS INJURY 12/23/2006   Past Medical History:  Diagnosis Date  . Chronic lower back pain   . Heart murmur    Family History  Problem Relation Age of Onset  . Hypertension Mother   . Cancer Father        throat  . Cancer Maternal Grandmother        bone  . Cancer Maternal Grandfather        colon  . Colon cancer Maternal Grandfather        ? early 33's  . Esophageal cancer Neg Hx   . Pancreatic cancer Neg Hx   . Rectal cancer Neg Hx   . Stomach cancer Neg Hx    Past Surgical History:  Procedure Laterality Date  . ABLATION     Radio frequency lower back  . COLONOSCOPY    . COLONOSCOPY W/ BIOPSIES     hyperplastic polyp  . KNEE SURGERY Right   . POLYPECTOMY     Social History   Occupational History  . Occupation: botanist  Tobacco Use  . Smoking status: Never Smoker  . Smokeless tobacco: Never Used  Substance and Sexual Activity  . Alcohol use: Yes    Alcohol/week: 7.0 standard drinks    Types: 7 Glasses of wine per week    Comment: 4 drinks a week  . Drug use: No  . Sexual activity: Yes    Partners: Male

## 2019-04-20 ENCOUNTER — Ambulatory Visit (INDEPENDENT_AMBULATORY_CARE_PROVIDER_SITE_OTHER): Payer: PPO | Admitting: Podiatry

## 2019-04-20 ENCOUNTER — Other Ambulatory Visit: Payer: Self-pay

## 2019-04-20 DIAGNOSIS — L603 Nail dystrophy: Secondary | ICD-10-CM

## 2019-04-20 DIAGNOSIS — B351 Tinea unguium: Secondary | ICD-10-CM

## 2019-04-20 NOTE — Patient Instructions (Signed)

## 2019-04-21 DIAGNOSIS — L603 Nail dystrophy: Secondary | ICD-10-CM | POA: Diagnosis not present

## 2019-04-21 DIAGNOSIS — A499 Bacterial infection, unspecified: Secondary | ICD-10-CM | POA: Diagnosis not present

## 2019-04-21 DIAGNOSIS — B351 Tinea unguium: Secondary | ICD-10-CM | POA: Diagnosis not present

## 2019-04-21 DIAGNOSIS — L608 Other nail disorders: Secondary | ICD-10-CM | POA: Diagnosis not present

## 2019-04-26 NOTE — Progress Notes (Signed)
Subjective:   Patient ID: Nicole Benson, female   DOB: 68 y.o.   MRN: [id number]   HPI 68 year old female presents the office today for concerns of toenail fungus to both of her big toenails.  Become thickened discolored.  She had no recent treatment for this.  No swelling or redness.  Occasionally, uncomfortable no significant pain today.   Review of Systems  All other systems reviewed and are negative.  Past Medical History:  Diagnosis Date  . Chronic lower back pain   . Heart murmur     Past Surgical History:  Procedure Laterality Date  . ABLATION     Radio frequency lower back  . COLONOSCOPY    . COLONOSCOPY W/ BIOPSIES     hyperplastic polyp  . KNEE SURGERY Right   . POLYPECTOMY       Current Outpatient Medications:  .  Calcium Carbonate-Vitamin D (CALCIUM 500 + D PO), Take by mouth daily., Disp: , Rfl:  .  fluticasone (FLONASE) 50 MCG/ACT nasal spray, Place into both nostrils daily., Disp: , Rfl:  .  levocetirizine (XYZAL) 5 MG tablet, Take 5 mg by mouth every evening., Disp: , Rfl:  .  meloxicam (MOBIC) 15 MG tablet, Take 15 mg by mouth as needed. , Disp: , Rfl:  .  Red Yeast Rice Extract (RED YEAST RICE PO), Take by mouth., Disp: , Rfl:  .  acetaminophen (TYLENOL) 650 MG CR tablet, Take 650 mg by mouth as needed. , Disp: , Rfl:   Allergies  Allergen Reactions  . Sulfonamide Derivatives Rash          Objective:  Physical Exam  General: AAO x3, NAD  Dermatological: Bilateral hallux toenails are hypertrophic, dystrophic with yellow-brown discoloration with distal dark discoloration right hallux toenail.  There is no extension of any hyperpigmentation of the surrounding skin around the nailbed that I can see.  No pain in the nails no redness or drainage or any signs of infection.  There is no open lesions.  Vascular: Dorsalis Pedis artery and Posterior Tibial artery pedal pulses are 2/4 bilateral with immedate capillary fill time. There is no pain with calf  compression, swelling, warmth, erythema.   Neruologic: Grossly intact via light touch bilateral. Protective threshold with Semmes Wienstein monofilament intact to all pedal sites bilateral.    Musculoskeletal: No gross boney pedal deformities bilateral. No pain, crepitus, or limitation noted with foot and ankle range of motion bilateral. Muscular strength 5/5 in all groups tested bilateral.  Gait: Unassisted, Nonantalgic.      Assessment:    Onychomycosis    Plan:  -Treatment options discussed including all alternatives, risks, and complications -Etiology of symptoms were discussed -We discussed treatment options the nails.  I debrided both of the hallux toenails and sent these for nail culture/biopsy.  We discussed treatment options were once the results of the culture before proceeding conservative treatment.  Return for after nail culture.  Trula Slade DPM

## 2019-04-27 DIAGNOSIS — H40013 Open angle with borderline findings, low risk, bilateral: Secondary | ICD-10-CM | POA: Diagnosis not present

## 2019-04-27 DIAGNOSIS — H04123 Dry eye syndrome of bilateral lacrimal glands: Secondary | ICD-10-CM | POA: Diagnosis not present

## 2019-05-01 DIAGNOSIS — Z1231 Encounter for screening mammogram for malignant neoplasm of breast: Secondary | ICD-10-CM | POA: Diagnosis not present

## 2019-05-02 LAB — HM MAMMOGRAPHY

## 2019-05-04 ENCOUNTER — Telehealth: Payer: Self-pay | Admitting: *Deleted

## 2019-05-04 NOTE — Telephone Encounter (Signed)
Left message with female answering the phone to have pt call for results, and he hung up prior to my getting his name.

## 2019-05-04 NOTE — Telephone Encounter (Signed)
-----   Message from Trula Slade, DPM sent at 04/28/2019 10:33 AM EST ----- Val- please let her know that the culture of the nails did not show fungus but damage to the nails. We can do a topical urea and a biotin supplement. There is a chance it could be a false negative and still have some fungus. I would not start oral medication but can always do the compound through [place] for nail fungus so it has urea in it.

## 2019-05-09 NOTE — Telephone Encounter (Signed)
Left message informing pt I had left a message informing a female that answered the phone, that I had results to discuss and to have pt call, I would mail dr. Leigh Aurora review and recommendations. Mailed letter to pt.

## 2019-05-23 ENCOUNTER — Other Ambulatory Visit: Payer: Self-pay

## 2019-05-23 ENCOUNTER — Encounter: Payer: Self-pay | Admitting: Sports Medicine

## 2019-05-23 ENCOUNTER — Ambulatory Visit (INDEPENDENT_AMBULATORY_CARE_PROVIDER_SITE_OTHER): Payer: PPO | Admitting: Sports Medicine

## 2019-05-23 DIAGNOSIS — M7122 Synovial cyst of popliteal space [Baker], left knee: Secondary | ICD-10-CM | POA: Diagnosis not present

## 2019-05-23 NOTE — Patient Instructions (Signed)
It was so nice to meet you today.  It looks like you have a Baker's cyst in your left knee.  This is very likely contributing to a lot of the discomfort that you have been feeling for the past 2 months.  The best first step for treating this Baker's cyst will be compression and quadriceps strengthening exercises.  If you do not experience any improvement in the next week or 2, please come back to clinic and we will consider doing a steroid injection.  This can be helpful in reducing inflammation in your joint space.  It is also possible that you have some additional knee issues unrelated to the Baker's cyst.  Instead of moving forward with a big knee work-up, lets treat the Baker's cyst and see how your symptoms improve.

## 2019-05-23 NOTE — Assessment & Plan Note (Signed)
History, physical and ultrasound consistent with discomfort related to left knee Baker's cyst.  However, some of her symptoms/findings are not consistent with a Baker's cyst (discomfort with valgus pressure and sharp pain with knee flexion).  Would prefer to treat Baker's cyst for now and observe symptoms for improvement.  Initial treatment will be knee compression and quadricep strengthening exercises at home.  If she does not have any significant improvement in the next week or 2, return to clinic for steroid injection.  Ultrasound indicates that she may have an intra-articular meniscal injury or possible osteoarthritis.  Will address Baker's cyst first and assess for symptom improvement before further work-up.

## 2019-05-23 NOTE — Progress Notes (Signed)
   Nicole Benson is a 68 y.o. female who presents to [facility] today for the following: Left knee pain for 2 months.  Nicole Benson reports that she has been having achy knee pain for the past 2 months.  She is not aware of any initial trauma.  She describes this pain as an achy feeling usually behind her knee.  In general this is only a mild in severity and increases to a 5 out of 10 on bad days.  She occasionally experiences sharp pain with extreme knee flexion.  She notices knee pain mostly after longer strenuous hikes.  Walking on flat surfaces does not seem to irritate her.  She also recently did a lot of housework for her mother which included walking up and down stairs up and down a stepladder.  At the end of the day, she noticed significant discomfort in her knee.  She also notices some knee discomfort simply with standing for long periods.  She has not noted any knee clicking or locking.  Her knees have not given out.  She recently treated herself with 1 week of meloxicam daily which she had leftover from a previous injury.  She noticed significant symptom relief in her knee.   PMH reviewed.  Meniscal tear of the right knee about 10 years ago. ROS as above. Medications reviewed.  Exam:  BP 132/88   Ht 5' 4.5" (1.638 m)   Wt 128 lb (58.1 kg)   BMI 21.63 kg/m  Gen: Well NAD Knee: - Inspection: no gross deformity. No erythema or bruising. Skin intact.  Mild swelling noted in the popliteal fossa - Palpation: no TTP along joint line anteriorly.  No crepitus appreciated. - ROM: full active ROM with flexion and extension in knee and hip - Strength: 5/5 strength with knee flexion and extension. - Neuro/vasc: NV intact - LIGAMENTS: negative anterior and posterior drawer, no MCL or LCL laxity.  Mild discomfort with valgus stress. -- MENISCUS: negative Thessaly   Ultrasound: Significant for Baker's cyst present in the popliteal fossa.  No Baker's cyst present in the right knee.  No obvious joint  effusion.  There is some bulging of the medial meniscus without obvious tear.  No results found.   Assessment and Plan: 1) Baker's cyst of knee, left History, physical and ultrasound consistent with discomfort related to left knee Baker's cyst.  However, some of her symptoms/findings are not consistent with a Baker's cyst (discomfort with valgus pressure and sharp pain with knee flexion).  Would prefer to treat Baker's cyst for now and observe symptoms for improvement.  Initial treatment will be knee compression and quadricep strengthening exercises at home.  If she does not have any significant improvement in the next week or 2, return to clinic for steroid injection.  Ultrasound indicates that she may have an intra-articular meniscal injury or possible osteoarthritis.  Will address Baker's cyst first and assess for symptom improvement before further work-up.  Patient seen and evaluated with the resident.  I agree with the above plan of care.  Patient has an obvious Baker's cyst on today's ultrasound.  We will treat with compression and isometric quad exercises.  She may continue meloxicam as needed also.  If symptoms persist or worsen over the next week or 2 then I would start with an intra-articular cortisone injection.  I may also consider x-rays.  Follow-up for ongoing or recalcitrant issues.

## 2019-06-27 ENCOUNTER — Other Ambulatory Visit: Payer: Self-pay

## 2019-06-27 ENCOUNTER — Ambulatory Visit (INDEPENDENT_AMBULATORY_CARE_PROVIDER_SITE_OTHER): Payer: PPO | Admitting: Sports Medicine

## 2019-06-27 VITALS — BP 140/92 | Ht 65.0 in | Wt 128.0 lb

## 2019-06-27 DIAGNOSIS — M25562 Pain in left knee: Secondary | ICD-10-CM | POA: Diagnosis not present

## 2019-06-27 MED ORDER — METHYLPREDNISOLONE ACETATE 40 MG/ML IJ SUSP
40.0000 mg | Freq: Once | INTRAMUSCULAR | Status: AC
  Administered 2019-06-27: 40 mg via INTRA_ARTICULAR

## 2019-06-28 ENCOUNTER — Encounter: Payer: Self-pay | Admitting: Sports Medicine

## 2019-06-28 NOTE — Progress Notes (Signed)
   Subjective:    Patient ID: Nicole Benson, female    DOB: 07/26/1950, 69 y.o.   MRN: [id number]  HPI   Nicole Benson comes in today for follow-up on left knee pain. Overall, there is not been any real change in her symptoms. She continues to endorse an achy discomfort primarily along the medial knee. She has been unable to enjoy hiking due to her pain. She does notice that uneven surfaces tend to make her pain worse. She denies any locking in the knee. She has not noticed any significant swelling. She does have some mild discomfort in the posterior knee from a small Baker's cyst. She has been wearing her compression sleeve with limited benefit. She has also been compliant with her isometric quad exercises. She had a right knee arthroscopy approximately 15 years ago for similar pain which was quite beneficial. She has not had any imaging of the left knee other than an ultrasound which did show a small Baker's cyst as well as degenerative changes of the medial meniscus.    Review of Systems    As above Objective:   Physical Exam  Well-developed, well-nourished. No acute distress. Awake alert and oriented x3. Vital signs reviewed  Left knee: Good range of motion. There is no obvious effusion. There is no palpable cyst in the popliteal fossa. Slight tenderness to palpation along the medial joint line but a negative Thessaly's. Knee is stable to ligamentous exam. Neurovascularly intact distally.      Assessment & Plan:   Left knee pain secondary to DJD versus degenerative meniscal tear Status post remote left knee arthroscopy for meniscal tear-doing well  Patient's left knee is injected today with cortisone. An anterior medial approach is utilized. She tolerated this without difficulty. I would like for her to continue with her compression sleeve with activity and continue with her isometric quad exercises. Follow-up with me in 3 weeks for reevaluation. If symptoms persist, I would consider further  diagnostic imaging at that time including x-ray and MRI.  Consent obtained and verified. Time-out conducted. Noted no overlying erythema, induration, or other signs of local infection. Skin prepped in a sterile fashion. Topical analgesic spray: Ethyl chloride. Joint: left knee Needle: 25g 1.5 inch Completed without difficulty. Meds: 3cc 1% xylocaine, 1cc (40mg ) depomedrol  Advised to call if fevers/chills, erythema, induration, drainage, or persistent bleeding.

## 2019-07-11 ENCOUNTER — Ambulatory Visit: Payer: PPO

## 2019-07-18 ENCOUNTER — Ambulatory Visit: Payer: PPO | Admitting: Sports Medicine

## 2019-07-20 ENCOUNTER — Ambulatory Visit: Payer: PPO

## 2019-08-22 ENCOUNTER — Ambulatory Visit: Payer: PPO | Admitting: Gastroenterology

## 2019-09-27 ENCOUNTER — Encounter (HOSPITAL_COMMUNITY): Payer: Self-pay

## 2019-09-27 ENCOUNTER — Emergency Department (HOSPITAL_COMMUNITY)
Admission: EM | Admit: 2019-09-27 | Discharge: 2019-09-27 | Disposition: A | Discharge status: Home / Self Care | Payer: PPO | Attending: Emergency Medicine | Admitting: Emergency Medicine

## 2019-09-27 ENCOUNTER — Encounter: Payer: Self-pay | Admitting: Family Medicine

## 2019-09-27 ENCOUNTER — Ambulatory Visit (INDEPENDENT_AMBULATORY_CARE_PROVIDER_SITE_OTHER): Payer: PPO | Admitting: Family Medicine

## 2019-09-27 ENCOUNTER — Other Ambulatory Visit: Payer: Self-pay

## 2019-09-27 ENCOUNTER — Emergency Department (HOSPITAL_COMMUNITY): Payer: PPO

## 2019-09-27 VITALS — BP 124/80 | HR 51 | Temp 97.9°F | Resp 16

## 2019-09-27 DIAGNOSIS — S68119A Complete traumatic metacarpophalangeal amputation of unspecified finger, initial encounter: Secondary | ICD-10-CM

## 2019-09-27 DIAGNOSIS — S68629A Partial traumatic transphalangeal amputation of unspecified finger, initial encounter: Secondary | ICD-10-CM | POA: Diagnosis not present

## 2019-09-27 DIAGNOSIS — S68627A Partial traumatic transphalangeal amputation of left little finger, initial encounter: Secondary | ICD-10-CM | POA: Insufficient documentation

## 2019-09-27 DIAGNOSIS — S6992XA Unspecified injury of left wrist, hand and finger(s), initial encounter: Secondary | ICD-10-CM | POA: Diagnosis not present

## 2019-09-27 DIAGNOSIS — W230XXA Caught, crushed, jammed, or pinched between moving objects, initial encounter: Secondary | ICD-10-CM | POA: Diagnosis not present

## 2019-09-27 DIAGNOSIS — Y929 Unspecified place or not applicable: Secondary | ICD-10-CM | POA: Insufficient documentation

## 2019-09-27 DIAGNOSIS — Y9389 Activity, other specified: Secondary | ICD-10-CM | POA: Diagnosis not present

## 2019-09-27 DIAGNOSIS — Y999 Unspecified external cause status: Secondary | ICD-10-CM | POA: Diagnosis not present

## 2019-09-27 DIAGNOSIS — S68127A Partial traumatic metacarpophalangeal amputation of left little finger, initial encounter: Secondary | ICD-10-CM | POA: Diagnosis not present

## 2019-09-27 DIAGNOSIS — Z23 Encounter for immunization: Secondary | ICD-10-CM

## 2019-09-27 MED ORDER — CEFAZOLIN SODIUM 1 G IJ SOLR
1.0000 g | Freq: Once | INTRAMUSCULAR | Status: AC
  Administered 2019-09-27: 12:00:00 1 g via INTRAMUSCULAR
  Filled 2019-09-27: qty 10

## 2019-09-27 MED ORDER — KETOROLAC TROMETHAMINE 60 MG/2ML IM SOLN
60.0000 mg | Freq: Once | INTRAMUSCULAR | Status: AC
  Administered 2019-09-27: 60 mg via INTRAMUSCULAR

## 2019-09-27 MED ORDER — STERILE WATER FOR INJECTION IJ SOLN
INTRAMUSCULAR | Status: AC
  Administered 2019-09-27: 10 mL
  Filled 2019-09-27: qty 10

## 2019-09-27 MED ORDER — CEPHALEXIN 500 MG PO CAPS: 500.0000 mg | ORAL_CAPSULE | Freq: Four times a day (QID) | ORAL | 0 refills | Status: DC

## 2019-09-27 MED ORDER — LIDOCAINE-EPINEPHRINE (PF) 2 %-1:200000 IJ SOLN
10.0000 mL | Freq: Once | INTRAMUSCULAR | Status: AC
  Administered 2019-09-27: 10 mL
  Filled 2019-09-27: qty 20

## 2019-09-27 NOTE — Discharge Instructions (Addendum)
Please take the medicines prescribed to prevent infection. Follow up with Dr. Caralyn Guile on Friday at 3 pm.  Thank you for the opportunity to take care of you.

## 2019-09-27 NOTE — ED Triage Notes (Addendum)
Patient states she was adjusting the sliding screen on her door and her left "pinky" finger was shut in the door. Patient went to her PCP first thing this Am and was referred to the ED. Patient states she received a pain injection and a tetanus prior to arrival to the ED.

## 2019-09-27 NOTE — ED Provider Notes (Signed)
[place] DEPT Provider Note   CSN: [id number] Arrival date & time: 09/27/19  [id number]     History No chief complaint on file.   Nicole Benson is a 69 y.o. female.  HPI     69 year old female comes in a chief complaint of finger injury. Patient was closing a sliding door and accidentally ended up injuring her left finger.  Patient has partial amputation of the finger.  She went to her PCP and was given Toradol IM, tetanus IM and sent to the ER for further evaluation.  Patient is overall healthy woman. She denies significant pain at this time.  Past Medical History:  Diagnosis Date  . Chronic lower back pain   . Heart murmur     Patient Active Problem List   Diagnosis Date Noted  . Baker's cyst of knee, left 05/23/2019  . Hyperlipidemia 02/14/2019  . Abnormality of gait 12/27/2018  . Leg length discrepancy 11/22/2018  . Physical exam 04/29/2017  . Polyarthralgia 03/29/2017  . Allergic rhinitis 12/10/2016  . Low back pain 12/10/2016  . HIP PAIN, RIGHT 10/18/2009  . SUBSCAPULARIS SPRAIN AND STRAIN 02/21/2009  . 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 Persistent disorder of initiating or maintaining sleep 12/23/2006  . MENISCUS INJURY 12/23/2006    Past Surgical History:  Procedure Laterality Date  . ABLATION     Radio frequency lower back  . COLONOSCOPY    . COLONOSCOPY W/ BIOPSIES     hyperplastic polyp  . KNEE SURGERY Right   . POLYPECTOMY       OB History   No obstetric history on file.     Family History  Problem Relation Age of Onset  . Hypertension Mother   . Cancer Father        throat  . Cancer Maternal Grandmother        bone  . Cancer Maternal Grandfather        colon  . Colon cancer Maternal Grandfather        ? early 90's  . Esophageal cancer Neg Hx   . Pancreatic cancer Neg Hx   . Rectal cancer Neg Hx   . Stomach cancer Neg Hx     Social History   Tobacco Use  . Smoking status: Never Smoker  . Smokeless  tobacco: Never Used  Substance Use Topics  . Alcohol use: Yes    Alcohol/week: 7.0 standard drinks    Types: 7 Glasses of wine per week    Comment: 4 drinks a week  . Drug use: No    Home Medications Prior to Admission medications   Medication Sig Start Date End Date Taking? Authorizing Provider  acetaminophen (TYLENOL) 650 MG CR tablet Take 650 mg by mouth as needed.     [provider]  Calcium Carbonate-Vitamin D (CALCIUM 500 + D PO) Take by mouth daily.    [provider]  cephALEXin (KEFLEX) 500 MG capsule Take 1 capsule (500 mg total) by mouth 4 (four) times daily. 09/27/19   Varney Biles, MD  levocetirizine (XYZAL) 5 MG tablet Take 5 mg by mouth every evening.    [provider]  meloxicam (MOBIC) 15 MG tablet Take 15 mg by mouth as needed.     [provider]  Red Yeast Rice Extract (RED YEAST RICE PO) Take by mouth.    [provider]    Allergies    Sulfonamide derivatives  Review of Systems   Review of Systems  Constitutional: Positive  for activity change.  Skin: Positive for wound.  Allergic/Immunologic: Negative for immunocompromised state.  Neurological: Negative for numbness.    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  Temp 98.1 F (36.7 C) (Oral)   Resp 18   Ht 5' 4.5" (1.638 m)   Wt 56.7 kg   SpO2 100%   BMI 21.12 kg/m   Physical Exam Vitals and nursing note reviewed.  Constitutional:      Appearance: She is well-developed.  HENT:     Head: Atraumatic.  Cardiovascular:     Rate and Rhythm: Normal rate.  Pulmonary:     Effort: Pulmonary effort is normal.  Abdominal:     General: Bowel sounds are normal.  Musculoskeletal:        General: No swelling, tenderness or deformity.     Cervical back: Neck supple.     Comments: Pt has amputation of the distal 5th L digit. She has about 20% of the nail still  in place.  No bony exposure appreciated  Skin:    General: Skin is warm and dry.  Neurological:     Mental Status: She is alert and oriented to person, place, and time.     ED Results / Procedures / Treatments   Labs (all labs ordered are listed, but only abnormal results are displayed) Labs Reviewed - No data to display  EKG None  Radiology DG Finger Little Left  Result Date: 09/27/2019 CLINICAL DATA:  Injury EXAM: LEFT LITTLE FINGER 2+V COMPARISON:  None. FINDINGS: Soft tissue injury to the tuft of the little finger with apparent exposure of bone. No visible fracture or opaque foreign body. No dislocation. IMPRESSION: Soft tissue injury to the distal little finger with exposed or nearly exposed tuft. No acute fracture. Electronically Signed   By: Monte Fantasia M.D.   On: 09/27/2019 10:12    Procedures .Nerve Block  Date/Time: 09/27/2019 1:12 PM Performed by: Varney Biles, MD Authorized by: Varney Biles, MD   Consent:    Consent obtained:  Verbal   Consent given by:  Patient   Risks discussed:  Infection, nerve damage and pain Universal protocol:    Procedure explained and questions answered to patient or proxy's satisfaction: yes     Patient identity confirmed:  Arm band Indications:    Indications:  Procedural anesthesia Location:    Body area:  Upper extremity   Upper extremity nerve:  Metacarpal   Laterality:  Left Pre-procedure details:    Skin preparation:  2% chlorhexidine   Preparation: Patient was prepped and draped in usual sterile fashion   Skin anesthesia (see MAR for exact dosages):    Skin anesthesia method:  None Procedure details (see MAR for exact dosages):    Block needle gauge:  25 G   Anesthetic injected:  Lidocaine 1% WITH epi   Injection procedure:  Anatomic landmarks identified   Paresthesia:  Immediately resolved Post-procedure details:    Dressing:  Sterile dressing   Outcome:  Anesthesia achieved   Patient tolerance of  procedure:  Tolerated well, no immediate complications Irrigation and debridement  Date/Time: 09/27/2019 1:14 PM Performed by: Varney Biles, MD Authorized by: Varney Biles, MD  Consent: Verbal consent obtained. Risks and benefits: risks, benefits and alternatives were discussed Consent given by: patient Patient understanding: patient states understanding of the procedure being performed Patient identity confirmed: arm band Time out: Immediately prior to  procedure a "time out" was called to verify the correct patient, procedure, equipment, support staff and site/side marked as required. Preparation: Patient was prepped and draped in the usual sterile fashion. Local anesthesia used: yes  Anesthesia: Local anesthesia used: yes  Sedation: Patient sedated: no  Patient tolerance: patient tolerated the procedure well with no immediate complications Comments: Patient's digit was anesthetized followed by cleaning of the wound with Betadine and alcohol.  Further irrigation was completed with sterile water.  About 100 cc of saline was used for irrigation.    (including critical care time)  Medications Ordered in ED Medications  lidocaine-EPINEPHrine (XYLOCAINE W/EPI) 2 %-1:200000 (PF) injection 10 mL (has no administration in time range)  ceFAZolin (ANCEF) injection 1 g (1 g Intramuscular Given 09/27/19 1158)  sterile water (preservative free) injection (10 mLs  Given 09/27/19 1204)    ED Course  I have reviewed the triage vital signs and the nursing notes.  Pertinent labs & imaging results that were available during my care of the patient were reviewed by me and considered in my medical decision making (see chart for details).    MDM Rules/Calculators/A&P                      69 year old comes in a chief complaint of finger injury. Patient has likely distal amputation of the fifth digit.  X-ray shows likely distal tuft fracture as well.  Case discussed with Hilbert Odor, who  is covering for Dr. [name]. Hilbert Odor reviewed the pictures and shared the detail of the exam and injury with Dr. [name].  Dr. [name] recommends wound care in the ER with follow-up in his clinic on Friday at 3 PM.  Patient wound was irrigated in the ED and Vaseline dressing plus splint were applied. She is aware of the follow-up plan. Ancef IM given in the ER followed by Keflex to go home.  Final Clinical Impression(s) / ED Diagnoses Final diagnoses:  Amputation of tip of finger, initial encounter    Rx / DC Orders ED Discharge Orders         Ordered    cephALEXin (KEFLEX) 500 MG capsule  4 times daily     09/27/19 1300           Varney Biles, MD 09/27/19 1315

## 2019-09-27 NOTE — Progress Notes (Signed)
   Subjective:    Patient ID: Nicole Benson, female    DOB: 11-Oct-1950, 69 y.o.   MRN: [id number]  HPI Finger injury- pt reports she shut the door this morning and cut her L 5th finger.  She isn't sure whether she saw tip of finger in door frame.  + pain, bleeding.  She has wound wrapped.  Is in no obvious distress.  Didn't know where to go so came here.   Review of Systems For ROS see HPI   This visit occurred during the SARS-CoV-2 public health emergency.  Safety protocols were in place, including screening questions prior to the visit, additional usage of staff PPE, and extensive cleaning of exam room while observing appropriate contact time as indicated for disinfecting solutions.       Objective:   Physical Exam Vitals reviewed.  Constitutional:      General: She is not in acute distress.    Appearance: Normal appearance. She is not ill-appearing.  HENT:     Head: Normocephalic and atraumatic.  Musculoskeletal:     Comments: Partial amputation of L 5th finger to just above nail bed.  Bony protrusion visible.  Skin:    General: Skin is warm and dry.  Neurological:     Mental Status: She is alert.           Assessment & Plan:  Partial finger amputation- new.  Pt's L 5th finger has been avulsed just above nail bed and bone is visible.  Wound was wrapped w/ pressure dressing, toradol given for pain, Td updated.  Pt was given instructions to proceed directly to ER.  Offered EMS transport- husband feels comfortable driving pt.  They were instructed that if she were to pass out or bleeding became uncontrollable, they were to pull over and call 911.  Pt and husband expressed understanding and agreement.  Prior to leaving, pt reports improved pain level w/ toradol administration.

## 2019-09-27 NOTE — Patient Instructions (Addendum)
Please go directly to [facility] Emergency Room You have a partial amputation of your little finger Leave hand bandaged until you arrive in the ER Keep arm elevated- above heart- if possible Lean seat back en route to ER for comfort It is normal to feel woozy, nauseous, and a pit panicked.  Deep breathing and try to remain calm.  You're doing great! Call with any questions or concerns Hang in there!

## 2019-09-27 NOTE — Progress Notes (Signed)
Pt walked into office. Had cut the tip of her left 5th digit in the car. Pt was brought back into the office where PCP evaluated finger. Pt had clipped the digit to the bone. Finger was re wrapped and pt given injection of Toradol after telling PCP that she is tolerant of NSAIDS.

## 2019-09-29 DIAGNOSIS — S6982XA Other specified injuries of left wrist, hand and finger(s), initial encounter: Secondary | ICD-10-CM | POA: Diagnosis not present

## 2019-10-05 DIAGNOSIS — M79645 Pain in left finger(s): Secondary | ICD-10-CM | POA: Diagnosis not present

## 2019-10-10 DIAGNOSIS — M79642 Pain in left hand: Secondary | ICD-10-CM | POA: Diagnosis not present

## 2019-10-17 DIAGNOSIS — M79642 Pain in left hand: Secondary | ICD-10-CM | POA: Diagnosis not present

## 2019-10-26 ENCOUNTER — Ambulatory Visit
Admission: RE | Admit: 2019-10-26 | Discharge: 2019-10-26 | Disposition: A | Discharge status: Home / Self Care | Payer: PPO | Source: Ambulatory Visit | Attending: Sports Medicine | Admitting: Sports Medicine

## 2019-10-26 ENCOUNTER — Other Ambulatory Visit: Payer: Self-pay

## 2019-10-26 ENCOUNTER — Ambulatory Visit: Payer: PPO | Admitting: Sports Medicine

## 2019-10-26 VITALS — BP 124/70 | Ht 64.0 in | Wt 128.0 lb

## 2019-10-26 DIAGNOSIS — M2170 Unequal limb length (acquired), unspecified site: Secondary | ICD-10-CM

## 2019-10-26 DIAGNOSIS — M25462 Effusion, left knee: Secondary | ICD-10-CM | POA: Diagnosis not present

## 2019-10-26 DIAGNOSIS — M25562 Pain in left knee: Secondary | ICD-10-CM

## 2019-10-26 DIAGNOSIS — M1712 Unilateral primary osteoarthritis, left knee: Secondary | ICD-10-CM | POA: Diagnosis not present

## 2019-10-26 NOTE — Progress Notes (Signed)
   Subjective:    Patient ID: Nicole Benson, female    DOB: 05-25-1951, 69 y.o.   MRN: [id number]  HPI   Nicole Benson comes in today for follow-up on left knee pain. Overall, there is not been any real change in her symptoms. She had cortisone injection at last visit in January which provided relief for about 6 weeks. She continues to endorse an achy discomfort primarily along the medial knee. She has started to have some clicking in her medial knee as well but no locking or catching. She has been unable to enjoy hiking due to her pain and has swelling in her knee after activity. She also notes discomfort in posterior knee from a Baker's cyst (diagnosed on [place] last year). She has been wearing compression sleeve, doing isometric quad exercises and primarily cycling to improve quad strength. Her knee feels similar to how her right knee in 2014 when she had a significant meniscal tear. At that time, she had arthroscopic surgery with Dr. Noemi Chapel and symptoms improved significantly.    Review of Systems    As above Objective:   Physical Exam  Well-developed, well-nourished. No acute distress. Awake alert and oriented x3. Vital signs reviewed  Left knee:  - Inspection: 1+ effusion, no erythema or bruising. Skin intact - Palpation: TTP along anterior-medial joint line as well as slight tenderness over posterior knee with fullness appreciated - ROM: full active ROM with flexion and extension in knee and hip- pain with maximal flexion of knee - Strength: 5/5 strength - Neuro/vasc: NV intact - Special Tests: - LIGAMENTS: negative anterior and posterior drawer, no MCL or LCL laxity  -- MENISCUS: negative McMurray's, pain over medial knee with Thessaly  -- PF JOINT: nml patellar mobility bilaterally.       Assessment & Plan:   Left knee pain, likely from DJD in addition to degenerative meniscal tear. Patient is starting to have mechanical symptoms, has a positive Thessaly, and she reports her left knee  currently feels similar to how her right knee felt when she had a meniscal tear. Since her symptoms have failed to improve with conservative measures of rehab exercises, injections, and compression, will proceed with x-ray of knee and MRI. She is inclined to proceed with arthroscopic surgery if she has a meniscal tear as this provided significant relief for her right knee.  Patient seen and evaluated with the sports medicine fellow. I agree with the above plan of care. Review of her x-ray today shows mild medial compartmental narrowing. We will get an MRI of the left knee specifically to rule out a meniscal tear. She has failed conservative treatment to date including a cortisone injection. She has also had excellent results in the past with a meniscectomy of the right knee done 4 years ago. Phone follow-up with MRI results when available and if meniscal tear is confirmed then consider referral to Dr. Noemi Chapel to discuss merits of arthroscopy.

## 2019-11-23 ENCOUNTER — Telehealth: Payer: Self-pay | Admitting: Sports Medicine

## 2019-11-23 ENCOUNTER — Ambulatory Visit
Admission: RE | Admit: 2019-11-23 | Discharge: 2019-11-23 | Disposition: A | Discharge status: Home / Self Care | Payer: PPO | Source: Ambulatory Visit | Attending: Sports Medicine | Admitting: Sports Medicine

## 2019-11-23 ENCOUNTER — Other Ambulatory Visit: Payer: Self-pay

## 2019-11-23 DIAGNOSIS — M25562 Pain in left knee: Secondary | ICD-10-CM

## 2019-11-23 DIAGNOSIS — S83242A Other tear of medial meniscus, current injury, left knee, initial encounter: Secondary | ICD-10-CM | POA: Diagnosis not present

## 2019-11-23 DIAGNOSIS — M25462 Effusion, left knee: Secondary | ICD-10-CM | POA: Diagnosis not present

## 2019-11-23 NOTE — Telephone Encounter (Signed)
  I spoke with the patient on the phone after reviewing MRI findings of her left knee.  She has rather extensive tears of both the medial and lateral meniscus.  She does also have some tricompartmental cartilage loss, most pronounced in the medial compartment.  She did well in the past with a right knee meniscectomy so I will refer her to Dr. Noemi Chapel to discuss merits of left knee arthroscopy.  Patient is in agreement with that plan.  I will defer further work-up and treatment to the discretion of Dr. Noemi Chapel and the patient will follow up with me as needed.

## 2019-11-24 NOTE — Progress Notes (Signed)
Pt informed of appt with Dr. Noemi Chapel on 11/28/19 @ 9:45 am for her left knee meniscus tear.

## 2019-11-28 ENCOUNTER — Telehealth: Payer: Self-pay | Admitting: General Practice

## 2019-11-28 DIAGNOSIS — S83242A Other tear of medial meniscus, current injury, left knee, initial encounter: Secondary | ICD-10-CM | POA: Diagnosis not present

## 2019-11-28 DIAGNOSIS — S83282A Other tear of lateral meniscus, current injury, left knee, initial encounter: Secondary | ICD-10-CM | POA: Diagnosis not present

## 2019-11-28 NOTE — Telephone Encounter (Signed)
Received a fax from [facility] advising that patient is having surgery next Wednesday for a left knee scope.   Last OV 09/27/19 (amputation of finger) Last CPE 02/14/19 Last EKG none on file   Please advise, do we need to have pt come in for surgical clearance?

## 2019-11-29 NOTE — Telephone Encounter (Signed)
Needs surgical clearance appt?

## 2019-11-29 NOTE — Telephone Encounter (Signed)
Can we please get pt scheduled for surgical clearance appointment with PCP sometime this week?

## 2019-12-04 ENCOUNTER — Encounter: Payer: Self-pay | Admitting: Family Medicine

## 2019-12-04 ENCOUNTER — Other Ambulatory Visit: Payer: Self-pay

## 2019-12-04 ENCOUNTER — Ambulatory Visit (INDEPENDENT_AMBULATORY_CARE_PROVIDER_SITE_OTHER): Payer: PPO | Admitting: Family Medicine

## 2019-12-04 VITALS — BP 124/68 | HR 58 | Temp 97.9°F | Resp 16 | Ht 64.0 in | Wt 128.4 lb

## 2019-12-04 DIAGNOSIS — Z01818 Encounter for other preprocedural examination: Secondary | ICD-10-CM

## 2019-12-04 DIAGNOSIS — E785 Hyperlipidemia, unspecified: Secondary | ICD-10-CM | POA: Diagnosis not present

## 2019-12-04 LAB — HEPATIC FUNCTION PANEL
ALT: 20 U/L (ref 0–35)
AST: 23 U/L (ref 0–37)
Albumin: 4.4 g/dL (ref 3.5–5.2)
Alkaline Phosphatase: 59 U/L (ref 39–117)
Bilirubin, Direct: 0.2 mg/dL (ref 0.0–0.3)
Total Bilirubin: 1.1 mg/dL (ref 0.2–1.2)
Total Protein: 6.4 g/dL (ref 6.0–8.3)

## 2019-12-04 LAB — CBC WITH DIFFERENTIAL/PLATELET
Basophils Absolute: 0 10*3/uL (ref 0.0–0.1)
Basophils Relative: 0.6 % (ref 0.0–3.0)
Eosinophils Absolute: 0.2 10*3/uL (ref 0.0–0.7)
Eosinophils Relative: 4.1 % (ref 0.0–5.0)
HCT: 41.5 % (ref 36.0–46.0)
Hemoglobin: 13.8 g/dL (ref 12.0–15.0)
Lymphocytes Relative: 23.7 % (ref 12.0–46.0)
Lymphs Abs: 1.1 10*3/uL (ref 0.7–4.0)
MCHC: 33.2 g/dL (ref 30.0–36.0)
MCV: 91 fl (ref 78.0–100.0)
Monocytes Absolute: 0.4 10*3/uL (ref 0.1–1.0)
Monocytes Relative: 7.9 % (ref 3.0–12.0)
Neutro Abs: 3 10*3/uL (ref 1.4–7.7)
Neutrophils Relative %: 63.7 % (ref 43.0–77.0)
Platelets: 127 10*3/uL — ABNORMAL LOW (ref 150.0–400.0)
RBC: 4.56 Mil/uL (ref 3.87–5.11)
RDW: 13.5 % (ref 11.5–15.5)
WBC: 4.7 10*3/uL (ref 4.0–10.5)

## 2019-12-04 LAB — LIPID PANEL
Cholesterol: 237 mg/dL — ABNORMAL HIGH (ref 0–200)
HDL: 78.8 mg/dL (ref >39.00)
LDL Cholesterol: 130 mg/dL — ABNORMAL HIGH (ref 0–99)
NonHDL: 157.72
Total CHOL/HDL Ratio: 3
Triglycerides: 137 mg/dL (ref 0.0–149.0)
VLDL: 27.4 mg/dL (ref 0.0–40.0)

## 2019-12-04 LAB — BASIC METABOLIC PANEL
BUN: 17 mg/dL (ref 6–23)
CO2: 29 mEq/L (ref 19–32)
Calcium: 9.1 mg/dL (ref 8.4–10.5)
Chloride: 101 mEq/L (ref 96–112)
Creatinine, Ser: 0.78 mg/dL (ref 0.40–1.20)
GFR: 73.43 mL/min (ref >60.00)
Glucose, Bld: 84 mg/dL (ref 70–99)
Potassium: 3.8 mEq/L (ref 3.5–5.1)
Sodium: 136 mEq/L (ref 135–145)

## 2019-12-04 LAB — TSH: TSH: 0.93 u[IU]/mL (ref 0.35–4.50)

## 2019-12-04 NOTE — Patient Instructions (Signed)
Follow up as needed or as scheduled We'll notify you of your lab results and make any changes if needed Call with any questions or concerns GOOD LUCK WITH SURGERY!!!

## 2019-12-04 NOTE — Progress Notes (Signed)
Subjective:    Patient ID: Nicole Benson, female    DOB: Oct 12, 1950, 69 y.o.   MRN: [id number]     Subjective:    Nicole Benson is a 69 y.o. female who presents to the office today for a preoperative consultation at the request of surgeon Dr Noemi Chapel who plans on performing L knee scope on June 23. This consultation is requested for the specific conditions prompting preoperative evaluation (i.e. because of potential affect on operative risk): none. Planned anesthesia: general. The patient has the following known anesthesia issues: no hx of problems. Patients bleeding risk: no recent abnormal bleeding and no remote history of abnormal bleeding. Patient does not have objections to receiving blood products if needed.  The following portions of the patient's history were reviewed and updated as appropriate: allergies, current medications, past family history, past medical history, past social history, past surgical history and problem list.  Review of Systems A comprehensive review of systems was negative.    Objective:    BP 124/68   Pulse (!) 58   Temp 97.9 F (36.6 C) (Tympanic)   Resp 16   Ht 5\' 4"  (1.626 m)   Wt 128 lb 6 oz (58.2 kg)   SpO2 98%   BMI 22.04 kg/m   General Appearance:    Alert, cooperative, no distress, appears stated age  Head:    Normocephalic, without obvious abnormality, atraumatic  Eyes:    PERRL, conjunctiva/corneas clear, EOM's intact, fundi    benign, both eyes  Ears:    Normal TM's and external ear canals, both ears  Nose:   Nares normal, septum midline, mucosa normal, no drainage    or sinus tenderness  Throat:   Lips, mucosa, and tongue normal; teeth and gums normal  Neck:   Supple, symmetrical, trachea midline, no adenopathy;    thyroid:  no enlargement/tenderness/nodules  Back:     Symmetric, no curvature, ROM normal, no CVA tenderness  Lungs:     Clear to auscultation bilaterally, respirations unlabored  Chest Wall:    No tenderness or deformity    Heart:    Regular rate and rhythm, S1 and S2 normal, no murmur, rub   or gallop  Breast Exam:    No tenderness, masses, or nipple abnormality  Abdomen:     Soft, non-tender, bowel sounds active all four quadrants,    no masses, no organomegaly  Genitalia:    deferred  Rectal:    Extremities:   Extremities normal, atraumatic, no cyanosis or edema  Pulses:   2+ and symmetric all extremities  Skin:   Skin color, texture, turgor normal, no rashes or lesions  Lymph nodes:   Cervical, supraclavicular, and axillary nodes normal  Neurologic:   CNII-XII intact, normal strength, sensation and reflexes    throughout    Predictors of intubation difficulty:  Morbid obesity? no  Anatomically abnormal facies? no  Prominent incisors? no  Receding mandible? no  Short, thick neck? no  Neck range of motion: normal  Dentition: No chipped, loose, or missing teeth.  Cardiographics ECG: normal sinus rhythm, no blocks or conduction defects, no ischemic changes Echocardiogram: not done  Imaging Chest x-ray: NA   Lab Review  not applicable    Assessment:      69 y.o. female with planned surgery as above.   Known risk factors for perioperative complications: None   Difficulty with intubation is not anticipated.  Cardiac Risk Estimation: low  Current medications which may produce withdrawal symptoms if withheld  perioperatively: none     Plan:    1. Preoperative workup as follows ECG. 2. Change in medication regimen before surgery: none, continue medication regimen including morning of surgery, with sip of water. 3. Prophylaxis for cardiac events with perioperative beta-blockers: not indicated. 4. Invasive hemodynamic monitoring perioperatively: at the discretion of anesthesiologist. 5. Deep vein thrombosis prophylaxis postoperatively:regimen to be chosen by surgical team. 6. Surveillance for postoperative MI with ECG immediately postoperatively and on postoperative days 1 and 2 AND troponin  levels 24 hours postoperatively and on day 4 or hospital discharge (whichever comes first): at the discretion of anesthesiologist. 7. Other measures: none    This visit occurred during the SARS-CoV-2 public health emergency.  Safety protocols were in place, including screening questions prior to the visit, additional usage of staff PPE, and extensive cleaning of exam room while observing appropriate contact time as indicated for disinfecting solutions.

## 2019-12-05 ENCOUNTER — Other Ambulatory Visit: Payer: Self-pay

## 2019-12-05 ENCOUNTER — Ambulatory Visit (INDEPENDENT_AMBULATORY_CARE_PROVIDER_SITE_OTHER): Payer: PPO | Admitting: Sports Medicine

## 2019-12-05 DIAGNOSIS — M25511 Pain in right shoulder: Secondary | ICD-10-CM | POA: Insufficient documentation

## 2019-12-05 NOTE — Assessment & Plan Note (Signed)
Patient with new onset shoulder pain related to increase shoulder use. Likely a tendinopathy and less likely rotator cuff tear given that patient has good strength. Normally would provide corticosteroid injection however patient does have an arthroscopy scheduled for tomorrow so would interfere with surgery. Advised that after her surgery is done she should follow-up with orthopedics for corticosteroid injection. Can continue nitro patches after surgery as well. Can consider NSAID use as well. Follow-up if no improvement. Advised to continue shoulder exercises as this will help with strength however to avoid them if they are causing pain.

## 2019-12-05 NOTE — Progress Notes (Signed)
Nicole Benson is a 69 y.o. female who presents to [facility] today for the following:  Right shoulder pain Patient presenting for new onset right shoulder pain. States that she initially started to feel pain after she had a left hand injury. On 4/14 she cut off the tip of her left pinky and ever since she has been using her right arm more. Since then she has noticed right shoulder pain. Notes no trauma to the area but did have an injury in the 1980s which she saw Dr. Oneida Benson for. Had the shoulder exercises from that injury and has been doing them. Does notice pain with external rotation and with resisted flexion. States she also notes clicking of her shoulder. Has been using nitro patches which helps but she was not sure if she should continue using that since she has a knee arthroscopy scheduled for tomorrow. Notes no swelling in the area. Did have a tennis elbow brace so she used that for her shoulder and noted some bruising. Denies any further bruising. Denies any numbness, tingling, but weakness.  PMH reviewed. Meniscus injury, subscapularis sprain/strain ROS as above. Medications reviewed.  Exam:  BP 108/78   Ht 5\' 5"  (1.651 m)   Wt 126 lb (57.2 kg)   BMI 20.97 kg/m  Gen: Well NAD MSK: Shoulder: Inspection reveals no obvious deformity, atrophy, or asymmetry. No bruising. No swelling Palpation is normal with no TTP over [facility] joint or bicipital groove. Full ROM in flexion, abduction, internal/external rotation  NV intact distally Normal scapular function observed. Special Tests:  - Impingement: Neg Hawkins, neers, Positive can sign. - Supraspinatous: Positive empty can.  5/5 strength with resisted flexion at 20 degrees - Infraspinatous/Teres Minor: 5/5 strength with ER but with pain  - Subscapularis: 5/5 strength with IR - Biceps tendon: Negative Speeds, Yerrgason's  - Labrum: Negative Obriens  - AC Joint: Negative cross arm - Negative apprehension test - No painful arc and no drop arm  sign    Assessment and Plan: 1) Shoulder pain, right Patient with new onset shoulder pain related to increase shoulder use. Likely a tendinopathy and less likely rotator cuff tear given that patient has good strength. Normally would provide corticosteroid injection however patient does have an arthroscopy scheduled for tomorrow so would interfere with surgery. Advised that after her surgery is done she should follow-up with orthopedics for corticosteroid injection. Can continue nitro patches after surgery as well. Can consider NSAID use as well. Follow-up if no improvement. Advised to continue shoulder exercises as this will help with strength however to avoid them if they are causing pain.   Nicole Benson, PGY-3 [facility] Resident  12/05/2019 3:51 PM  Patient seen and evaluated with the resident.  I agree with the above plan of care.  Physical exam and history suggest rotator cuff tendinopathy.  She has a home exercise program which she has been attempting to do on her own but has some pain with resisted external rotation and forward flexion.  I recommended that she try these with a lower resistance band.  She is scheduled for knee arthroscopy tomorrow so we will not proceed with cortisone injection today.  However, I did recommend that she mention this to Dr. Noemi Benson at her 1 week postop follow-up.  He may be willing to do a subacromial cortisone injection at that time and she will need to continue with her home exercises.  If she fails to improve after subacromial cortisone injection then we will consider  imaging at that time.  Follow-up as needed.

## 2019-12-06 DIAGNOSIS — G8918 Other acute postprocedural pain: Secondary | ICD-10-CM | POA: Diagnosis not present

## 2019-12-06 DIAGNOSIS — S83242A Other tear of medial meniscus, current injury, left knee, initial encounter: Secondary | ICD-10-CM | POA: Diagnosis not present

## 2019-12-06 DIAGNOSIS — M94262 Chondromalacia, left knee: Secondary | ICD-10-CM | POA: Diagnosis not present

## 2019-12-06 DIAGNOSIS — S83282A Other tear of lateral meniscus, current injury, left knee, initial encounter: Secondary | ICD-10-CM | POA: Diagnosis not present

## 2019-12-06 DIAGNOSIS — S83232A Complex tear of medial meniscus, current injury, left knee, initial encounter: Secondary | ICD-10-CM | POA: Diagnosis not present

## 2019-12-06 DIAGNOSIS — X58XXXA Exposure to other specified factors, initial encounter: Secondary | ICD-10-CM | POA: Diagnosis not present

## 2019-12-06 DIAGNOSIS — X503XXA Overexertion from repetitive movements, initial encounter: Secondary | ICD-10-CM | POA: Diagnosis not present

## 2019-12-06 HISTORY — PX: KNEE ARTHROSCOPY: SUR90

## 2019-12-12 ENCOUNTER — Encounter: Payer: Self-pay | Admitting: Gastroenterology

## 2019-12-12 ENCOUNTER — Ambulatory Visit: Payer: PPO | Admitting: Gastroenterology

## 2019-12-12 VITALS — BP 110/80 | HR 60 | Ht 63.5 in | Wt 127.4 lb

## 2019-12-12 DIAGNOSIS — K625 Hemorrhage of anus and rectum: Secondary | ICD-10-CM | POA: Diagnosis not present

## 2019-12-12 DIAGNOSIS — R194 Change in bowel habit: Secondary | ICD-10-CM

## 2019-12-12 NOTE — Progress Notes (Signed)
[facility] Consult Note:  History: Nicole Benson 12/12/2019  Referring provider: Midge Minium, MD  Reason for consult/chief complaint: Hemorrhoids (pt ? if this is due to bicycle saddle and riding bike) and Rectal Bleeding (intermittent, none in 2 weeks ago)   Subjective  HPI:  [name] saw me today for intermittent rectal bleeding that is occurred during episodic constipation over the last couple of years.  She wondered if it might be due to being an avid cyclist, which is something she has done less of in recent months with a knee problem.  She had a laparoscopy on the left knee last week, hopes to recover soon and get back to cycling. Sometimes she will go 2 days without a BM, will need to sit on the toilet for a while and strain, and then may have some bleeding.  Sometimes when that occurs there might also be some perianal itching. When the bleeding occurs, it is painless. Screening colon with me Sept 2018 -no hemorrhoids seen.  ROS:  Review of Systems  Constitutional: Negative for appetite change and unexpected weight change.  HENT: Negative for mouth sores and voice change.   Eyes: Negative for pain and redness.  Respiratory: Negative for cough and shortness of breath.   Cardiovascular: Negative for chest pain and palpitations.  Genitourinary: Negative for dysuria and hematuria.  Musculoskeletal: Negative for arthralgias and myalgias.       Left knee pain  Skin: Negative for pallor and rash.  Allergic/Immunologic: Positive for environmental allergies.  Neurological: Negative for weakness and headaches.  Hematological: Negative for adenopathy.     Past Medical History: Past Medical History:  Diagnosis Date   Chronic lower back pain    Heart murmur      Past Surgical History: Past Surgical History:  Procedure Laterality Date   ABLATION     Radio frequency lower back   COLONOSCOPY     COLONOSCOPY W/ BIOPSIES     hyperplastic polyp     KNEE ARTHROSCOPY Left 12/06/2019   KNEE ARTHROSCOPY Right 2016   POLYPECTOMY       Family History: Family History  Problem Relation Age of Onset   Hypertension Mother    Throat cancer Father    Bone cancer Maternal Grandmother    Colon cancer Maternal Grandfather        ? early 21's   Esophageal cancer Neg Hx    Pancreatic cancer Neg Hx    Rectal cancer Neg Hx    Stomach cancer Neg Hx     Social History: Social History   Socioeconomic History   Marital status: Married    Spouse name: Not on file   Number of children: 1   Years of education: Not on file   Highest education level: Not on file  Occupational History   Occupation: botanist  Tobacco Use   Smoking status: Never Smoker   Smokeless tobacco: Never Used  Scientific laboratory technician Use: Never used  Substance and Sexual Activity   Alcohol use: Yes    Alcohol/week: 7.0 standard drinks    Types: 7 Glasses of wine per week    Comment: 4 drinks a week   Drug use: No   Sexual activity: Yes    Partners: Male  Other Topics Concern   Not on file  Social History Narrative   Married. Education: [employer]. Exercise: Yes.   Social Determinants of Health   Financial Resource Strain:    Difficulty of Paying Living Expenses:  Food Insecurity:    Worried About Charity fundraiser in the Last Year:    Arboriculturist in the Last Year:   Transportation Needs:    Film/video editor (Medical):    Lack of Transportation (Non-Medical):   Physical Activity:    Days of Exercise per Week:    Minutes of Exercise per Session:   Stress:    Feeling of Stress :   Social Connections:    Frequency of Communication with Friends and Family:    Frequency of Social Gatherings with Friends and Family:    Attends Religious Services:    Active Member of Clubs or Organizations:    Attends Archivist Meetings:    Marital Status:     Allergies: Allergies  Allergen Reactions    Sulfonamide Derivatives Rash    Outpatient Meds: Current Outpatient Medications  Medication Sig Dispense Refill   Calcium Carbonate-Vitamin D (CALCIUM 500 + D PO) Take by mouth daily.     predniSONE (STERAPRED UNI-PAK 21 TAB) 10 MG (21) TBPK tablet Take by mouth as directed.     Red Yeast Rice Extract (RED YEAST RICE PO) Take by mouth.     acetaminophen (TYLENOL) 650 MG CR tablet Take 650 mg by mouth as needed.  (Patient not taking: Reported on 12/12/2019)     levocetirizine (XYZAL) 5 MG tablet Take 5 mg by mouth every evening. (Patient not taking: Reported on 12/12/2019)     No current facility-administered medications for this visit.      ___________________________________________________________________ Objective   Exam:  BP 110/80 (BP Location: Left Arm, Patient Position: Sitting, Cuff Size: Normal)    Pulse 60    Ht 5' 3.5" (1.613 m) Comment: height measured without shoes   Wt 127 lb 6 oz (57.8 kg)    BMI 22.21 kg/m  Exam chaperoned by Magdalene River, MA  General: Well-appearing  Eyes: sclera anicteric, no redness  ENT: oral mucosa moist without lesions, no cervical or supraclavicular lymphadenopathy  CV: RRR without murmur, S1/S2, no JVD, no peripheral edema  Resp: clear to auscultation bilaterally, normal RR and effort noted  GI: soft, no tenderness, with active bowel sounds. No guarding or palpable organomegaly noted.  Skin; warm and dry, no rash or jaundice noted  Neuro: awake, alert and oriented x 3. Normal gross motor function and fluent speech Rectal: Normal external, no prolapsed tissue.  DRE: No fissure or tenderness, no palpable internal lesion.  Firm stool rectal vault. Anoscopy: Normal-appearing internal hemorrhoid columns, not inflamed or prolapsed. Labs:  CBC Latest Ref Rng & Units 12/04/2019 02/14/2019 04/29/2017  WBC 4.0 - 10.5 K/uL 4.7 4.9 4.3  Hemoglobin 12.0 - 15.0 g/dL 13.8 14.0 14.5  Hematocrit 36 - 46 % 41.5 42.4 44.4  Platelets 150 - 400 K/uL  127.0(L) 123.0(L) 132.0(L)      Assessment: Encounter Diagnoses  Name Primary?   Rectal bleeding Yes   Altered bowel habits     Benign anal bleeding from episodic constipation.  Plan:  Recommended as needed use of stool softener when constipated, can escalate to low-dose MiraLAX if needed. No enlarged or prolapse hemorrhoidal tissue to require banding. See me as needed.  Thank you for the courtesy of this consult.  Please call me with any questions or concerns.  Nelida Meuse III  CC: Referring provider noted above

## 2019-12-12 NOTE — Patient Instructions (Signed)
If you are age 68 or older, your body mass index should be between 23-30. Your Body mass index is 22.21 kg/m. If this is out of the aforementioned range listed, please consider follow up with your Primary Care Provider.  If you are age 75 or younger, your body mass index should be between 19-25. Your Body mass index is 22.21 kg/m. If this is out of the aformentioned range listed, please consider follow up with your Primary Care Provider.   It was a pleasure to see you today!  Dr. Loletha Carrow

## 2020-01-02 ENCOUNTER — Other Ambulatory Visit: Payer: Self-pay

## 2020-01-02 ENCOUNTER — Ambulatory Visit: Payer: PPO | Admitting: Sports Medicine

## 2020-01-02 VITALS — BP 148/84 | Ht 63.5 in | Wt 125.0 lb

## 2020-01-02 DIAGNOSIS — M25511 Pain in right shoulder: Secondary | ICD-10-CM

## 2020-01-02 DIAGNOSIS — M2170 Unequal limb length (acquired), unspecified site: Secondary | ICD-10-CM | POA: Diagnosis not present

## 2020-01-02 MED ORDER — NITROGLYCERIN 0.2 MG/HR TD PT24: MEDICATED_PATCH | TRANSDERMAL | 1 refills | Status: DC

## 2020-01-02 NOTE — Progress Notes (Signed)
SUBJECTIVE:   CHIEF COMPLAINT / HPI:   New orthotics Nicole Benson is a 69 year old female who presents today for new orthotics.  Her current ones worked well in her running shoes however she is looking for a new pair that she can place in her sandals.  She has no foot pain or complaints while walking.  She states that her old arch orthotics are still working well however they do not fit quite as well in her sandals thus she is requesting a new pair made specifically for the sandals.  Orthotics have helped with her gait issues and with her leg length inequality so that she does not get pain after hiking or running Right shoulder pain Patient also is having right shoulder pain with shoulder flexion and abduction above 90 degrees.  She has had rotator cuff injury in the past and states this pain feels similar to what she had when she had injured her rotator cuff.  Found some old instructions on rotator cuff rehab and started performing the exercises on her own.  As she did this initially she found some relief however after continuing to do them her pain feels like it is now increased.  PERTINENT  PMH / PSH: Leg length discrepancy, right shoulder pain  OBJECTIVE:   BP (!) 148/84   Ht 5' 3.5" (1.613 m)   Wt 125 lb (56.7 kg)   BMI 21.80 kg/m    MSK Ankle/Foot, Right: No visible erythema, swelling, ecchymosis, or bony deformity. Notable pes planus/cavus deformity. Transverse arch grossly intact;  Evidence of tibiotalar deviation favoring supination without orthotics; Range of motion is full in all directions. Strength is 5/5 in all directions. No tenderness at the insertion/body/myotendinous junction of the Achilles tendon; No peroneal tendon tenderness or subluxation;  Ankle/Foot, Left: No visible erythema, swelling, ecchymosis, or bony deformity. Notable pes planus deformity. Transverse arch grossly intact; Evidence of tibiotalar deviation without orthotics; Range of motion is full in all  directions. Strength is 5/5 in all directions. No tenderness at the insertion/body/myotendinous junction of the Achilles tendon; No peroneal tendon tenderness or subluxation Right Shoulder Shoulder, Right: No evidence of bony deformity, asymmetry, or muscle atrophy; No tenderness over long head of biceps (bicipital groove). No TTP at [facility] joint. Limited active and passive range of motion in flexion and abduction, Strength 5/5 throughout.  Sensation intact. Peripheral pulses intact. Special Tests:   - Crossarm test: NEG - Jobe test: NEG   - Hawkins: NEG   - Belly press test: NEG   - Speeds test: NEG      ASSESSMENT/PLAN:   Leg length discrepancy New orthotics made specifically for her sandals. Instead of the typical blue padding we used heel wedges as it would be thinner and fit easier in the sandals. Heel lift was placed on the new custom orthotic as well to balance pelvic position.  Right shoulder pain Given her pain on range of motion with internal rotation, flexion, and abduction she most likely has subscap tendinopathy and possibly infraspinatous involvement.  - Stop exercises she was previously doing and start simple shoulder rehab series with tension band. - Follow up in 6 weeks - Cont Nitro patches     Patient was fitted for a : standard, cushioned, semi-rigid orthotic. The orthotic was heated and afterward the patient stood on the orthotic blank positioned on the orthotic stand. The patient was positioned in subtalar neutral position and 10 degrees of ankle dorsiflexion in a weight bearing stance. After completion of  molding, a stable base was applied to the orthotic blank. The blank was ground to a stable position for weight bearing. Size: 7 Base: Therapist, music and EVA black: Wedges and heel lift with blue foam rubber The patient ambulated these, and they were very comfortable.  I spent 40 minutes with this patient, greater than 50% was face-to-face time  counseling regarding the diagnosis, treatment options, pathophysiology and prognosis of the diagnoses listed below.    [name], DO [place]   I observed and examined the patient with the Fellow and agree with assessment and plan.  Note reviewed and modified by me. [name], MD

## 2020-01-02 NOTE — Assessment & Plan Note (Signed)
Given her pain on range of motion with internal rotation, flexion, and abduction she most likely has subscap tendinopathy and possibly infraspinatous involvement.  - Stop exercises she was previously doing and start simple shoulder rehab series with tension band. - Follow up in 6 weeks - Cont Nitro patches

## 2020-01-02 NOTE — Assessment & Plan Note (Signed)
New orthotics made specifically for her sandals. Instead of the typical blue padding we used heel wedges as it would be thinner and fit easier in the sandals. Heel lift was placed on the new custom orthotic as well to balance pelvic position.

## 2020-01-09 DIAGNOSIS — Z01419 Encounter for gynecological examination (general) (routine) without abnormal findings: Secondary | ICD-10-CM | POA: Diagnosis not present

## 2020-01-24 ENCOUNTER — Telehealth: Payer: Self-pay | Admitting: Family Medicine

## 2020-01-24 NOTE — Telephone Encounter (Signed)
LVM to r/s appt on 02/20/20 due to [facility] not in office on Tuesday starting Sept 2021

## 2020-01-29 DIAGNOSIS — H1131 Conjunctival hemorrhage, right eye: Secondary | ICD-10-CM | POA: Diagnosis not present

## 2020-02-20 ENCOUNTER — Ambulatory Visit: Payer: PPO

## 2020-02-21 ENCOUNTER — Other Ambulatory Visit: Payer: Self-pay

## 2020-02-21 DIAGNOSIS — M25511 Pain in right shoulder: Secondary | ICD-10-CM

## 2020-02-22 ENCOUNTER — Ambulatory Visit
Admission: RE | Admit: 2020-02-22 | Discharge: 2020-02-22 | Disposition: A | Discharge status: Home / Self Care | Payer: PPO | Source: Ambulatory Visit | Attending: Sports Medicine | Admitting: Sports Medicine

## 2020-02-22 DIAGNOSIS — M19011 Primary osteoarthritis, right shoulder: Secondary | ICD-10-CM | POA: Diagnosis not present

## 2020-02-22 DIAGNOSIS — M25511 Pain in right shoulder: Secondary | ICD-10-CM

## 2020-03-04 ENCOUNTER — Ambulatory Visit (INDEPENDENT_AMBULATORY_CARE_PROVIDER_SITE_OTHER): Payer: PPO

## 2020-03-04 VITALS — Ht 64.0 in | Wt 125.0 lb

## 2020-03-04 DIAGNOSIS — Z0000 Encounter for general adult medical examination without abnormal findings: Secondary | ICD-10-CM | POA: Diagnosis not present

## 2020-03-04 NOTE — Progress Notes (Signed)
Subjective:   Nicole Benson is a 69 y.o. female who presents for Medicare Annual (Subsequent) preventive examination.  I connected with Kanaya today by telephone and verified that I am speaking with the correct person using two identifiers. Location patient: home Location provider: work Persons participating in the virtual visit: patient, Marine scientist.    I discussed the limitations, risks, security and privacy concerns of performing an evaluation and management service by telephone and the availability of in person appointments. I also discussed with the patient that there may be a patient responsible charge related to this service. The patient expressed understanding and verbally consented to this telephonic visit.    Interactive audio and video telecommunications were attempted between this provider and patient, however failed, due to patient having technical difficulties OR patient did not have access to video capability.  We continued and completed visit with audio only.  Some vital signs may be absent or patient reported.   Time Spent with patient on telephone encounter: 20 minutes  Review of Systems     Cardiac Risk Factors include: advanced age (>66men, >92 women);dyslipidemia     Objective:    Today's Vitals   03/04/20 0814  Weight: 125 lb (56.7 kg)  Height: 5\' 4"  (1.626 m)   Body mass index is 21.46 kg/m.  Advanced Directives 03/04/2020 09/27/2019 05/27/2018 02/04/2017 09/16/2015 08/06/2014  Does Patient Have a Medical Advance Directive? Yes Yes No Yes Yes Yes  Type of Paramedic of [place];Living will [place];Living will - [place];Living will [place];Living will Living will;Healthcare Power of [place] in Chart? No - copy requested - - No - copy requested - -  Would patient like information on creating a medical advance directive? - - No - Patient declined  - - -    Current Medications (verified) Outpatient Encounter Medications as of 03/04/2020  Medication Sig  . Calcium Carbonate-Vitamin D (CALCIUM 500 + D PO) Take by mouth daily.  . meloxicam (MOBIC) 15 MG tablet meloxicam 15 mg tablet  TAKE 1 TABLET BY MOUTH EVERY DAY  . nitroGLYCERIN (NITRODUR - DOSED IN MG/24 HR) 0.2 mg/hr patch Place 1/4 to 1/2 of a patch over affected region. Remove and replace once daily.  Slightly alter skin placement daily  . Red Yeast Rice Extract (RED YEAST RICE PO) Take by mouth.  Marland Kitchen XIIDRA 5 % SOLN INSTILL 1 DROP IN BOTH EYES TWICE DAILY AS DIRECTED  . [DISCONTINUED] predniSONE (STERAPRED UNI-PAK 21 TAB) 10 MG (21) TBPK tablet Take by mouth as directed.   No facility-administered encounter medications on file as of 03/04/2020.    Allergies (verified) Sulfonamide derivatives   History: Past Medical History:  Diagnosis Date  . Chronic lower back pain   . Heart murmur    Past Surgical History:  Procedure Laterality Date  . ABLATION     Radio frequency lower back  . COLONOSCOPY    . COLONOSCOPY W/ BIOPSIES     hyperplastic polyp  . KNEE ARTHROSCOPY Left 12/06/2019  . KNEE ARTHROSCOPY Right 2016  . POLYPECTOMY     Family History  Problem Relation Age of Onset  . Hypertension Mother   . Throat cancer Father   . Bone cancer Maternal Grandmother   . Colon cancer Maternal Grandfather        ? early 3's  . Esophageal cancer Neg Hx   . Pancreatic cancer Neg Hx   .  Rectal cancer Neg Hx   . Stomach cancer Neg Hx    Social History   Socioeconomic History  . Marital status: Married    Spouse name: Not on file  . Number of children: 1  . Years of education: Not on file  . Highest education level: Not on file  Occupational History  . Occupation: botanist  Tobacco Use  . Smoking status: Never Smoker  . Smokeless tobacco: Never Used  Vaping Use  . Vaping Use: Never used  Substance and Sexual Activity  . Alcohol use: Yes    Alcohol/week: 7.0  standard drinks    Types: 7 Glasses of wine per week    Comment: 4 drinks a week  . Drug use: No  . Sexual activity: Yes    Partners: Male  Other Topics Concern  . Not on file  Social History Narrative   Married. Education: [employer]. Exercise: Yes.   Social Determinants of Health   Financial Resource Strain: Low Risk   . Difficulty of Paying Living Expenses: Not hard at all  Food Insecurity: No Food Insecurity  . Worried About Charity fundraiser in the Last Year: Never true  . Ran Out of Food in the Last Year: Never true  Transportation Needs: No Transportation Needs  . Lack of Transportation (Medical): No  . Lack of Transportation (Non-Medical): No  Physical Activity: Sufficiently Active  . Days of Exercise per Week: 4 days  . Minutes of Exercise per Session: 60 min  Stress: No Stress Concern Present  . Feeling of Stress : Not at all  Social Connections: Moderately Isolated  . Frequency of Communication with Friends and Family: More than three times a week  . Frequency of Social Gatherings with Friends and Family: Once a week  . Attends Religious Services: Never  . Active Member of Clubs or Organizations: No  . Attends Archivist Meetings: Never  . Marital Status: Married    Tobacco Counseling Counseling given: Not Answered   Clinical Intake:  Pre-visit preparation completed: Yes  Pain : No/denies pain     Nutritional Status: BMI of 19-24  Normal Nutritional Risks: None Diabetes: No  How often do you need to have someone help you when you read instructions, pamphlets, or other written materials from your doctor or pharmacy?: 1 - Never What is the last grade level you completed in school?: Master's Degree  Diabetic?No  Interpreter Needed?: No  Information entered by :: Caroleen Hamman LPN   Activities of Daily Living In your present state of health, do you have any difficulty performing the following activities: 03/04/2020 12/04/2019  Hearing? N N   Vision? N N  Difficulty concentrating or making decisions? N N  Walking or climbing stairs? N N  Dressing or bathing? N N  Doing errands, shopping? N N  Preparing Food and eating ? N -  Using the Toilet? N -  In the past six months, have you accidently leaked urine? N -  Do you have problems with loss of bowel control? N -  Managing your Medications? N -  Managing your Finances? N -  Housekeeping or managing your Housekeeping? N -  Some recent data might be hidden    Patient Care Team: Midge Minium, MD as PCP - [facility] ([facility], [facility] as Audiologist (Audiology)  Indicate any recent Medical Services you may have received from other than [facility] providers in the past year (date may be approximate).  Assessment:   This is a routine wellness examination for Taira.  Hearing/Vision screen  Hearing Screening   125Hz  250Hz  500Hz  1000Hz  2000Hz  3000Hz  4000Hz  6000Hz  8000Hz   Right ear:           Left ear:           Comments: No issues  Vision Screening Comments: Wears glasses Last eye exam-10/2019-Dr. Sabra Heck  Dietary issues and exercise activities discussed: Current Exercise Habits: Home exercise routine, Type of exercise: yoga (bike riding), Time (Minutes): 60, Frequency (Times/Week): 4, Weekly Exercise (Minutes/Week): 240, Intensity: Mild  Goals    . Patient Stated     Maintain current health      Depression Screen PHQ 2/9 Scores 03/04/2020 12/04/2019 02/14/2019 06/30/2018 04/29/2017 12/10/2016 09/16/2015  PHQ - 2 Score 0 0 0 0 0 0 0  PHQ- 9 Score - 0 0 0 0 0 -    Fall Risk Fall Risk  03/04/2020 12/04/2019 02/14/2019 06/30/2018 04/29/2017  Falls in the past year? 0 0 0 0 No  Number falls in past yr: 0 0 0 - -  Injury with Fall? 0 0 0 - -  Follow up Falls prevention discussed Falls evaluation completed - - -    Any stairs in or around the home? No  Home free of loose throw rugs in walkways, pet beds, electrical cords, etc? Yes    Adequate lighting in your home to reduce risk of falls? Yes   ASSISTIVE DEVICES UTILIZED TO PREVENT FALLS:  Life alert? No  Use of a cane, walker or w/c? No  Grab bars in the bathroom? No  Shower chair or bench in shower? No  Elevated toilet seat or a handicapped toilet? No   TIMED UP AND GO:  Was the test performed? No . Phone visit   Cognitive Function:No cognitive impairment noted        Immunizations Immunization History  Administered Date(s) Administered  . Fluad Quad(high Dose 65+) 02/14/2019  . Influenza, High Dose Seasonal PF 04/05/2018  . Influenza,inj,Quad PF,6+ Mos 04/29/2017  . Influenza-Unspecified 03/05/2015  . Moderna SARS-COVID-2 Vaccination 07/14/2019, 08/14/2019  . Pneumococcal Conjugate-13 12/10/2016  . Pneumococcal Polysaccharide-23 02/14/2019  . Td 09/27/2019  . Tdap 02/05/2012  . Zoster 07/18/2012    TDAP status: Up to date   Flu Vaccine status: Declined, Education has been provided regarding the importance of this vaccine but patient still declined. Advised may receive this vaccine at local pharmacy or Health Dept. Aware to provide a copy of the vaccination record if obtained from local pharmacy or Health Dept. Verbalized acceptance and understanding. Phone visit  Pneumococcal vaccine status: Up to date   Covid-19 vaccine status: Completed vaccines  Qualifies for Shingles Vaccine? Yes   Zostavax completed Yes   Shingrix Completed?: No.    Education has been provided regarding the importance of this vaccine. Patient has been advised to call insurance company to determine out of pocket expense if they have not yet received this vaccine. Advised may also receive vaccine at local pharmacy or Health Dept. Verbalized acceptance and understanding.  Screening Tests Health Maintenance  Topic Date Due  . INFLUENZA VACCINE  01/14/2020  . MAMMOGRAM  04/07/2020  . COLONOSCOPY  02/19/2027  . TETANUS/TDAP  09/26/2029  . DEXA SCAN  Completed  . COVID-19  Vaccine  Completed  . Hepatitis C Screening  Completed  . PNA vac Low Risk Adult  Completed    Health Maintenance  Health Maintenance Due  Topic Date Due  . INFLUENZA  VACCINE  01/14/2020    Colorectal cancer screening: Completed 02/18/2017. Repeat every 10 years   Mammogram status: Completed 2020 at GYN office. Repeat every year   Bone Density status: Completed 03/29/2017. Results reflect: Bone density results: OSTEOPENIA. Repeat every 2 years. Patient plans to discuss with GYN & have scheduled with next mammogram.  Lung Cancer Screening: (Low Dose CT Chest recommended if Age 25-80 years, 30 pack-year currently smoking OR have quit w/in 15years.) does not qualify.     Additional Screening:  Hepatitis C Screening:  Completed 01/02/2015  Vision Screening: Recommended annual ophthalmology exams for early detection of glaucoma and other disorders of the eye. Is the patient up to date with their annual eye exam?  Yes  Who is the provider or what is the name of the office in which the patient attends annual eye exams? Dr. Sabra Heck  Dental Screening: Recommended annual dental exams for proper oral hygiene  Community Resource Referral / Chronic Care Management: CRR required this visit?  No   CCM required this visit?  No      Plan:     I have personally reviewed and noted the following in the patient's chart:   . Medical and social history . Use of alcohol, tobacco or illicit drugs  . Current medications and supplements . Functional ability and status . Nutritional status . Physical activity . Advanced directives . List of other physicians . Hospitalizations, surgeries, and ER visits in previous 12 months . Vitals . Screenings to include cognitive, depression, and falls . Referrals and appointments  In addition, I have reviewed and discussed with patient certain preventive protocols, quality metrics, and best practice recommendations. A written personalized care plan for  preventive services as well as general preventive health recommendations were provided to patient.  Due to this being a telephonic visit, the after visit summary with patients personalized plan was offered to patient via mail or my-chart. Patient would like to access on my-chart.     Marta Antu, LPN   [id number]  Nurse Health Advisor  Nurse Notes: None

## 2020-03-04 NOTE — Patient Instructions (Signed)
Nicole Benson , Thank you for taking time to complete your Medicare Wellness Visit. I appreciate your ongoing commitment to your health goals. Please review the following plan we discussed and let me know if I can assist you in the future.   Screening recommendations/referrals: Colonoscopy: Completed 02/18/2017-Due 02/19/2027 Mammogram: Per our conversation today, completed in 2020 at GYN office. Bone Density: Completed-03/29/2017-Per our conversation, to be discussed with GYN & scheduled with next mammogram. Recommended yearly ophthalmology/optometry visit for glaucoma screening and checkup Recommended yearly dental visit for hygiene and checkup  Vaccinations: Influenza vaccine: Due- May obtain vaccine form our office or at your local pharmacy. Pneumococcal vaccine: Completed vaccines Tdap vaccine: Up to Date Shingles vaccine: Discuss with pharmacy Covid-19:Completed vaccines  Advanced directives: Please bring a copy for your chart.  Conditions/risks identified: See problem list  Next appointment: Follow up in one year for your annual wellness visit    Preventive Care 65 Years and Older, Female Preventive care refers to lifestyle choices and visits with your health care provider that can promote health and wellness. What does preventive care include?  A yearly physical exam. This is also called an annual well check.  Dental exams once or twice a year.  Routine eye exams. Ask your health care provider how often you should have your eyes checked.  Personal lifestyle choices, including:  Daily care of your teeth and gums.  Regular physical activity.  Eating a healthy diet.  Avoiding tobacco and drug use.  Limiting alcohol use.  Practicing safe sex.  Taking low-dose aspirin every day.  Taking vitamin and mineral supplements as recommended by your health care provider. What happens during an annual well check? The services and screenings done by your health care provider  during your annual well check will depend on your age, overall health, lifestyle risk factors, and family history of disease. Counseling  Your health care provider may ask you questions about your:  Alcohol use.  Tobacco use.  Drug use.  Emotional well-being.  Home and relationship well-being.  Sexual activity.  Eating habits.  History of falls.  Memory and ability to understand (cognition).  Work and work Statistician.  Reproductive health. Screening  You may have the following tests or measurements:  Height, weight, and BMI.  Blood pressure.  Lipid and cholesterol levels. These may be checked every 5 years, or more frequently if you are over 51 years old.  Skin check.  Lung cancer screening. You may have this screening every year starting at age 72 if you have a 30-pack-year history of smoking and currently smoke or have quit within the past 15 years.  Fecal occult blood test (FOBT) of the stool. You may have this test every year starting at age 35.  Flexible sigmoidoscopy or colonoscopy. You may have a sigmoidoscopy every 5 years or a colonoscopy every 10 years starting at age 35.  Hepatitis C blood test.  Hepatitis B blood test.  Sexually transmitted disease (STD) testing.  Diabetes screening. This is done by checking your blood sugar (glucose) after you have not eaten for a while (fasting). You may have this done every 1-3 years.  Bone density scan. This is done to screen for osteoporosis. You may have this done starting at age 29.  Mammogram. This may be done every 1-2 years. Talk to your health care provider about how often you should have regular mammograms. Talk with your health care provider about your test results, treatment options, and if necessary, the need for more  tests. Vaccines  Your health care provider may recommend certain vaccines, such as:  Influenza vaccine. This is recommended every year.  Tetanus, diphtheria, and acellular pertussis  (Tdap, Td) vaccine. You may need a Td booster every 10 years.  Zoster vaccine. You may need this after age 53.  Pneumococcal 13-valent conjugate (PCV13) vaccine. One dose is recommended after age 31.  Pneumococcal polysaccharide (PPSV23) vaccine. One dose is recommended after age 59. Talk to your health care provider about which screenings and vaccines you need and how often you need them. This information is not intended to replace advice given to you by your health care provider. Make sure you discuss any questions you have with your health care provider. Document Released: 06/28/2015 Document Revised: 02/19/2016 Document Reviewed: 04/02/2015 Elsevier Interactive Patient Education  2017 [place] Prevention in the Home Falls can cause injuries. They can happen to people of all ages. There are many things you can do to make your home safe and to help prevent falls. What can I do on the outside of my home?  Regularly fix the edges of walkways and driveways and fix any cracks.  Remove anything that might make you trip as you walk through a door, such as a raised step or threshold.  Trim any bushes or trees on the path to your home.  Use bright outdoor lighting.  Clear any walking paths of anything that might make someone trip, such as rocks or tools.  Regularly check to see if handrails are loose or broken. Make sure that both sides of any steps have handrails.  Any raised decks and porches should have guardrails on the edges.  Have any leaves, snow, or ice cleared regularly.  Use sand or salt on walking paths during winter.  Clean up any spills in your garage right away. This includes oil or grease spills. What can I do in the bathroom?  Use night lights.  Install grab bars by the toilet and in the tub and shower. Do not use towel bars as grab bars.  Use non-skid mats or decals in the tub or shower.  If you need to sit down in the shower, use a plastic, non-slip  stool.  Keep the floor dry. Clean up any water that spills on the floor as soon as it happens.  Remove soap buildup in the tub or shower regularly.  Attach bath mats securely with double-sided non-slip rug tape.  Do not have throw rugs and other things on the floor that can make you trip. What can I do in the bedroom?  Use night lights.  Make sure that you have a light by your bed that is easy to reach.  Do not use any sheets or blankets that are too big for your bed. They should not hang down onto the floor.  Have a firm chair that has side arms. You can use this for support while you get dressed.  Do not have throw rugs and other things on the floor that can make you trip. What can I do in the kitchen?  Clean up any spills right away.  Avoid walking on wet floors.  Keep items that you use a lot in easy-to-reach places.  If you need to reach something above you, use a strong step stool that has a grab bar.  Keep electrical cords out of the way.  Do not use floor polish or wax that makes floors slippery. If you must use wax, use non-skid floor  wax.  Do not have throw rugs and other things on the floor that can make you trip. What can I do with my stairs?  Do not leave any items on the stairs.  Make sure that there are handrails on both sides of the stairs and use them. Fix handrails that are broken or loose. Make sure that handrails are as long as the stairways.  Check any carpeting to make sure that it is firmly attached to the stairs. Fix any carpet that is loose or worn.  Avoid having throw rugs at the top or bottom of the stairs. If you do have throw rugs, attach them to the floor with carpet tape.  Make sure that you have a light switch at the top of the stairs and the bottom of the stairs. If you do not have them, ask someone to add them for you. What else can I do to help prevent falls?  Wear shoes that:  Do not have high heels.  Have rubber bottoms.  Are  comfortable and fit you well.  Are closed at the toe. Do not wear sandals.  If you use a stepladder:  Make sure that it is fully opened. Do not climb a closed stepladder.  Make sure that both sides of the stepladder are locked into place.  Ask someone to hold it for you, if possible.  Clearly mark and make sure that you can see:  Any grab bars or handrails.  First and last steps.  Where the edge of each step is.  Use tools that help you move around (mobility aids) if they are needed. These include:  Canes.  Walkers.  Scooters.  Crutches.  Turn on the lights when you go into a dark area. Replace any light bulbs as soon as they burn out.  Set up your furniture so you have a clear path. Avoid moving your furniture around.  If any of your floors are uneven, fix them.  If there are any pets around you, be aware of where they are.  Review your medicines with your doctor. Some medicines can make you feel dizzy. This can increase your chance of falling. Ask your doctor what other things that you can do to help prevent falls. This information is not intended to replace advice given to you by your health care provider. Make sure you discuss any questions you have with your health care provider. Document Released: 03/28/2009 Document Revised: 11/07/2015 Document Reviewed: 07/06/2014 Elsevier Interactive Patient Education  2017 [employer].

## 2020-03-11 ENCOUNTER — Telehealth: Payer: Self-pay | Admitting: Family Medicine

## 2020-03-11 DIAGNOSIS — Z780 Asymptomatic menopausal state: Secondary | ICD-10-CM

## 2020-03-11 NOTE — Telephone Encounter (Signed)
Patient called and said that she would like a bone density ordered.  Her last bone density was in 2018.

## 2020-03-11 NOTE — Addendum Note (Signed)
Addended by: Davis Gourd on: 03/11/2020 02:17 PM   Modules accepted: Orders

## 2020-03-11 NOTE — Telephone Encounter (Signed)
This was ordered today.

## 2020-03-14 ENCOUNTER — Other Ambulatory Visit: Payer: Self-pay

## 2020-03-14 ENCOUNTER — Ambulatory Visit
Admission: RE | Admit: 2020-03-14 | Discharge: 2020-03-14 | Disposition: A | Discharge status: Home / Self Care | Payer: PPO | Source: Ambulatory Visit | Attending: Sports Medicine | Admitting: Sports Medicine

## 2020-03-14 DIAGNOSIS — M75111 Incomplete rotator cuff tear or rupture of right shoulder, not specified as traumatic: Secondary | ICD-10-CM | POA: Diagnosis not present

## 2020-03-14 DIAGNOSIS — M25511 Pain in right shoulder: Secondary | ICD-10-CM

## 2020-03-14 DIAGNOSIS — M75121 Complete rotator cuff tear or rupture of right shoulder, not specified as traumatic: Secondary | ICD-10-CM | POA: Diagnosis not present

## 2020-03-14 DIAGNOSIS — M19011 Primary osteoarthritis, right shoulder: Secondary | ICD-10-CM | POA: Diagnosis not present

## 2020-03-14 DIAGNOSIS — S43401A Unspecified sprain of right shoulder joint, initial encounter: Secondary | ICD-10-CM | POA: Diagnosis not present

## 2020-03-15 ENCOUNTER — Telehealth: Payer: PPO | Admitting: Family Medicine

## 2020-03-15 ENCOUNTER — Encounter: Payer: Self-pay | Admitting: General Practice

## 2020-03-18 ENCOUNTER — Telehealth: Payer: Self-pay | Admitting: Sports Medicine

## 2020-03-18 NOTE — Telephone Encounter (Signed)
  I spoke with Nicole Benson on the phone today after reviewing MRI findings of her right shoulder.  She has a small full-thickness retracted tear of the subscapularis with associated medial dislocation of the long head of the biceps tendon.  She also has a probable degenerative labral tear and moderate glenohumeral OA.  Based on this constellation of findings, I recommend consultation with orthopedics.  She is a well-established patient at [facility] so she will reach out to Dr. Noemi Chapel first.  She will follow up with me as needed.

## 2020-03-19 DIAGNOSIS — S46011A Strain of muscle(s) and tendon(s) of the rotator cuff of right shoulder, initial encounter: Secondary | ICD-10-CM | POA: Diagnosis not present

## 2020-03-20 DIAGNOSIS — S46011D Strain of muscle(s) and tendon(s) of the rotator cuff of right shoulder, subsequent encounter: Secondary | ICD-10-CM | POA: Diagnosis not present

## 2020-03-20 DIAGNOSIS — M25511 Pain in right shoulder: Secondary | ICD-10-CM | POA: Diagnosis not present

## 2020-03-20 DIAGNOSIS — M6281 Muscle weakness (generalized): Secondary | ICD-10-CM | POA: Diagnosis not present

## 2020-04-03 DIAGNOSIS — S46011D Strain of muscle(s) and tendon(s) of the rotator cuff of right shoulder, subsequent encounter: Secondary | ICD-10-CM | POA: Diagnosis not present

## 2020-04-03 DIAGNOSIS — M25511 Pain in right shoulder: Secondary | ICD-10-CM | POA: Diagnosis not present

## 2020-04-03 DIAGNOSIS — M6281 Muscle weakness (generalized): Secondary | ICD-10-CM | POA: Diagnosis not present

## 2020-06-25 DIAGNOSIS — Z1231 Encounter for screening mammogram for malignant neoplasm of breast: Secondary | ICD-10-CM | POA: Diagnosis not present

## 2020-07-05 ENCOUNTER — Other Ambulatory Visit: Payer: Self-pay | Admitting: Family Medicine

## 2020-07-05 DIAGNOSIS — E2839 Other primary ovarian failure: Secondary | ICD-10-CM

## 2020-07-10 ENCOUNTER — Other Ambulatory Visit: Payer: PPO

## 2020-07-11 ENCOUNTER — Ambulatory Visit: Payer: PPO | Admitting: Physician Assistant

## 2020-07-15 ENCOUNTER — Ambulatory Visit: Payer: PPO | Admitting: Family Medicine

## 2020-07-17 ENCOUNTER — Ambulatory Visit
Admission: RE | Admit: 2020-07-17 | Discharge: 2020-07-17 | Disposition: A | Discharge status: Home / Self Care | Payer: PPO | Source: Ambulatory Visit | Attending: Family Medicine | Admitting: Family Medicine

## 2020-07-17 ENCOUNTER — Other Ambulatory Visit: Payer: Self-pay

## 2020-07-17 DIAGNOSIS — E2839 Other primary ovarian failure: Secondary | ICD-10-CM

## 2020-07-17 DIAGNOSIS — Z780 Asymptomatic menopausal state: Secondary | ICD-10-CM | POA: Diagnosis not present

## 2020-07-17 DIAGNOSIS — M8589 Other specified disorders of bone density and structure, multiple sites: Secondary | ICD-10-CM | POA: Diagnosis not present

## 2020-07-26 ENCOUNTER — Ambulatory Visit: Payer: PPO | Admitting: Gastroenterology

## 2020-07-26 ENCOUNTER — Encounter: Payer: Self-pay | Admitting: Gastroenterology

## 2020-07-26 VITALS — BP 120/80 | HR 48 | Ht 64.0 in | Wt 130.0 lb

## 2020-07-26 DIAGNOSIS — R194 Change in bowel habit: Secondary | ICD-10-CM | POA: Insufficient documentation

## 2020-07-26 NOTE — Progress Notes (Signed)
07/26/2020 Nicole Benson [id number] 18-Aug-1950   HISTORY OF PRESENT ILLNESS:  This is a pleasant 70 year old female who is a patient of Dr. Corena Pilgrim with very limited past medical history.  She had a colonoscopy by him in September 2018 that showed redundant colon, but was otherwise normal.  She was here back in June 2021 with complaints of rectal bleeding and rectal exam revealed no source of this.  She tells me that the bleeding has resolved and has no longer been an issue, but she has been having issues with her bowel habits.  She says that initially for several months she was having diarrhea and then realized that it was caffeine related.  She then eliminated all caffeine and the diarrhea has resolved.  Now she describes sensation of incomplete evacuation of stools and sometimes passage of very small amounts or pieces of stool.  She says that she eats a very healthy diet and drinks a lot of fluids, exercises well, etc. It is very bothersome to her having to go back and forth to the bathroom multiple times throughout the day.  She is concerned about a "blockage" of some sort.   Past Medical History:  Diagnosis Date  . Chronic lower back pain   . Heart murmur    Past Surgical History:  Procedure Laterality Date  . ABLATION     Radio frequency lower back  . COLONOSCOPY    . COLONOSCOPY W/ BIOPSIES     hyperplastic polyp  . KNEE ARTHROSCOPY Left 12/06/2019  . KNEE ARTHROSCOPY Right 2016  . POLYPECTOMY      reports that she has never smoked. She has never used smokeless tobacco. She reports current alcohol use of about 7.0 standard drinks of alcohol per week. She reports that she does not use drugs. family history includes Bone cancer in her maternal grandmother; Colon cancer in her maternal grandfather; Hypertension in her mother; Throat cancer in her father. Allergies  Allergen Reactions  . Sulfonamide Derivatives Rash      Outpatient Encounter Medications as of 07/26/2020   Medication Sig  . Calcium Carbonate-Vitamin D (CALCIUM 500 + D PO) Take by mouth daily.  . meloxicam (MOBIC) 15 MG tablet meloxicam 15 mg tablet  TAKE 1 TABLET BY MOUTH EVERY DAY  . Red Yeast Rice Extract (RED YEAST RICE PO) Take by mouth.  Marland Kitchen XIIDRA 5 % SOLN INSTILL 1 DROP IN BOTH EYES TWICE DAILY AS DIRECTED  . [DISCONTINUED] nitroGLYCERIN (NITRODUR - DOSED IN MG/24 HR) 0.2 mg/hr patch Place 1/4 to 1/2 of a patch over affected region. Remove and replace once daily.  Slightly alter skin placement daily   No facility-administered encounter medications on file as of 07/26/2020.     REVIEW OF SYSTEMS  : All other systems reviewed and negative except where noted in the History of Present Illness.   PHYSICAL EXAM: BP 120/80   Pulse (!) 48   Ht 5\' 4"  (1.626 m)   Wt 130 lb (59 kg)   BMI 22.31 kg/m  General: Well developed white female in no acute distress Head: Normocephalic and atraumatic Eyes:  Sclerae anicteric, conjunctiva pink. Ears: Normal auditory acuity Lungs: Clear throughout to auscultation; no W/R/R. Heart: Bradycardic; no M/R/G. Abdomen: Soft, non-distended.  BS present.  Non-tender. Rectal:  No external abnormalities noted.  DRE did not reveal any masses.  Small amount of mobile stool noted in rectal vault. Musculoskeletal: Symmetrical with no gross deformities  Skin: No lesions on visible extremities Extremities: No  edema  Neurological: Alert oriented x 4, grossly non-focal Psychological:  Alert and cooperative. Normal mood and affect  ASSESSMENT AND PLAN: *Altered bowel habits: Initially she was having diarrhea, but realized that was caffeine related and that has resolved with elimination of caffeine.  Now she complains of incomplete evacuation of stools and only passage of small amount of stools at a time.  Rectal exam again today showed no abnormalities.  I have asked her to begin taking Benefiber 2 teaspoons mixed in 8 ounces of liquid daily and if tolerated then  increase to twice daily after a week.  I have asked her to do this for a total of about 1 month and to call us with an update.  If no improvement and she still is very bothered by the symptoms then may consider flexible sigmoidoscopy although I think is very low yield will be unrevealing.   CC:  Midge Minium, MD

## 2020-07-26 NOTE — Patient Instructions (Signed)
If you are age 69 or older, your body mass index should be between 23-30. Your Body mass index is 22.31 kg/m. If this is out of the aforementioned range listed, please consider follow up with your Primary Care Provider.  If you are age 11 or younger, your body mass index should be between 19-25. Your Body mass index is 22.31 kg/m. If this is out of the aformentioned range listed, please consider follow up with your Primary Care Provider.   Start Benefiber 2 teaspoons in 8 ounces of liquid daily and may increase to twice daily if tolerated.   Call back in a month with an update and ask for Koren Shiver, RN.

## 2020-07-30 NOTE — Progress Notes (Signed)
____________________________________________________________  Attending physician addendum:  Thank you for sending this case to me. I have reviewed the entire note and agree with the plan.  May be pelvic floor dysfunction, so fiber may aid with evacuation.  Slightly raising feet often helps.  If not improved soon, schedule colonoscopy.  If unrevealing,referral to gynecology to evaluate for rectocele.  Wilfrid Lund, MD  ____________________________________________________________

## 2020-10-30 DIAGNOSIS — M25511 Pain in right shoulder: Secondary | ICD-10-CM | POA: Diagnosis not present

## 2020-11-01 ENCOUNTER — Other Ambulatory Visit: Payer: Self-pay

## 2020-11-01 ENCOUNTER — Encounter: Payer: Self-pay | Admitting: Family Medicine

## 2020-11-01 DIAGNOSIS — H9319 Tinnitus, unspecified ear: Secondary | ICD-10-CM

## 2020-11-01 DIAGNOSIS — H9190 Unspecified hearing loss, unspecified ear: Secondary | ICD-10-CM

## 2020-12-11 ENCOUNTER — Encounter: Payer: Self-pay | Admitting: *Deleted

## 2021-01-22 DIAGNOSIS — M9905 Segmental and somatic dysfunction of pelvic region: Secondary | ICD-10-CM | POA: Diagnosis not present

## 2021-01-22 DIAGNOSIS — Q7290 Unspecified reduction defect of unspecified lower limb: Secondary | ICD-10-CM | POA: Diagnosis not present

## 2021-01-22 DIAGNOSIS — M9903 Segmental and somatic dysfunction of lumbar region: Secondary | ICD-10-CM | POA: Diagnosis not present

## 2021-01-22 DIAGNOSIS — M545 Low back pain, unspecified: Secondary | ICD-10-CM | POA: Diagnosis not present

## 2021-01-22 DIAGNOSIS — M9906 Segmental and somatic dysfunction of lower extremity: Secondary | ICD-10-CM | POA: Diagnosis not present

## 2021-02-05 DIAGNOSIS — M9906 Segmental and somatic dysfunction of lower extremity: Secondary | ICD-10-CM | POA: Diagnosis not present

## 2021-02-05 DIAGNOSIS — M9903 Segmental and somatic dysfunction of lumbar region: Secondary | ICD-10-CM | POA: Diagnosis not present

## 2021-02-05 DIAGNOSIS — Q7290 Unspecified reduction defect of unspecified lower limb: Secondary | ICD-10-CM | POA: Diagnosis not present

## 2021-02-05 DIAGNOSIS — M9905 Segmental and somatic dysfunction of pelvic region: Secondary | ICD-10-CM | POA: Diagnosis not present

## 2021-02-05 DIAGNOSIS — M545 Low back pain, unspecified: Secondary | ICD-10-CM | POA: Diagnosis not present

## 2021-04-29 ENCOUNTER — Encounter: Payer: Self-pay | Admitting: Family Medicine

## 2021-05-13 ENCOUNTER — Encounter: Payer: Self-pay | Admitting: Family Medicine

## 2021-05-13 ENCOUNTER — Ambulatory Visit (INDEPENDENT_AMBULATORY_CARE_PROVIDER_SITE_OTHER): Payer: PPO | Admitting: Family Medicine

## 2021-05-13 VITALS — BP 110/62 | HR 56 | Temp 97.4°F | Resp 16 | Wt 129.4 lb

## 2021-05-13 DIAGNOSIS — E785 Hyperlipidemia, unspecified: Secondary | ICD-10-CM | POA: Diagnosis not present

## 2021-05-13 DIAGNOSIS — Z0000 Encounter for general adult medical examination without abnormal findings: Secondary | ICD-10-CM

## 2021-05-13 LAB — BASIC METABOLIC PANEL
BUN: 17 mg/dL (ref 6–23)
CO2: 29 mEq/L (ref 19–32)
Calcium: 9.3 mg/dL (ref 8.4–10.5)
Chloride: 102 mEq/L (ref 96–112)
Creatinine, Ser: 0.78 mg/dL (ref 0.40–1.20)
GFR: 77.45 mL/min (ref >60.00)
Glucose, Bld: 59 mg/dL — ABNORMAL LOW (ref 70–99)
Potassium: 4.1 mEq/L (ref 3.5–5.1)
Sodium: 141 mEq/L (ref 135–145)

## 2021-05-13 LAB — CBC WITH DIFFERENTIAL/PLATELET
Basophils Absolute: 0 10*3/uL (ref 0.0–0.1)
Basophils Relative: 0.7 % (ref 0.0–3.0)
Eosinophils Absolute: 0.2 10*3/uL (ref 0.0–0.7)
Eosinophils Relative: 5.2 % — ABNORMAL HIGH (ref 0.0–5.0)
HCT: 42.8 % (ref 36.0–46.0)
Hemoglobin: 14 g/dL (ref 12.0–15.0)
Lymphocytes Relative: 22.9 % (ref 12.0–46.0)
Lymphs Abs: 0.9 10*3/uL (ref 0.7–4.0)
MCHC: 32.8 g/dL (ref 30.0–36.0)
MCV: 91.2 fl (ref 78.0–100.0)
Monocytes Absolute: 0.3 10*3/uL (ref 0.1–1.0)
Monocytes Relative: 8.2 % (ref 3.0–12.0)
Neutro Abs: 2.6 10*3/uL (ref 1.4–7.7)
Neutrophils Relative %: 63 % (ref 43.0–77.0)
Platelets: 143 10*3/uL — ABNORMAL LOW (ref 150.0–400.0)
RBC: 4.69 Mil/uL (ref 3.87–5.11)
RDW: 14.3 % (ref 11.5–15.5)
WBC: 4.1 10*3/uL (ref 4.0–10.5)

## 2021-05-13 LAB — HEPATIC FUNCTION PANEL
ALT: 22 U/L (ref 0–35)
AST: 20 U/L (ref 0–37)
Albumin: 4.3 g/dL (ref 3.5–5.2)
Alkaline Phosphatase: 63 U/L (ref 39–117)
Bilirubin, Direct: 0.2 mg/dL (ref 0.0–0.3)
Total Bilirubin: 1.4 mg/dL — ABNORMAL HIGH (ref 0.2–1.2)
Total Protein: 6.6 g/dL (ref 6.0–8.3)

## 2021-05-13 LAB — LIPID PANEL
Cholesterol: 238 mg/dL — ABNORMAL HIGH (ref 0–200)
HDL: 83.5 mg/dL (ref >39.00)
LDL Cholesterol: 140 mg/dL — ABNORMAL HIGH (ref 0–99)
NonHDL: 154.57
Total CHOL/HDL Ratio: 3
Triglycerides: 74 mg/dL (ref 0.0–149.0)
VLDL: 14.8 mg/dL (ref 0.0–40.0)

## 2021-05-13 LAB — TSH: TSH: 0.69 u[IU]/mL (ref 0.35–5.50)

## 2021-05-13 NOTE — Assessment & Plan Note (Signed)
Pt's PE WNL.  UTD on mammo, colonoscopy, DEXA, immunizations.  Check labs.  Anticipatory guidance provided.

## 2021-05-13 NOTE — Progress Notes (Signed)
   Subjective:    Patient ID: Nicole Benson, female    DOB: 07-Oct-1950, 70 y.o.   MRN: [id number]  HPI CPE- UTD on colonoscopy, mammo done- attempting to get records.  UTD on DEXA.  UTD on PNA vaccines, Tdap, flu.  Exercising regularly.  Patient Care Team    Relationship Specialty Notifications Start End  Midge Minium, MD PCP - General Family Medicine  12/10/16   Pc, [facility] Audiologist Audiology  08/05/17     Health Maintenance  Topic Date Due   MAMMOGRAM  05/01/2021   COLONOSCOPY (Pts 45-104yrs Insurance coverage will need to be confirmed)  02/19/2027   TETANUS/TDAP  09/26/2029   Pneumonia Vaccine 87+ Years old  Completed   INFLUENZA VACCINE  Completed   DEXA SCAN  Completed   COVID-19 Vaccine  Completed   Hepatitis C Screening  Completed   Zoster Vaccines- Shingrix  Completed   HPV VACCINES  Aged Out      Review of Systems Patient reports no vision changes, adenopathy,fever, weight change,  persistant/recurrent hoarseness , swallowing issues, chest pain, palpitations, edema, persistant/recurrent cough, hemoptysis, dyspnea (rest/exertional/paroxysmal nocturnal), gastrointestinal bleeding (melena, rectal bleeding), abdominal pain, significant heartburn, bowel changes, GU symptoms (dysuria, hematuria, incontinence), Gyn symptoms (abnormal  bleeding, pain),  syncope, focal weakness, memory loss, numbness & tingling, skin/hair/nail changes, abnormal bruising or bleeding, anxiety, or depression.   + tinnitus, hearing aids recommended  This visit occurred during the SARS-CoV-2 public health emergency.  Safety protocols were in place, including screening questions prior to the visit, additional usage of staff PPE, and extensive cleaning of exam room while observing appropriate contact time as indicated for disinfecting solutions.      Objective:   Physical Exam General Appearance:    Alert, cooperative, no distress, appears stated age  Head:     Normocephalic, without obvious abnormality, atraumatic  Eyes:    PERRL, conjunctiva/corneas clear, EOM's intact, fundi    benign, both eyes  Ears:    Normal TM's and external ear canals, both ears  Nose:   Deferred due to COVID  Throat:   Neck:   Supple, symmetrical, trachea midline, no adenopathy;    Thyroid: no enlargement/tenderness/nodules  Back:     Symmetric, no curvature, ROM normal, no CVA tenderness  Lungs:     Clear to auscultation bilaterally, respirations unlabored  Chest Wall:    No tenderness or deformity   Heart:    Regular rate and rhythm, S1 and S2 normal, no murmur, rub   or gallop  Breast Exam:    Deferred to GYN  Abdomen:     Soft, non-tender, bowel sounds active all four quadrants,    no masses, no organomegaly  Genitalia:    Deferred to GYN  Rectal:    Extremities:   Extremities normal, atraumatic, no cyanosis or edema  Pulses:   2+ and symmetric all extremities  Skin:   Skin color, texture, turgor normal, no rashes or lesions  Lymph nodes:   Cervical, supraclavicular, and axillary nodes normal  Neurologic:   CNII-XII intact, normal strength, sensation and reflexes    throughout          Assessment & Plan:

## 2021-05-13 NOTE — Patient Instructions (Addendum)
Follow up in 1 year or as needed We'll notify you of your lab results and make any changes if needed Continue to work on healthy diet and regular exercise- you look great! Call with any questions or concerns Stay Safe! Stay Healthy! Happy Holidays!!!  

## 2021-05-13 NOTE — Assessment & Plan Note (Signed)
Ongoing issue for pt.  Attempting to control w/ diet and exercise.  Check labs and determine next steps.

## 2021-05-15 ENCOUNTER — Telehealth: Payer: Self-pay

## 2021-05-15 NOTE — Telephone Encounter (Signed)
-----   Message from Midge Minium, MD sent at 05/14/2021  7:25 AM EST ----- Labs look great!  Total cholesterol and LDL (bad cholesterol) are still above goal but thankfully the ratio of good to bad cholesterol is fantastic!  No need for meds at this time.

## 2021-05-15 NOTE — Telephone Encounter (Signed)
Patient is aware of labs °

## 2021-05-20 IMAGING — CR DG FINGER LITTLE 2+V*L*
3 series · 3 of 3 positions shown · non-contrast
Comparison: None.

CLINICAL DATA: Injury

EXAM:
LEFT LITTLE FINGER 2+V

[x finger pa left]
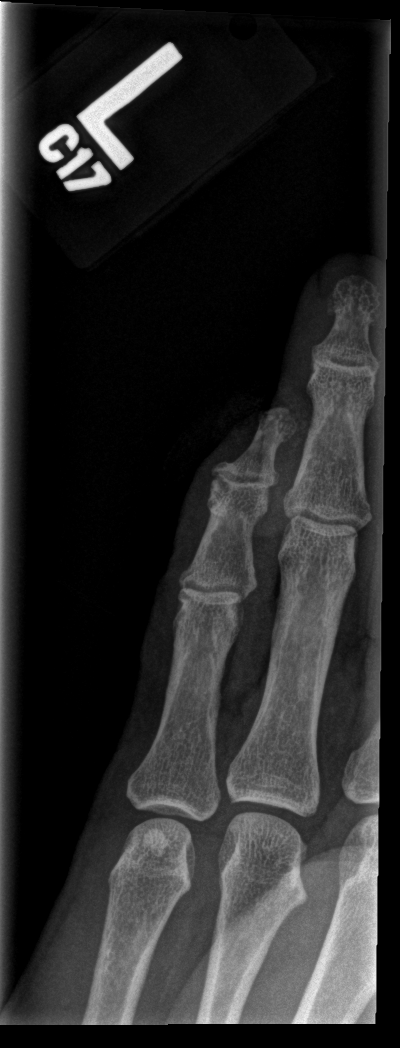

[x finger obl left]
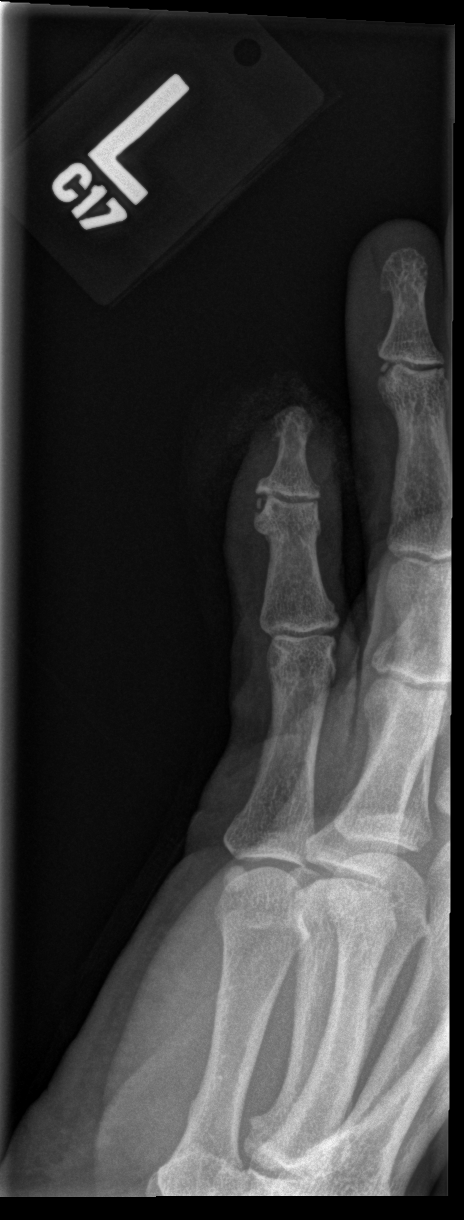

[x finger lat left]
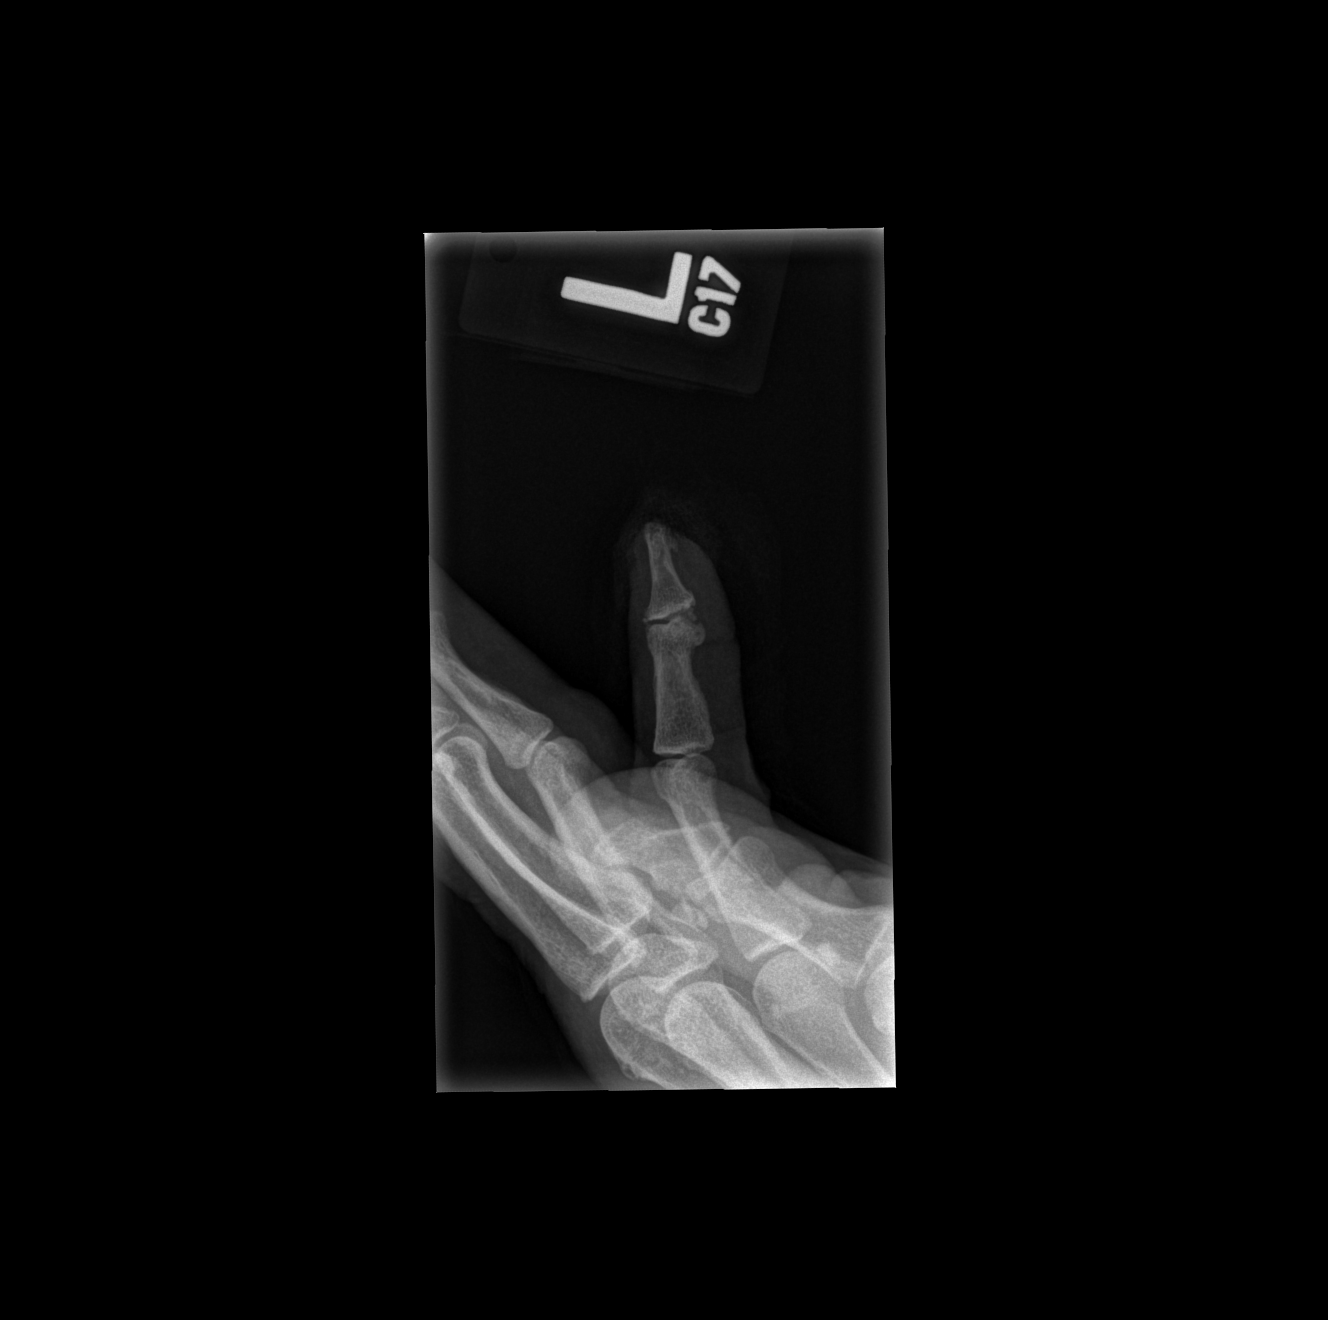

[3 of 3 positions shown; findings below may reference images not displayed]

FINDINGS: Soft tissue injury to the tuft of the little finger with apparent
exposure of bone. No visible fracture or opaque foreign body. No
dislocation.
IMPRESSION: Soft tissue injury to the distal little finger with exposed or
nearly exposed tuft. No acute fracture.

## 2021-05-30 ENCOUNTER — Encounter: Payer: Self-pay | Admitting: Family Medicine

## 2021-05-30 DIAGNOSIS — H9190 Unspecified hearing loss, unspecified ear: Secondary | ICD-10-CM

## 2021-05-30 DIAGNOSIS — H9319 Tinnitus, unspecified ear: Secondary | ICD-10-CM

## 2021-06-02 NOTE — Telephone Encounter (Signed)
I have faxed over your referral to [facility], they should contact you to get an appointment set up. If you haven't heard from them in the next 2 weeks you can call them. Office information listed below. Please let us know if you need anything else.   [facility] [address], [place], [place] [address]  [phone]

## 2021-06-13 ENCOUNTER — Encounter: Payer: Self-pay | Admitting: Family Medicine

## 2021-06-18 IMAGING — DX DG KNEE AP/LAT W/ SUNRISE*L*
3 series · 3 of 3 positions shown · non-contrast
Comparison: 10/24/2012

CLINICAL DATA: Chronic left knee pain

EXAM:
LEFT KNEE 3 VIEWS

[dg knee ap/lat w/ sunrise left (1 of 3)]
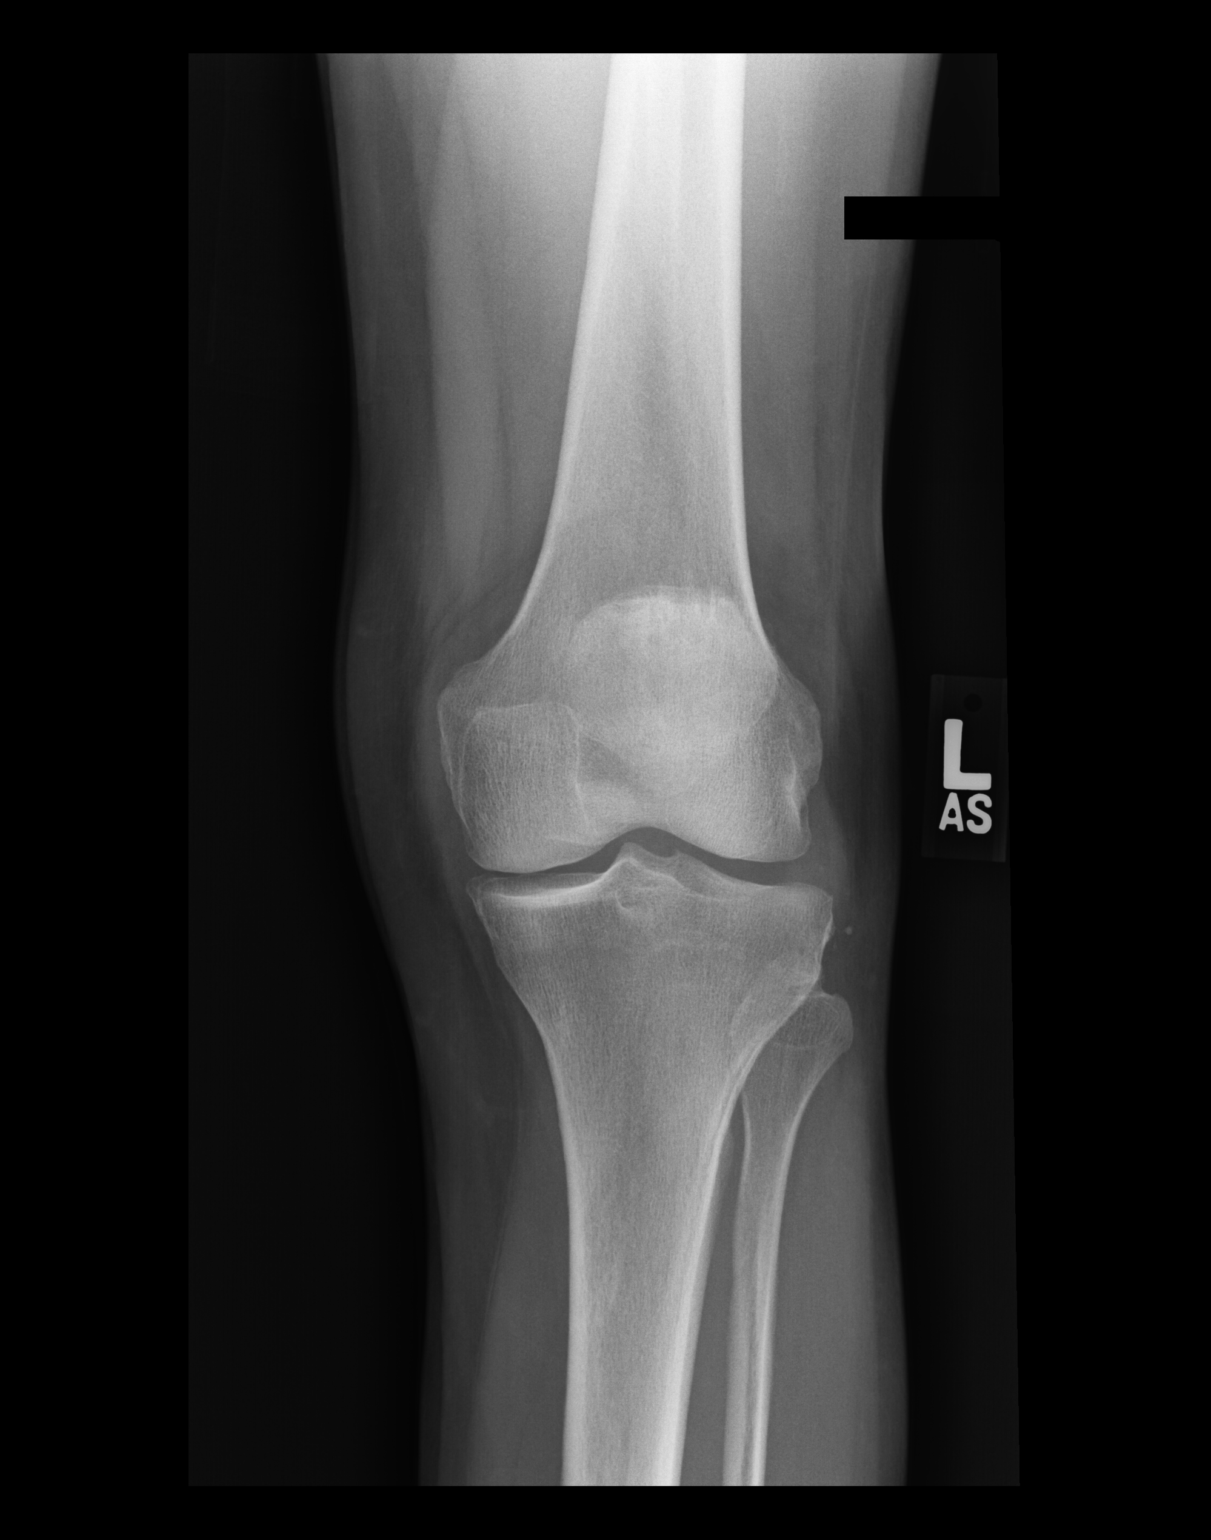

[dg knee ap/lat w/ sunrise left (2 of 3)]
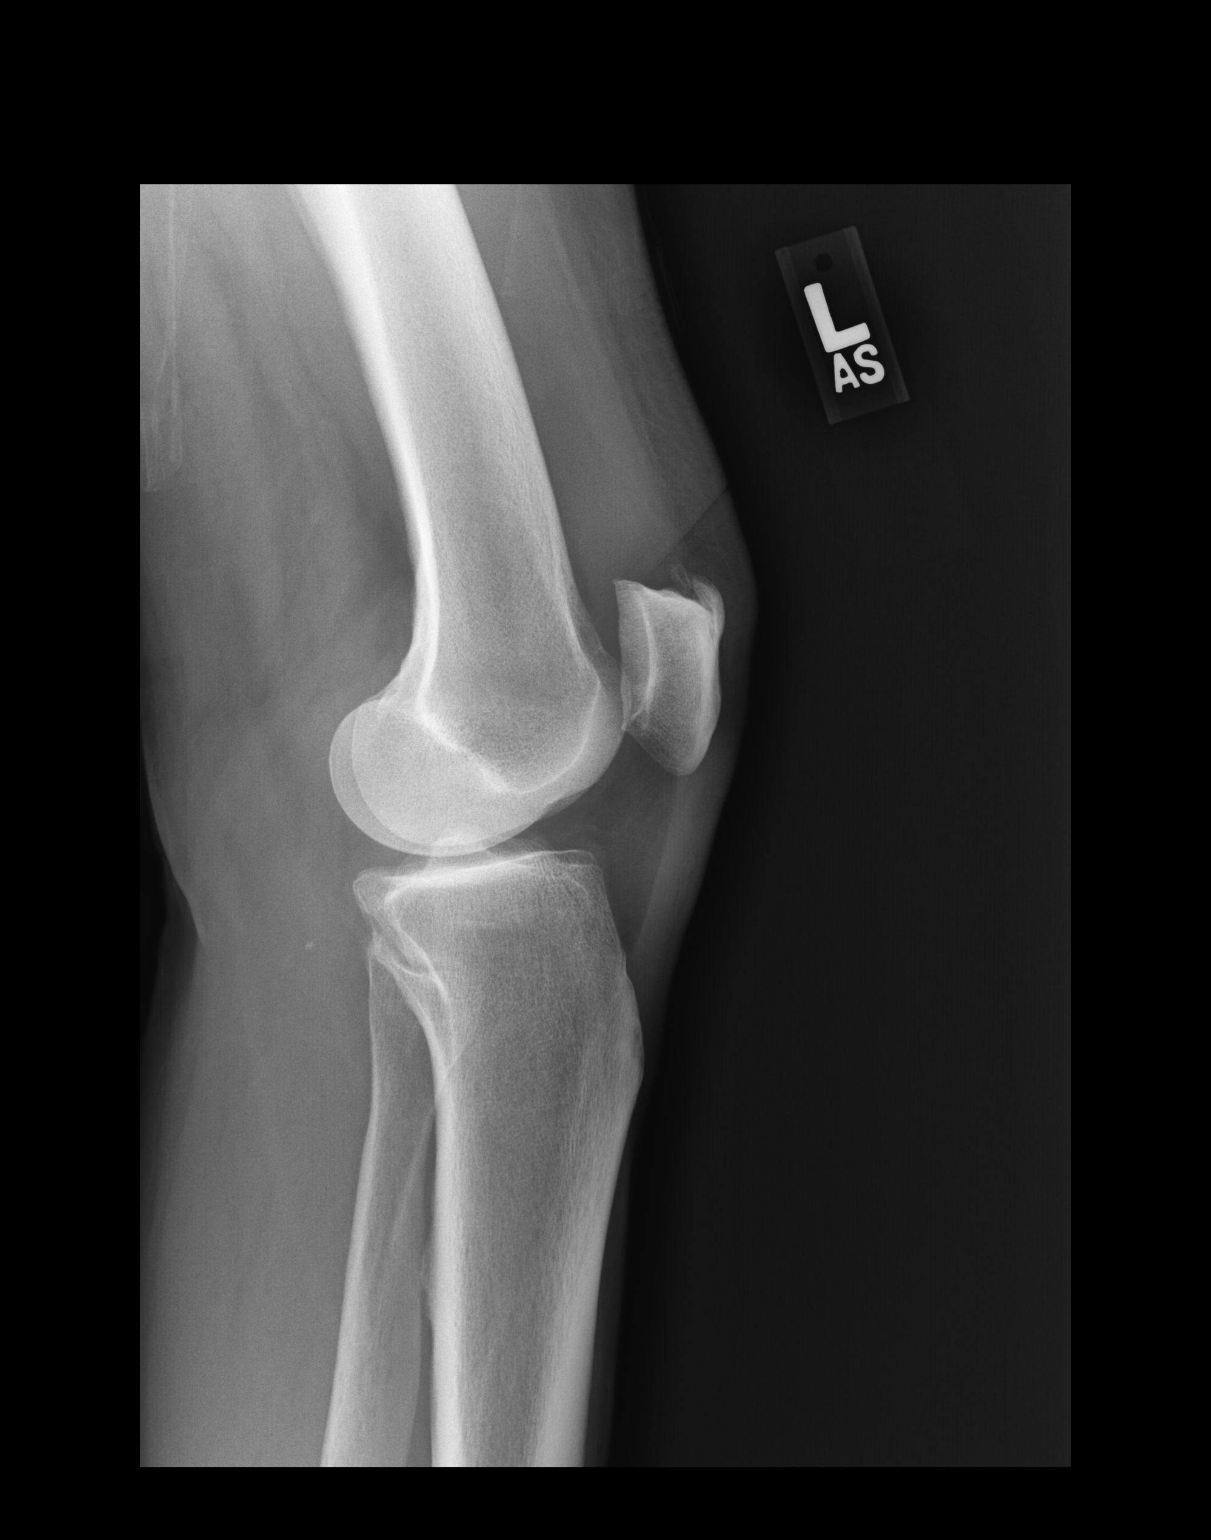

[dg knee ap/lat w/ sunrise left (3 of 3)]
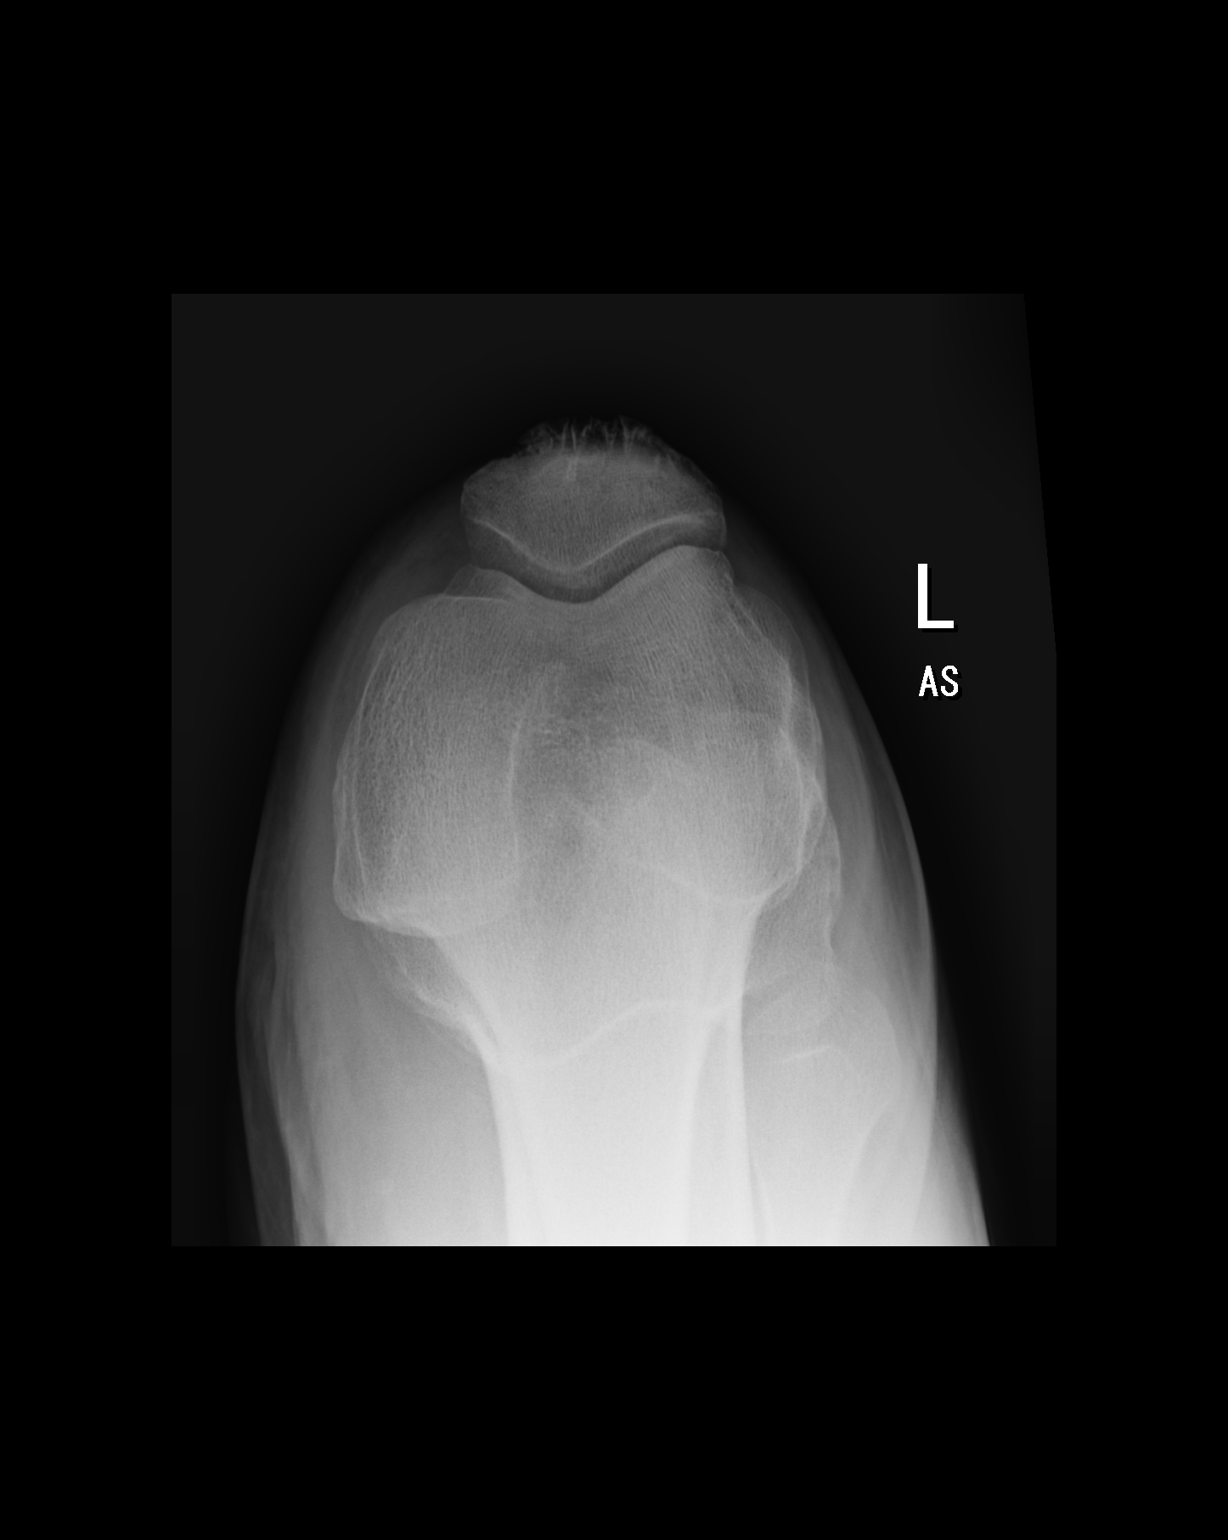

[3 of 3 positions shown; findings below may reference images not displayed]

FINDINGS: Frontal, lateral, and sunrise views of the left knee demonstrate no
fractures. There is mild medial and patellofemoral compartmental
osteoarthritis. Small joint effusion. Soft tissues are otherwise
unremarkable.
IMPRESSION: 1. Mild osteoarthritis.
2. Small joint effusion, likely reactive.
3. No acute bony abnormality.

## 2021-06-25 ENCOUNTER — Telehealth (INDEPENDENT_AMBULATORY_CARE_PROVIDER_SITE_OTHER): Payer: PPO | Admitting: Registered Nurse

## 2021-06-25 ENCOUNTER — Encounter: Payer: Self-pay | Admitting: Registered Nurse

## 2021-06-25 ENCOUNTER — Other Ambulatory Visit: Payer: Self-pay

## 2021-06-25 DIAGNOSIS — B9689 Other specified bacterial agents as the cause of diseases classified elsewhere: Secondary | ICD-10-CM

## 2021-06-25 DIAGNOSIS — J019 Acute sinusitis, unspecified: Secondary | ICD-10-CM | POA: Diagnosis not present

## 2021-06-25 MED ORDER — PREDNISONE 10 MG PO TABS: ORAL_TABLET | ORAL | 0 refills | Status: AC

## 2021-06-25 MED ORDER — AMOXICILLIN-POT CLAVULANATE 875-125 MG PO TABS: 1.0000 | ORAL_TABLET | Freq: Two times a day (BID) | ORAL | 0 refills | Status: DC

## 2021-06-25 NOTE — Patient Instructions (Signed)
° ° ° °  If you have lab work done today you will be contacted with your lab results within the next 2 weeks.  If you have not heard from us then please contact us. The fastest way to get your results is to register for My Chart. ° ° °IF you received an x-ray today, you will receive an invoice from [facility]. Please contact [facility] at [phone] with questions or concerns regarding your invoice.  ° °IF you received labwork today, you will receive an invoice from [facility]. Please contact [facility] at [phone] with questions or concerns regarding your invoice.  ° °Our billing staff will not be able to assist you with questions regarding bills from these companies. ° °You will be contacted with the lab results as soon as they are available. The fastest way to get your results is to activate your My Chart account. Instructions are located on the last page of this paperwork. If you have not heard from us regarding the results in 2 weeks, please contact this office. °  ° ° ° °

## 2021-06-25 NOTE — Progress Notes (Addendum)
Telemedicine Encounter- SOAP NOTE Established Patient  This telephone encounter was conducted with the patient's (or proxy's) verbal consent via audio telecommunications: yes/no: Yes Patient was instructed to have this encounter in a suitably private space; and to only have persons present to whom they give permission to participate. In addition, patient identity was confirmed by use of name plus two identifiers (DOB and address).  I discussed the limitations, risks, security and privacy concerns of performing an evaluation and management service by telephone and the availability of in person appointments. I also discussed with the patient that there may be a patient responsible charge related to this service. The patient expressed understanding and agreed to proceed.  I spent a total of 14 minutes talking with the patient or their proxy.  Patient at home Provider in office  Participants: Kathrin Ruddy, NP and Rae Roam  Chief Complaint  Patient presents with   Cough    Patient states she has had covid 21 days ago and still have some cough and congestion. Pt states she used OTC medications that has not worked , but thinks some prednisone will work.    Subjective   Nicole Benson is a 71 y.o. established patient. Telephone visit today for sinus pressure.   HPI COVID positive 21 weeks ago. Mostly resolved, but lingering sinus pressure, pnd, cough, and congestion.   Did have chronic recurrent sinusitis, less frequent since COVID Always has success with prednisone  Using flonase, saline nasal spray, neti-pot  Patient Active Problem List   Diagnosis Date Noted   Right shoulder pain 12/05/2019   Baker's cyst of knee, left 05/23/2019   Hyperlipidemia 02/14/2019   Physical exam 04/29/2017   Polyarthralgia 03/29/2017   Low back pain 12/10/2016   SUBSCAPULARIS SPRAIN AND STRAIN 02/21/2009   Persistent disorder of initiating or maintaining sleep 12/23/2006   MENISCUS INJURY  12/23/2006    Past Medical History:  Diagnosis Date   Chronic lower back pain    Heart murmur     Current Outpatient Medications  Medication Sig Dispense Refill   amoxicillin-clavulanate (AUGMENTIN) 875-125 MG tablet Take 1 tablet by mouth 2 (two) times daily. 20 tablet 0   Calcium Carbonate-Vitamin D (CALCIUM 500 + D PO) Take by mouth daily.     meloxicam (MOBIC) 15 MG tablet meloxicam 15 mg tablet  TAKE 1 TABLET BY MOUTH EVERY DAY     predniSONE (DELTASONE) 10 MG tablet Take 4 tablets (40 mg total) by mouth daily with breakfast for 2 days, THEN 3 tablets (30 mg total) daily with breakfast for 2 days, THEN 2 tablets (20 mg total) daily with breakfast for 2 days, THEN 1 tablet (10 mg total) daily with breakfast for 2 days. 20 tablet 0   XIIDRA 5 % SOLN INSTILL 1 DROP IN BOTH EYES TWICE DAILY AS DIRECTED     No current facility-administered medications for this visit.    Allergies  Allergen Reactions   Sulfonamide Derivatives Rash    Social History   Socioeconomic History   Marital status: Married    Spouse name: Not on file   Number of children: 1   Years of education: Not on file   Highest education level: Not on file  Occupational History   Occupation: botanist  Tobacco Use   Smoking status: Never   Smokeless tobacco: Never  Vaping Use   Vaping Use: Never used  Substance and Sexual Activity   Alcohol use: Yes    Alcohol/week: 7.0 standard drinks  Types: 7 Glasses of wine per week    Comment: 4 drinks a week   Drug use: No   Sexual activity: Yes    Partners: Male  Other Topics Concern   Not on file  Social History Narrative   Married. Education: [employer]. Exercise: Yes.   Social Determinants of Health   Financial Resource Strain: Not on file  Food Insecurity: Not on file  Transportation Needs: Not on file  Physical Activity: Not on file  Stress: Not on file  Social Connections: Not on file  Intimate Partner Violence: Not on file    Review of Systems   Constitutional: Negative.   HENT:  Positive for congestion and sinus pain. Negative for ear discharge, ear pain, hearing loss, nosebleeds, sore throat and tinnitus.   Eyes: Negative.   Respiratory:  Positive for cough. Negative for hemoptysis, sputum production, shortness of breath, wheezing and stridor.   Cardiovascular: Negative.   Gastrointestinal: Negative.   Genitourinary: Negative.   Musculoskeletal: Negative.   Skin: Negative.   Neurological: Negative.   Endo/Heme/Allergies: Negative.   Psychiatric/Behavioral: Negative.    All other systems reviewed and are negative.  Objective   Vitals as reported by the patient: There were no vitals filed for this visit.  Nicole Benson was seen today for cough.  Diagnoses and all orders for this visit:  Acute bacterial sinusitis -     amoxicillin-clavulanate (AUGMENTIN) 875-125 MG tablet; Take 1 tablet by mouth 2 (two) times daily. -     predniSONE (DELTASONE) 10 MG tablet; Take 4 tablets (40 mg total) by mouth daily with breakfast for 2 days, THEN 3 tablets (30 mg total) daily with breakfast for 2 days, THEN 2 tablets (20 mg total) daily with breakfast for 2 days, THEN 1 tablet (10 mg total) daily with breakfast for 2 days.    PLAN [place] post covid. Treat as above Continue supportive care with nasal spray, neti pot, saline rinse, rest, hydration If worsening or failing to improve in 2 days, call clinic Patient encouraged to call clinic with any questions, comments, or concerns.  I discussed the assessment and treatment plan with the patient. The patient was provided an opportunity to ask questions and all were answered. The patient agreed with the plan and demonstrated an understanding of the instructions.   The patient was advised to call back or seek an in-person evaluation if the symptoms worsen or if the condition fails to improve as anticipated.  I provided 14 minutes of non-face-to-face time during this encounter.  [name],  NP

## 2021-07-01 DIAGNOSIS — H903 Sensorineural hearing loss, bilateral: Secondary | ICD-10-CM | POA: Diagnosis not present

## 2021-07-01 DIAGNOSIS — L299 Pruritus, unspecified: Secondary | ICD-10-CM | POA: Diagnosis not present

## 2021-07-01 DIAGNOSIS — H9313 Tinnitus, bilateral: Secondary | ICD-10-CM | POA: Diagnosis not present

## 2021-07-01 DIAGNOSIS — J329 Chronic sinusitis, unspecified: Secondary | ICD-10-CM | POA: Diagnosis not present

## 2021-07-02 DIAGNOSIS — Z1231 Encounter for screening mammogram for malignant neoplasm of breast: Secondary | ICD-10-CM | POA: Diagnosis not present

## 2021-07-02 LAB — HM MAMMOGRAPHY

## 2021-10-08 ENCOUNTER — Telehealth: Payer: Self-pay | Admitting: Family Medicine

## 2021-10-08 NOTE — Telephone Encounter (Signed)
Left message for patient to call back and schedule Medicare Annual Wellness Visit (AWV).  ? ?Please offer to do virtually or by telephone.  Left office number and my jabber [phone]. ? ?Last AWV:03/04/2020 ? ?Please schedule at anytime with Nurse Health Advisor. ?  ?

## 2021-10-16 ENCOUNTER — Ambulatory Visit (INDEPENDENT_AMBULATORY_CARE_PROVIDER_SITE_OTHER): Payer: PPO

## 2021-10-16 DIAGNOSIS — Z0000 Encounter for general adult medical examination without abnormal findings: Secondary | ICD-10-CM

## 2021-10-16 NOTE — Patient Instructions (Signed)
Nicole Benson , ?Thank you for taking time to come for your Medicare Wellness Visit. I appreciate your ongoing commitment to your health goals. Please review the following plan we discussed and let me know if I can assist you in the future.  ? ?Screening recommendations/referrals: ?Colonoscopy: 02/18/2017  due 2028 ?Mammogram: 06/2021 ?Bone Density: 07/17/2020 ?Recommended yearly ophthalmology/optometry visit for glaucoma screening and checkup ?Recommended yearly dental visit for hygiene and checkup ? ?Vaccinations: ?Influenza vaccine: completed  ?Pneumococcal vaccine: completed  ?Tdap vaccine: 02/05/2012 ?Shingles vaccine: completed    ? ?Advanced directives: yes  ? ?Conditions/risks identified: none  ? ?Next appointment: none  ? ? ?Preventive Care 70 Years and Older, Female ?Preventive care refers to lifestyle choices and visits with your health care provider that can promote health and wellness. ?What does preventive care include? ?A yearly physical exam. This is also called an annual well check. ?Dental exams once or twice a year. ?Routine eye exams. Ask your health care provider how often you should have your eyes checked. ?Personal lifestyle choices, including: ?Daily care of your teeth and gums. ?Regular physical activity. ?Eating a healthy diet. ?Avoiding tobacco and drug use. ?Limiting alcohol use. ?Practicing safe sex. ?Taking low-dose aspirin every day. ?Taking vitamin and mineral supplements as recommended by your health care provider. ?What happens during an annual well check? ?The services and screenings done by your health care provider during your annual well check will depend on your age, overall health, lifestyle risk factors, and family history of disease. ?Counseling  ?Your health care provider may ask you questions about your: ?Alcohol use. ?Tobacco use. ?Drug use. ?Emotional well-being. ?Home and relationship well-being. ?Sexual activity. ?Eating habits. ?History of falls. ?Memory and ability to  understand (cognition). ?Work and work Statistician. ?Reproductive health. ?Screening  ?You may have the following tests or measurements: ?Height, weight, and BMI. ?Blood pressure. ?Lipid and cholesterol levels. These may be checked every 5 years, or more frequently if you are over 48 years old. ?Skin check. ?Lung cancer screening. You may have this screening every year starting at age 55 if you have a 30-pack-year history of smoking and currently smoke or have quit within the past 15 years. ?Fecal occult blood test (FOBT) of the stool. You may have this test every year starting at age 75. ?Flexible sigmoidoscopy or colonoscopy. You may have a sigmoidoscopy every 5 years or a colonoscopy every 10 years starting at age 73. ?Hepatitis C blood test. ?Hepatitis B blood test. ?Sexually transmitted disease (STD) testing. ?Diabetes screening. This is done by checking your blood sugar (glucose) after you have not eaten for a while (fasting). You may have this done every 1-3 years. ?Bone density scan. This is done to screen for osteoporosis. You may have this done starting at age 30. ?Mammogram. This may be done every 1-2 years. Talk to your health care provider about how often you should have regular mammograms. ?Talk with your health care provider about your test results, treatment options, and if necessary, the need for more tests. ?Vaccines  ?Your health care provider may recommend certain vaccines, such as: ?Influenza vaccine. This is recommended every year. ?Tetanus, diphtheria, and acellular pertussis (Tdap, Td) vaccine. You may need a Td booster every 10 years. ?Zoster vaccine. You may need this after age 6. ?Pneumococcal 13-valent conjugate (PCV13) vaccine. One dose is recommended after age 13. ?Pneumococcal polysaccharide (PPSV23) vaccine. One dose is recommended after age 72. ?Talk to your health care provider about which screenings and vaccines you need and  how often you need them. ?This information is not  intended to replace advice given to you by your health care provider. Make sure you discuss any questions you have with your health care provider. ?Document Released: 06/28/2015 Document Revised: 02/19/2016 Document Reviewed: 04/02/2015 ?Elsevier Interactive Patient Education ? 2017 [place]. ? ?Fall Prevention in the Home ?Falls can cause injuries. They can happen to people of all ages. There are many things you can do to make your home safe and to help prevent falls. ?What can I do on the outside of my home? ?Regularly fix the edges of walkways and driveways and fix any cracks. ?Remove anything that might make you trip as you walk through a door, such as a raised step or threshold. ?Trim any bushes or trees on the path to your home. ?Use bright outdoor lighting. ?Clear any walking paths of anything that might make someone trip, such as rocks or tools. ?Regularly check to see if handrails are loose or broken. Make sure that both sides of any steps have handrails. ?Any raised decks and porches should have guardrails on the edges. ?Have any leaves, snow, or ice cleared regularly. ?Use sand or salt on walking paths during winter. ?Clean up any spills in your garage right away. This includes oil or grease spills. ?What can I do in the bathroom? ?Use night lights. ?Install grab bars by the toilet and in the tub and shower. Do not use towel bars as grab bars. ?Use non-skid mats or decals in the tub or shower. ?If you need to sit down in the shower, use a plastic, non-slip stool. ?Keep the floor dry. Clean up any water that spills on the floor as soon as it happens. ?Remove soap buildup in the tub or shower regularly. ?Attach bath mats securely with double-sided non-slip rug tape. ?Do not have throw rugs and other things on the floor that can make you trip. ?What can I do in the bedroom? ?Use night lights. ?Make sure that you have a light by your bed that is easy to reach. ?Do not use any sheets or blankets that are  too big for your bed. They should not hang down onto the floor. ?Have a firm chair that has side arms. You can use this for support while you get dressed. ?Do not have throw rugs and other things on the floor that can make you trip. ?What can I do in the kitchen? ?Clean up any spills right away. ?Avoid walking on wet floors. ?Keep items that you use a lot in easy-to-reach places. ?If you need to reach something above you, use a strong step stool that has a grab bar. ?Keep electrical cords out of the way. ?Do not use floor polish or wax that makes floors slippery. If you must use wax, use non-skid floor wax. ?Do not have throw rugs and other things on the floor that can make you trip. ?What can I do with my stairs? ?Do not leave any items on the stairs. ?Make sure that there are handrails on both sides of the stairs and use them. Fix handrails that are broken or loose. Make sure that handrails are as long as the stairways. ?Check any carpeting to make sure that it is firmly attached to the stairs. Fix any carpet that is loose or worn. ?Avoid having throw rugs at the top or bottom of the stairs. If you do have throw rugs, attach them to the floor with carpet tape. ?Make sure that you have  a light switch at the top of the stairs and the bottom of the stairs. If you do not have them, ask someone to add them for you. ?What else can I do to help prevent falls? ?Wear shoes that: ?Do not have high heels. ?Have rubber bottoms. ?Are comfortable and fit you well. ?Are closed at the toe. Do not wear sandals. ?If you use a stepladder: ?Make sure that it is fully opened. Do not climb a closed stepladder. ?Make sure that both sides of the stepladder are locked into place. ?Ask someone to hold it for you, if possible. ?Clearly mark and make sure that you can see: ?Any grab bars or handrails. ?First and last steps. ?Where the edge of each step is. ?Use tools that help you move around (mobility aids) if they are needed. These  include: ?Canes. ?Walkers. ?Scooters. ?Crutches. ?Turn on the lights when you go into a dark area. Replace any light bulbs as soon as they burn out. ?Set up your furniture so you have a clear path. Avoid moving your

## 2021-10-16 NOTE — Progress Notes (Signed)
? ?Subjective:  ? Nicole Benson is a 71 y.o. female who presents for Medicare Annual (Subsequent) preventive examination. ? ? ?I connected with [name]  today by telephone and verified that I am speaking with the correct person using two identifiers. ?Location patient: home ?Location provider: work ?Persons participating in the virtual visit: patient, provider. ?  ?I discussed the limitations, risks, security and privacy concerns of performing an evaluation and management service by telephone and the availability of in person appointments. I also discussed with the patient that there may be a patient responsible charge related to this service. The patient expressed understanding and verbally consented to this telephonic visit.  ?  ?Interactive audio and video telecommunications were attempted between this provider and patient, however failed, due to patient having technical difficulties OR patient did not have access to video capability.  We continued and completed visit with audio only. ? ?  ?Review of Systems    ? ?Cardiac Risk Factors include: advanced age (>65mn, >>69women) ? ?   ?Objective:  ?  ?Today's Vitals  ? ?There is no height or weight on file to calculate BMI. ? ? ?  10/16/2021  ?  8:51 AM 03/04/2020  ?  8:20 AM 09/27/2019  ? 10:15 AM 05/27/2018  ?  9:22 AM 02/04/2017  ?  8:41 AM 09/16/2015  ? 10:02 AM 08/06/2014  ?  1:34 PM  ?Advanced Directives  ?Does Patient Have a Medical Advance Directive? Yes Yes Yes No Yes Yes Yes  ?Type of AParamedicof AChelseaLiving will H[place]idlandLiving will HChestnutLiving will  HHopkintonLiving will H[place]ldonLiving will Living will;Healthcare Power of Attorney  ?Copy of [place] Chart? No - copy requested No - copy requested   No - copy requested    ?Would patient like information on creating a medical advance directive?    No - Patient declined      ? ? ?Current Medications (verified) ?Outpatient Encounter Medications as of 10/16/2021  ?Medication Sig  ? Calcium Carbonate-Vitamin D (CALCIUM 500 + D PO) Take by mouth daily.  ? meloxicam (MOBIC) 15 MG tablet meloxicam 15 mg tablet ? TAKE 1 TABLET BY MOUTH EVERY DAY  ? XIIDRA 5 % SOLN INSTILL 1 DROP IN BOTH EYES TWICE DAILY AS DIRECTED  ? amoxicillin-clavulanate (AUGMENTIN) 875-125 MG tablet Take 1 tablet by mouth 2 (two) times daily. (Patient not taking: Reported on 10/16/2021)  ? ?No facility-administered encounter medications on file as of 10/16/2021.  ? ? ?Allergies (verified) ?Sulfonamide derivatives  ? ?History: ?Past Medical History:  ?Diagnosis Date  ? Chronic lower back pain   ? Heart murmur   ? ?Past Surgical History:  ?Procedure Laterality Date  ? ABLATION    ? Radio frequency lower back  ? COLONOSCOPY    ? COLONOSCOPY W/ BIOPSIES    ? hyperplastic polyp  ? KNEE ARTHROSCOPY Left 12/06/2019  ? KNEE ARTHROSCOPY Right 2016  ? POLYPECTOMY    ? ?Family History  ?Problem Relation Age of Onset  ? Hypertension Mother   ? Throat cancer Father   ? Bone cancer Maternal Grandmother   ? Colon cancer Maternal Grandfather   ?     ? early 762's ? Esophageal cancer Neg Hx   ? Pancreatic cancer Neg Hx   ? Rectal cancer Neg Hx   ? Stomach cancer Neg Hx   ? ?Social History  ? ?Socioeconomic History  ?  Marital status: Married  ?  Spouse name: Not on file  ? Number of children: 1  ? Years of education: Not on file  ? Highest education level: Not on file  ?Occupational History  ? Occupation: botanist  ?Tobacco Use  ? Smoking status: Never  ? Smokeless tobacco: Never  ?Vaping Use  ? Vaping Use: Never used  ?Substance and Sexual Activity  ? Alcohol use: Yes  ?  Alcohol/week: 7.0 standard drinks  ?  Types: 7 Glasses of wine per week  ?  Comment: 4 drinks a week  ? Drug use: No  ? Sexual activity: Yes  ?  Partners: Male  ?Other Topics Concern  ? Not on file  ?Social History Narrative  ? Married. Education: [employer]. Exercise: Yes.   ? ?Social Determinants of Health  ? ?Financial Resource Strain: Low Risk   ? Difficulty of Paying Living Expenses: Not hard at all  ?Food Insecurity: No Food Insecurity  ? Worried About Charity fundraiser in the Last Year: Never true  ? Ran Out of Food in the Last Year: Never true  ?Transportation Needs: No Transportation Needs  ? Lack of Transportation (Medical): No  ? Lack of Transportation (Non-Medical): No  ?Physical Activity: Sufficiently Active  ? Days of Exercise per Week: 5 days  ? Minutes of Exercise per Session: 60 min  ?Stress: No Stress Concern Present  ? Feeling of Stress : Not at all  ?Social Connections: Moderately Integrated  ? Frequency of Communication with Friends and Family: Three times a week  ? Frequency of Social Gatherings with Friends and Family: Three times a week  ? Attends Religious Services: Never  ? Active Member of Clubs or Organizations: Yes  ? Attends Archivist Meetings: 1 to 4 times per year  ? Marital Status: Married  ? ? ?Tobacco Counseling ?Counseling given: Not Answered ? ? ?Clinical Intake: ? ?Pre-visit preparation completed: Yes ? ?Pain : No/denies pain ? ?  ? ?Nutritional Risks: None ?Diabetes: No ? ?How often do you need to have someone help you when you read instructions, pamphlets, or other written materials from your doctor or pharmacy?: 1 - Never ?What is the last grade level you completed in school?: college ? ?Diabetic?no  ? ?Interpreter Needed?: No ? ?Information entered by :: [email or web address] ? ? ?Activities of Daily Living ? ?  10/16/2021  ?  8:54 AM  ?In your present state of health, do you have any difficulty performing the following activities:  ?Hearing? 0  ?Vision? 0  ?Difficulty concentrating or making decisions? 0  ?Walking or climbing stairs? 0  ?Dressing or bathing? 0  ?Doing errands, shopping? 0  ?Preparing Food and eating ? N  ?Using the Toilet? N  ?In the past six months, have you accidently leaked urine? N  ?Do you have problems with loss of bowel  control? N  ?Managing your Medications? N  ?Managing your Finances? N  ?Housekeeping or managing your Housekeeping? N  ? ? ?Patient Care Team: ?Midge Minium, MD as PCP - General (Family Medicine) ?[facility] as Audiologist (Audiology) ? ?Indicate any recent Medical Services you may have received from other than [facility] providers in the past year (date may be approximate). ? ?   ?Assessment:  ? This is a routine wellness examination for Adrienna. ? ?Hearing/Vision screen ?Vision Screening - Comments:: Annual eye exams wears glasses  ? ?Dietary issues and exercise activities discussed: ?Current Exercise Habits: Home exercise routine, Type  of exercise: walking;strength training/weights, Time (Minutes): 60, Frequency (Times/Week): 5, Weekly Exercise (Minutes/Week): 300, Intensity: Mild, Exercise limited by: None identified ? ? Goals Addressed   ? ?  ?  ?  ?  ? This Visit's Progress  ?  Patient Stated   On track  ?  Maintain current health ?  ? ?  ? ?Depression Screen ? ?  10/16/2021  ?  8:54 AM 10/16/2021  ?  8:49 AM 06/25/2021  ?  1:35 PM 05/13/2021  ?  9:03 AM 03/04/2020  ?  8:21 AM 12/04/2019  ? 10:03 AM 02/14/2019  ? 11:03 AM  ?PHQ 2/9 Scores  ?PHQ - 2 Score 0 0 0 0 0 0 0  ?PHQ- 9 Score   0 1  0 0  ?  ?Fall Risk ? ?  10/16/2021  ?  8:51 AM 06/25/2021  ?  1:35 PM 05/13/2021  ?  9:03 AM 03/04/2020  ?  8:20 AM 12/04/2019  ? 10:03 AM  ?Fall Risk   ?Falls in the past year? 0 0 0 0 0  ?Number falls in past yr: 0 0  0 0  ?Injury with Fall? 0 0  0 0  ?Risk for fall due to :  No Fall Risks No Fall Risks    ?Follow up Falls evaluation completed Falls evaluation completed Falls evaluation completed Falls prevention discussed Falls evaluation completed  ? ? ?FALL RISK PREVENTION PERTAINING TO THE HOME: ? ?Any stairs in or around the home? No  ?If so, are there any without handrails? No  ?Home free of loose throw rugs in walkways, pet beds, electrical cords, etc? Yes  ?Adequate lighting in your home to reduce risk  of falls? Yes  ? ?ASSISTIVE DEVICES UTILIZED TO PREVENT FALLS: ? ?Life alert? No  ?Use of a cane, walker or w/c? No  ?Grab bars in the bathroom? No  ?Shower chair or bench in shower? No  ?Elevated toilet s

## 2021-11-05 DIAGNOSIS — M9905 Segmental and somatic dysfunction of pelvic region: Secondary | ICD-10-CM | POA: Diagnosis not present

## 2021-11-05 DIAGNOSIS — M9906 Segmental and somatic dysfunction of lower extremity: Secondary | ICD-10-CM | POA: Diagnosis not present

## 2021-11-05 DIAGNOSIS — M25561 Pain in right knee: Secondary | ICD-10-CM | POA: Diagnosis not present

## 2021-11-05 DIAGNOSIS — Q7290 Unspecified reduction defect of unspecified lower limb: Secondary | ICD-10-CM | POA: Diagnosis not present

## 2021-11-05 DIAGNOSIS — M9903 Segmental and somatic dysfunction of lumbar region: Secondary | ICD-10-CM | POA: Diagnosis not present

## 2021-11-05 DIAGNOSIS — H903 Sensorineural hearing loss, bilateral: Secondary | ICD-10-CM | POA: Diagnosis not present

## 2021-11-05 DIAGNOSIS — M25562 Pain in left knee: Secondary | ICD-10-CM | POA: Diagnosis not present

## 2022-01-06 ENCOUNTER — Ambulatory Visit: Payer: PPO | Admitting: Dermatology

## 2022-01-20 DIAGNOSIS — Z01419 Encounter for gynecological examination (general) (routine) without abnormal findings: Secondary | ICD-10-CM | POA: Diagnosis not present

## 2022-01-20 LAB — HM PAP SMEAR

## 2022-01-21 ENCOUNTER — Other Ambulatory Visit: Payer: Self-pay | Admitting: Obstetrics and Gynecology

## 2022-01-21 DIAGNOSIS — E2839 Other primary ovarian failure: Secondary | ICD-10-CM

## 2022-01-22 ENCOUNTER — Encounter: Payer: Self-pay | Admitting: Family Medicine

## 2022-02-06 DIAGNOSIS — M25551 Pain in right hip: Secondary | ICD-10-CM | POA: Diagnosis not present

## 2022-02-06 DIAGNOSIS — M9905 Segmental and somatic dysfunction of pelvic region: Secondary | ICD-10-CM | POA: Diagnosis not present

## 2022-02-06 DIAGNOSIS — M9903 Segmental and somatic dysfunction of lumbar region: Secondary | ICD-10-CM | POA: Diagnosis not present

## 2022-02-06 DIAGNOSIS — M9906 Segmental and somatic dysfunction of lower extremity: Secondary | ICD-10-CM | POA: Diagnosis not present

## 2022-02-06 DIAGNOSIS — M9908 Segmental and somatic dysfunction of rib cage: Secondary | ICD-10-CM | POA: Diagnosis not present

## 2022-03-06 DIAGNOSIS — M9906 Segmental and somatic dysfunction of lower extremity: Secondary | ICD-10-CM | POA: Diagnosis not present

## 2022-03-06 DIAGNOSIS — M9905 Segmental and somatic dysfunction of pelvic region: Secondary | ICD-10-CM | POA: Diagnosis not present

## 2022-03-06 DIAGNOSIS — M25551 Pain in right hip: Secondary | ICD-10-CM | POA: Diagnosis not present

## 2022-03-06 DIAGNOSIS — M9904 Segmental and somatic dysfunction of sacral region: Secondary | ICD-10-CM | POA: Diagnosis not present

## 2022-03-06 DIAGNOSIS — M545 Low back pain, unspecified: Secondary | ICD-10-CM | POA: Diagnosis not present

## 2022-04-28 ENCOUNTER — Ambulatory Visit (INDEPENDENT_AMBULATORY_CARE_PROVIDER_SITE_OTHER): Payer: PPO | Admitting: Family

## 2022-04-28 ENCOUNTER — Encounter: Payer: Self-pay | Admitting: Family

## 2022-04-28 VITALS — BP 130/74 | HR 52 | Temp 98.1°F | Ht 64.0 in | Wt 131.2 lb

## 2022-04-28 DIAGNOSIS — R300 Dysuria: Secondary | ICD-10-CM

## 2022-04-28 DIAGNOSIS — R8290 Unspecified abnormal findings in urine: Secondary | ICD-10-CM

## 2022-04-28 LAB — POCT URINALYSIS DIP (MANUAL ENTRY)
Glucose, UA: NEGATIVE mg/dL
Ketones, POC UA: NEGATIVE mg/dL
Nitrite, UA: NEGATIVE
Spec Grav, UA: 1.01 (ref 1.010–1.025)
Urobilinogen, UA: 0.2 E.U./dL
pH, UA: 7.5 (ref 5.0–8.0)

## 2022-04-28 NOTE — Progress Notes (Signed)
   Acute Office Visit  Subjective:     Patient ID: Nicole Benson, female    DOB: 1950-07-13, 71 y.o.   MRN: [id number]  Chief Complaint  Patient presents with  . foul smelling unrine     Pt states she has had foul smelling urine along with burning with urinating     HPI Patient is in today with c/o urinary frequency, odor, and mild burning sensation. She denies being sexually active. No vaginal discharge. Admits to changing soaps recently. No external irritation.   Review of Systems  Constitutional:  Negative for chills and fever.  Respiratory: Negative.    Gastrointestinal: Negative.  Negative for nausea and vomiting.  Genitourinary:  Positive for dysuria, frequency and urgency. Negative for flank pain and hematuria.  Neurological: Negative.   Psychiatric/Behavioral: Negative.    All other systems reviewed and are negative.      Objective:    BP 130/74   Pulse (!) 52   Temp 98.1 F (36.7 C)   Ht '5\' 4"'$  (1.626 m)   Wt 131 lb 3.2 oz (59.5 kg)   SpO2 97%   BMI 22.52 kg/m    Physical Exam Vitals and nursing note reviewed.  Constitutional:      Appearance: Normal appearance.  Cardiovascular:     Rate and Rhythm: Normal rate and regular rhythm.     Pulses: Normal pulses.     Heart sounds: Normal heart sounds.  Pulmonary:     Effort: Pulmonary effort is normal.     Breath sounds: Normal breath sounds.  Abdominal:     General: Abdomen is flat. Bowel sounds are normal.     Palpations: Abdomen is soft.  Musculoskeletal:        General: Normal range of motion.  Skin:    General: Skin is warm and dry.  Neurological:     Mental Status: She is alert.  Psychiatric:        Mood and Affect: Mood normal.        Behavior: Behavior normal.   No results found for any visits on 04/28/22.      Assessment & Plan:   Problem List Items Addressed This Visit   None Visit Diagnoses     Burning with urination    -  Primary   Relevant Orders   POCT urinalysis dipstick    Foul smelling urine           Urine culture obtained. At patient's request, will treat UTI upon receipt of preliminary results. Declines antibiotic today. Drink plenty of water and cranberry juice. F/u pending labs.   No follow-ups on file.  Kennyth Arnold, FNP

## 2022-04-29 ENCOUNTER — Telehealth: Payer: Self-pay | Admitting: Family Medicine

## 2022-04-29 ENCOUNTER — Encounter: Payer: Self-pay | Admitting: Family Medicine

## 2022-04-29 MED ORDER — CEPHALEXIN 500 MG PO CAPS: 500.0000 mg | ORAL_CAPSULE | Freq: Two times a day (BID) | ORAL | 0 refills | Status: DC

## 2022-04-29 NOTE — Telephone Encounter (Signed)
Ok to send Cephalexin '500mg'$  BID x5 days (#10, no refills) for likely UTI

## 2022-04-29 NOTE — Telephone Encounter (Signed)
Caller name: [name]  On DPR?: Yes  Call back number: [phone] (mobile)  Provider they see: Midge Minium, MD  Reason for call:  Pt have some concerns about her labs that she received on my chart. Pt need some more understanding about her lab results. Pt also want to know about her UTI results from yesterday.

## 2022-04-29 NOTE — Telephone Encounter (Signed)
Sent Cephalexin 500 mg BID for 5 days as requested by provider   Called and informed patient

## 2022-04-29 NOTE — Telephone Encounter (Signed)
Spoke w/ pt and she states that she told Nicole Benson yesterday she wanted to wait on the culture to come back before starting medication  for an UTI but she has seen the dipstick results and now wants to start a medication . Urine culture is not back at this time

## 2022-05-01 LAB — URINE CULTURE
MICRO NUMBER:: 14186983
SPECIMEN QUALITY:: ADEQUATE

## 2022-05-04 NOTE — Telephone Encounter (Signed)
Should I have her drop off sample for retest?

## 2022-05-06 ENCOUNTER — Other Ambulatory Visit: Payer: PPO

## 2022-05-06 ENCOUNTER — Telehealth: Payer: Self-pay

## 2022-05-06 ENCOUNTER — Other Ambulatory Visit (INDEPENDENT_AMBULATORY_CARE_PROVIDER_SITE_OTHER): Payer: PPO

## 2022-05-06 DIAGNOSIS — R300 Dysuria: Secondary | ICD-10-CM

## 2022-05-06 LAB — POCT URINALYSIS DIPSTICK
Glucose, UA: NEGATIVE
Leukocytes, UA: NEGATIVE
Protein, UA: NEGATIVE
Spec Grav, UA: 1.02 (ref 1.010–1.025)
Urobilinogen, UA: 0.2 E.U./dL
pH, UA: 7 (ref 5.0–8.0)

## 2022-05-06 NOTE — Telephone Encounter (Signed)
-----   Message from Midge Minium, MD sent at 05/06/2022  9:06 AM EST ----- Urine looks good today- no evidence of infection.  We will send it for culture to be sure, but no need for antibiotics at this time

## 2022-05-06 NOTE — Telephone Encounter (Signed)
Informed pt of lab results  

## 2022-05-07 LAB — URINE CULTURE
MICRO NUMBER:: 14224838
Result:: NO GROWTH
SPECIMEN QUALITY:: ADEQUATE

## 2022-05-14 ENCOUNTER — Encounter: Payer: Self-pay | Admitting: Family Medicine

## 2022-05-14 ENCOUNTER — Ambulatory Visit (INDEPENDENT_AMBULATORY_CARE_PROVIDER_SITE_OTHER): Payer: PPO | Admitting: Family Medicine

## 2022-05-14 VITALS — BP 132/78 | HR 47 | Temp 97.5°F | Resp 16 | Ht 64.0 in | Wt 130.0 lb

## 2022-05-14 DIAGNOSIS — Z0000 Encounter for general adult medical examination without abnormal findings: Secondary | ICD-10-CM | POA: Diagnosis not present

## 2022-05-14 DIAGNOSIS — E785 Hyperlipidemia, unspecified: Secondary | ICD-10-CM

## 2022-05-14 LAB — CBC WITH DIFFERENTIAL/PLATELET
Basophils Absolute: 0 10*3/uL (ref 0.0–0.1)
Basophils Relative: 0.7 % (ref 0.0–3.0)
Eosinophils Absolute: 0.1 10*3/uL (ref 0.0–0.7)
Eosinophils Relative: 2.5 % (ref 0.0–5.0)
HCT: 43.7 % (ref 36.0–46.0)
Hemoglobin: 14.5 g/dL (ref 12.0–15.0)
Lymphocytes Relative: 23.5 % (ref 12.0–46.0)
Lymphs Abs: 1.2 10*3/uL (ref 0.7–4.0)
MCHC: 33.2 g/dL (ref 30.0–36.0)
MCV: 90 fl (ref 78.0–100.0)
Monocytes Absolute: 0.4 10*3/uL (ref 0.1–1.0)
Monocytes Relative: 8.2 % (ref 3.0–12.0)
Neutro Abs: 3.2 10*3/uL (ref 1.4–7.7)
Neutrophils Relative %: 65.1 % (ref 43.0–77.0)
Platelets: 151 10*3/uL (ref 150.0–400.0)
RBC: 4.86 Mil/uL (ref 3.87–5.11)
RDW: 13.3 % (ref 11.5–15.5)
WBC: 5 10*3/uL (ref 4.0–10.5)

## 2022-05-14 LAB — HEPATIC FUNCTION PANEL
ALT: 20 U/L (ref 0–35)
AST: 24 U/L (ref 0–37)
Albumin: 4.3 g/dL (ref 3.5–5.2)
Alkaline Phosphatase: 66 U/L (ref 39–117)
Bilirubin, Direct: 0.2 mg/dL (ref 0.0–0.3)
Total Bilirubin: 1 mg/dL (ref 0.2–1.2)
Total Protein: 6.9 g/dL (ref 6.0–8.3)

## 2022-05-14 LAB — LIPID PANEL
Cholesterol: 233 mg/dL — ABNORMAL HIGH (ref 0–200)
HDL: 88.6 mg/dL (ref >39.00)
LDL Cholesterol: 131 mg/dL — ABNORMAL HIGH (ref 0–99)
NonHDL: 144.19
Total CHOL/HDL Ratio: 3
Triglycerides: 68 mg/dL (ref 0.0–149.0)
VLDL: 13.6 mg/dL (ref 0.0–40.0)

## 2022-05-14 LAB — BASIC METABOLIC PANEL
BUN: 20 mg/dL (ref 6–23)
CO2: 31 mEq/L (ref 19–32)
Calcium: 9.1 mg/dL (ref 8.4–10.5)
Chloride: 102 mEq/L (ref 96–112)
Creatinine, Ser: 0.79 mg/dL (ref 0.40–1.20)
GFR: 75.74 mL/min (ref >60.00)
Glucose, Bld: 79 mg/dL (ref 70–99)
Potassium: 4.5 mEq/L (ref 3.5–5.1)
Sodium: 139 mEq/L (ref 135–145)

## 2022-05-14 LAB — TSH: TSH: 0.64 u[IU]/mL (ref 0.35–5.50)

## 2022-05-14 NOTE — Assessment & Plan Note (Signed)
Pt has been able to control w/ diet and exercise.  Check labs and determine if medication is needed.

## 2022-05-14 NOTE — Patient Instructions (Signed)
Follow up in 1 year or as needed We'll notify you of your lab results and make any changes if needed Keep up the good work on healthy diet and regular exercise- you look fantastic!!! Use Miconazole cream (available OTC) on the raw areas twice daily until feeling better Call with any questions or concerns Stay Safe!  Stay Healthy! Happy Holidays!!!

## 2022-05-14 NOTE — Progress Notes (Signed)
   Subjective:    Patient ID: Nicole Benson, female    DOB: Jun 20, 1950, 71 y.o.   MRN: [id number]  HPI CPE- UTD on mammo, colonoscopy, Tdap, PNA, flu  Patient Care Team    Relationship Specialty Notifications Start End  Midge Minium, MD PCP - General Family Medicine  12/10/16   [facility] Audiologist Audiology  08/05/17     Health Maintenance  Topic Date Due   COVID-19 Vaccine (6 - 2023-24 season) 05/14/2022 (Originally 02/13/2022)   MAMMOGRAM  07/02/2022   Medicare Annual Wellness ([facility])  10/17/2022   COLONOSCOPY (Pts 45-72yr Insurance coverage will need to be confirmed)  02/19/2027   DTaP/Tdap/Td (3 - Td or Tdap) 09/26/2029   Pneumonia Vaccine 70 Years old  Completed   INFLUENZA VACCINE  Completed   DEXA SCAN  Completed   Hepatitis C Screening  Completed   Zoster Vaccines- Shingrix  Completed   HPV VACCINES  Aged Out      Review of Systems Patient reports no vision/ hearing changes, adenopathy,fever, weight change,  persistant/recurrent hoarseness , swallowing issues, chest pain, palpitations, edema, persistant/recurrent cough, hemoptysis, dyspnea (rest/exertional/paroxysmal nocturnal), gastrointestinal bleeding (melena, rectal bleeding), abdominal pain, significant heartburn, bowel changes, GU symptoms (dysuria, hematuria, incontinence), Gyn symptoms (abnormal  bleeding, pain),  syncope, focal weakness, memory loss, numbness & tingling, skin/hair/nail changes, abnormal bruising or bleeding, anxiety, or depression.     Objective:   Physical Exam General Appearance:    Alert, cooperative, no distress, appears stated age  Head:    Normocephalic, without obvious abnormality, atraumatic  Eyes:    PERRL, conjunctiva/corneas clear, EOM's intact both eyes  Ears:    Normal TM's and external ear canals, both ears  Nose:   Nares normal, septum midline, mucosa normal, no drainage    or sinus tenderness  Throat:   Lips, mucosa, and tongue normal; teeth and  gums normal  Neck:   Supple, symmetrical, trachea midline, no adenopathy;    Thyroid: no enlargement/tenderness/nodules  Back:     Symmetric, no curvature, ROM normal, no CVA tenderness  Lungs:     Clear to auscultation bilaterally, respirations unlabored  Chest Wall:    No tenderness or deformity   Heart:    Regular rate and rhythm, S1 and S2 normal, no murmur, rub   or gallop  Breast Exam:    Deferred to GYN  Abdomen:     Soft, non-tender, bowel sounds active all four quadrants,    no masses, no organomegaly  Genitalia:    Deferred to GYN  Rectal:    Extremities:   Extremities normal, atraumatic, no cyanosis or edema  Pulses:   2+ and symmetric all extremities  Skin:   Skin color, texture, turgor normal, no rashes or lesions  Lymph nodes:   Cervical, supraclavicular, and axillary nodes normal  Neurologic:   CNII-XII intact, normal strength, sensation and reflexes    throughout          Assessment & Plan:

## 2022-05-14 NOTE — Assessment & Plan Note (Signed)
Pt's PE WNL.  UTD on mammo, colonoscopy, immunizations.  Check labs.  Anticipatory guidance provided.

## 2022-05-15 ENCOUNTER — Telehealth: Payer: Self-pay

## 2022-05-15 NOTE — Telephone Encounter (Signed)
Pt called back about lab results.  

## 2022-05-15 NOTE — Telephone Encounter (Signed)
-----   Message from Midge Minium, MD sent at 05/15/2022  7:38 AM EST ----- Labs look great!  No changes at this time  Total cholesterol continues to appear misleading b/c your HDL (good cholesterol) is SO good!  Keep up the good work!

## 2022-05-15 NOTE — Telephone Encounter (Signed)
Spoke to patient's husband and gave him the results

## 2022-06-11 ENCOUNTER — Telehealth: Payer: Self-pay

## 2022-06-11 NOTE — Telephone Encounter (Signed)
Pt  is calling and asking if we can place a referral to GI for IBS . If that is ok please let me know thank you

## 2022-06-12 NOTE — Telephone Encounter (Signed)
Pt was receptive to speaking with Dr Loletha Carrow office however did note she felt his recriminations were not helpful and thought maybe a second opinion or even seeing a nutritionist as she can't seem to get a handle on what would be causing her episodes of GI distress she stated she would call Dr Loletha Carrow office and see if she could see someone else see if it is a better fit while she waits to hear your thoughts

## 2022-06-12 NOTE — Telephone Encounter (Signed)
It appears she has been seen by Dr. Loletha Carrow in the past, and was evaluated in February of last year by Alonza Bogus, PA-C.  Should not need referral, would recommend she call their office for follow-up.  Let us know if they do request a referral.

## 2022-06-16 NOTE — Telephone Encounter (Signed)
I would first start w/ Dr Corena Pilgrim office.  They need to know that the recommendations have not been helpful and that she is still struggling.  If after that appointment she still feels that it is not a good fit, we can certainly work on a 2nd opinion (although the GI offices are often weird about this).  But they do deserve the chance to make things better

## 2022-06-16 NOTE — Telephone Encounter (Signed)
Informed pt of Dr Birdie Riddle recommendation in the above message

## 2022-06-23 LAB — HM MAMMOGRAPHY

## 2022-07-03 DIAGNOSIS — Z1231 Encounter for screening mammogram for malignant neoplasm of breast: Secondary | ICD-10-CM | POA: Diagnosis not present

## 2022-07-17 ENCOUNTER — Ambulatory Visit
Admission: RE | Admit: 2022-07-17 | Discharge: 2022-07-17 | Disposition: A | Discharge status: Home / Self Care | Payer: PPO | Source: Ambulatory Visit | Attending: Obstetrics and Gynecology | Admitting: Obstetrics and Gynecology

## 2022-07-17 DIAGNOSIS — E2839 Other primary ovarian failure: Secondary | ICD-10-CM

## 2022-07-17 DIAGNOSIS — Z780 Asymptomatic menopausal state: Secondary | ICD-10-CM | POA: Diagnosis not present

## 2022-07-17 DIAGNOSIS — M8589 Other specified disorders of bone density and structure, multiple sites: Secondary | ICD-10-CM | POA: Diagnosis not present

## 2022-07-28 ENCOUNTER — Encounter: Payer: Self-pay | Admitting: Gastroenterology

## 2022-07-28 ENCOUNTER — Ambulatory Visit: Payer: PPO | Admitting: Gastroenterology

## 2022-07-28 VITALS — BP 118/68 | HR 64 | Ht 63.0 in | Wt 131.6 lb

## 2022-07-28 DIAGNOSIS — K580 Irritable bowel syndrome with diarrhea: Secondary | ICD-10-CM | POA: Diagnosis not present

## 2022-07-28 NOTE — Patient Instructions (Signed)
If you are age 71 or older, your body mass index should be between 23-30. Your Body mass index is 23.31 kg/m. If this is out of the aforementioned range listed, please consider follow up with your Primary Care Provider.  If you are age 29 or younger, your body mass index should be between 19-25. Your Body mass index is 23.31 kg/m. If this is out of the aformentioned range listed, please consider follow up with your Primary Care Provider.  Please follow Low fodmap diet.    The [facility] providers would like to encourage you to use [facility] to communicate with providers for non-urgent requests or questions.  Due to long hold times on the telephone, sending your provider a message by [facility] may be a faster and more efficient way to get a response.  Please allow 48 business hours for a response.  Please remember that this is for non-urgent requests.   It was a pleasure to see you today!  Thank you for trusting me with your gastrointestinal care!    Alonza Bogus, PA-C

## 2022-07-28 NOTE — Progress Notes (Signed)
07/28/2022 EMBRI FAZEL [id number] [birth year]   HISTORY OF PRESENT ILLNESS: This is a 72 year old female who is a patient Dr. Corena Pilgrim with limited past medical history.  She had a colonoscopy in September 2018 that showed redundant colon but was otherwise normal.  She was last seen here in February 2022 with some bowel habit issues.  She returns here with the same.  Believes that she probably has IBS that may be food related.  She says that she will have episodes where she will have 4-5 bouts of diarrhea within a couple hours.  Most of the time otherwise she has normal bowel movements.  She does get a lot of gas that causes a lot of uncomfortable feeling in her lower abdomen.  We had previously discussed trying Benefiber, but she was not able to do that even in very low amounts as it caused more increase in gas and bloating.  She says that she has cut out coffee, has cut down on alcohol, and has cut out some/most dairy.  Has changed some other things in her diet as well and thinks that those things have helped, but is looking for further guidance and work guards to dietary recommendations, etc.   Past Medical History:  Diagnosis Date   Chronic lower back pain    Heart murmur    Past Surgical History:  Procedure Laterality Date   ABLATION     Radio frequency lower back   COLONOSCOPY     COLONOSCOPY W/ BIOPSIES     hyperplastic polyp   KNEE ARTHROSCOPY Left 12/06/2019   KNEE ARTHROSCOPY Right 2016   POLYPECTOMY      reports that she has never smoked. She has never used smokeless tobacco. She reports current alcohol use of about 7.0 standard drinks of alcohol per week. She reports that she does not use drugs. family history includes Bone cancer in her maternal grandmother; Colon cancer in her maternal grandfather; Hypertension in her mother; Throat cancer in her father. Allergies  Allergen Reactions   Sulfonamide Derivatives Rash      Outpatient Encounter Medications as of  07/28/2022  Medication Sig   Calcium Carb-Cholecalciferol (OYSTER SHELL CALCIUM/VITAMIN D) 500-5 MG-MCG PACK Take by mouth.   Calcium Carbonate-Vitamin D (CALCIUM 500 + D PO) Take by mouth daily.   meloxicam (MOBIC) 15 MG tablet meloxicam 15 mg tablet  TAKE 1 TABLET BY MOUTH EVERY DAY   XIIDRA 5 % SOLN  (Patient not taking: Reported on 07/28/2022)   No facility-administered encounter medications on file as of 07/28/2022.     REVIEW OF SYSTEMS  : All other systems reviewed and negative except where noted in the History of Present Illness.   PHYSICAL EXAM: BP 118/68   Pulse 64   Ht 5' 3"$  (1.6 m)   Wt 131 lb 9.6 oz (59.7 kg)   SpO2 98%   BMI 23.31 kg/m  General: Well developed white female in no acute distress Head: Normocephalic and atraumatic Eyes:  Sclerae anicteric, conjunctiva pink. Ears: Normal auditory acuity Lungs: Clear throughout to auscultation; no W/R/R. Heart: Regular rate and rhythm; no M/R/G. Abdomen: Soft, non-distended.  BS present.  Non-tender. Musculoskeletal: Symmetrical with no gross deformities  Skin: No lesions on visible extremities Extremities: No edema  Neurological: Alert oriented x 4, grossly non-focal Psychological:  Alert and cooperative. Normal mood and affect  ASSESSMENT AND PLAN: *IBS with diarrhea: Random intermittent episodes of diarrhea.  Has identified certain foods that may be contributing, but wanted  to discuss further in regards to dietary recommendations, etc.  We discussed FODMAP diet and she was given literature on this.  She is not able to do the Benefiber even at low doses as it causes her a lot of gas.  She is going to try to do the FODMAP diet on her own, but may consider dietary/nutrition consult.  If she decides to proceed with that then she will call us to request referral.  Can use Imodium prn.   CC:  Midge Minium, MD

## 2022-07-28 NOTE — Progress Notes (Signed)
____________________________________________________________  Attending physician addendum:  Thank you for sending this case to me. I have reviewed the entire note and agree with the plan.  I will also send her a portal message with some additional dietary recommendations.  Wilfrid Lund, MD  ____________________________________________________________

## 2022-08-13 DIAGNOSIS — L570 Actinic keratosis: Secondary | ICD-10-CM | POA: Diagnosis not present

## 2022-08-13 DIAGNOSIS — L821 Other seborrheic keratosis: Secondary | ICD-10-CM | POA: Diagnosis not present

## 2022-08-13 DIAGNOSIS — D225 Melanocytic nevi of trunk: Secondary | ICD-10-CM | POA: Diagnosis not present

## 2022-08-13 DIAGNOSIS — L814 Other melanin hyperpigmentation: Secondary | ICD-10-CM | POA: Diagnosis not present

## 2022-08-13 DIAGNOSIS — Z7189 Other specified counseling: Secondary | ICD-10-CM | POA: Diagnosis not present

## 2022-08-13 DIAGNOSIS — L989 Disorder of the skin and subcutaneous tissue, unspecified: Secondary | ICD-10-CM | POA: Diagnosis not present

## 2022-08-13 DIAGNOSIS — L609 Nail disorder, unspecified: Secondary | ICD-10-CM | POA: Diagnosis not present

## 2022-08-13 DIAGNOSIS — L918 Other hypertrophic disorders of the skin: Secondary | ICD-10-CM | POA: Diagnosis not present

## 2022-08-31 DIAGNOSIS — M9904 Segmental and somatic dysfunction of sacral region: Secondary | ICD-10-CM | POA: Diagnosis not present

## 2022-08-31 DIAGNOSIS — M9905 Segmental and somatic dysfunction of pelvic region: Secondary | ICD-10-CM | POA: Diagnosis not present

## 2022-08-31 DIAGNOSIS — M9908 Segmental and somatic dysfunction of rib cage: Secondary | ICD-10-CM | POA: Diagnosis not present

## 2022-08-31 DIAGNOSIS — M9906 Segmental and somatic dysfunction of lower extremity: Secondary | ICD-10-CM | POA: Diagnosis not present

## 2022-08-31 DIAGNOSIS — M9903 Segmental and somatic dysfunction of lumbar region: Secondary | ICD-10-CM | POA: Diagnosis not present

## 2022-08-31 DIAGNOSIS — M25551 Pain in right hip: Secondary | ICD-10-CM | POA: Diagnosis not present

## 2022-09-30 ENCOUNTER — Telehealth: Payer: Self-pay | Admitting: Family Medicine

## 2022-09-30 NOTE — Telephone Encounter (Signed)
Contacted Nicole Benson to schedule their annual wellness visit. Appointment made for 10/01/2022.  Thank you,  [facility] Support [facility] Direct dial  [phone]

## 2022-10-01 ENCOUNTER — Ambulatory Visit (INDEPENDENT_AMBULATORY_CARE_PROVIDER_SITE_OTHER): Payer: PPO | Admitting: *Deleted

## 2022-10-01 DIAGNOSIS — Z0000 Encounter for general adult medical examination without abnormal findings: Secondary | ICD-10-CM | POA: Diagnosis not present

## 2022-10-01 NOTE — Patient Instructions (Signed)
Nicole Benson , Thank you for taking time to come for your Medicare Wellness Visit. I appreciate your ongoing commitment to your health goals. Please review the following plan we discussed and let me know if I can assist you in the future.   Screening recommendations/referrals: Colonoscopy: up to date Mammogram: ordered Bone Density: up to date Recommended yearly ophthalmology/optometry visit for glaucoma screening and checkup Recommended yearly dental visit for hygiene and checkup  Vaccinations: Influenza vaccine: up to date Pneumococcal vaccine: up to date Tdap vaccine: up to date Shingles vaccine: up to date    Advanced directives: not on file     Preventive Care 65 Years and Older, Female Preventive care refers to lifestyle choices and visits with your health care provider that can promote health and wellness. What does preventive care include? A yearly physical exam. This is also called an annual well check. Dental exams once or twice a year. Routine eye exams. Ask your health care provider how often you should have your eyes checked. Personal lifestyle choices, including: Daily care of your teeth and gums. Regular physical activity. Eating a healthy diet. Avoiding tobacco and drug use. Limiting alcohol use. Practicing safe sex. Taking low-dose aspirin every day. Taking vitamin and mineral supplements as recommended by your health care provider. What happens during an annual well check? The services and screenings done by your health care provider during your annual well check will depend on your age, overall health, lifestyle risk factors, and family history of disease. Counseling  Your health care provider may ask you questions about your: Alcohol use. Tobacco use. Drug use. Emotional well-being. Home and relationship well-being. Sexual activity. Eating habits. History of falls. Memory and ability to understand (cognition). Work and work Astronomer. Reproductive  health. Screening  You may have the following tests or measurements: Height, weight, and BMI. Blood pressure. Lipid and cholesterol levels. These may be checked every 5 years, or more frequently if you are over 75 years old. Skin check. Lung cancer screening. You may have this screening every year starting at age 97 if you have a 30-pack-year history of smoking and currently smoke or have quit within the past 15 years. Fecal occult blood test (FOBT) of the stool. You may have this test every year starting at age 36. Flexible sigmoidoscopy or colonoscopy. You may have a sigmoidoscopy every 5 years or a colonoscopy every 10 years starting at age 44. Hepatitis C blood test. Hepatitis B blood test. Sexually transmitted disease (STD) testing. Diabetes screening. This is done by checking your blood sugar (glucose) after you have not eaten for a while (fasting). You may have this done every 1-3 years. Bone density scan. This is done to screen for osteoporosis. You may have this done starting at age 44. Mammogram. This may be done every 1-2 years. Talk to your health care provider about how often you should have regular mammograms. Talk with your health care provider about your test results, treatment options, and if necessary, the need for more tests. Vaccines  Your health care provider may recommend certain vaccines, such as: Influenza vaccine. This is recommended every year. Tetanus, diphtheria, and acellular pertussis (Tdap, Td) vaccine. You may need a Td booster every 10 years. Zoster vaccine. You may need this after age 75. Pneumococcal 13-valent conjugate (PCV13) vaccine. One dose is recommended after age 1. Pneumococcal polysaccharide (PPSV23) vaccine. One dose is recommended after age 83. Talk to your health care provider about which screenings and vaccines you need and how  often you need them. This information is not intended to replace advice given to you by your health care provider.  Make sure you discuss any questions you have with your health care provider. Document Released: 06/28/2015 Document Revised: 02/19/2016 Document Reviewed: 04/02/2015 Elsevier Interactive Patient Education  2017 [employer].  Fall Prevention in the Home Falls can cause injuries. They can happen to people of all ages. There are many things you can do to make your home safe and to help prevent falls. What can I do on the outside of my home? Regularly fix the edges of walkways and driveways and fix any cracks. Remove anything that might make you trip as you walk through a door, such as a raised step or threshold. Trim any bushes or trees on the path to your home. Use bright outdoor lighting. Clear any walking paths of anything that might make someone trip, such as rocks or tools. Regularly check to see if handrails are loose or broken. Make sure that both sides of any steps have handrails. Any raised decks and porches should have guardrails on the edges. Have any leaves, snow, or ice cleared regularly. Use sand or salt on walking paths during winter. Clean up any spills in your garage right away. This includes oil or grease spills. What can I do in the bathroom? Use night lights. Install grab bars by the toilet and in the tub and shower. Do not use towel bars as grab bars. Use non-skid mats or decals in the tub or shower. If you need to sit down in the shower, use a plastic, non-slip stool. Keep the floor dry. Clean up any water that spills on the floor as soon as it happens. Remove soap buildup in the tub or shower regularly. Attach bath mats securely with double-sided non-slip rug tape. Do not have throw rugs and other things on the floor that can make you trip. What can I do in the bedroom? Use night lights. Make sure that you have a light by your bed that is easy to reach. Do not use any sheets or blankets that are too big for your bed. They should not hang down onto the floor. Have a  firm chair that has side arms. You can use this for support while you get dressed. Do not have throw rugs and other things on the floor that can make you trip. What can I do in the kitchen? Clean up any spills right away. Avoid walking on wet floors. Keep items that you use a lot in easy-to-reach places. If you need to reach something above you, use a strong step stool that has a grab bar. Keep electrical cords out of the way. Do not use floor polish or wax that makes floors slippery. If you must use wax, use non-skid floor wax. Do not have throw rugs and other things on the floor that can make you trip. What can I do with my stairs? Do not leave any items on the stairs. Make sure that there are handrails on both sides of the stairs and use them. Fix handrails that are broken or loose. Make sure that handrails are as long as the stairways. Check any carpeting to make sure that it is firmly attached to the stairs. Fix any carpet that is loose or worn. Avoid having throw rugs at the top or bottom of the stairs. If you do have throw rugs, attach them to the floor with carpet tape. Make sure that you have a  light switch at the top of the stairs and the bottom of the stairs. If you do not have them, ask someone to add them for you. What else can I do to help prevent falls? Wear shoes that: Do not have high heels. Have rubber bottoms. Are comfortable and fit you well. Are closed at the toe. Do not wear sandals. If you use a stepladder: Make sure that it is fully opened. Do not climb a closed stepladder. Make sure that both sides of the stepladder are locked into place. Ask someone to hold it for you, if possible. Clearly mark and make sure that you can see: Any grab bars or handrails. First and last steps. Where the edge of each step is. Use tools that help you move around (mobility aids) if they are needed. These include: Canes. Walkers. Scooters. Crutches. Turn on the lights when you  go into a dark area. Replace any light bulbs as soon as they burn out. Set up your furniture so you have a clear path. Avoid moving your furniture around. If any of your floors are uneven, fix them. If there are any pets around you, be aware of where they are. Review your medicines with your doctor. Some medicines can make you feel dizzy. This can increase your chance of falling. Ask your doctor what other things that you can do to help prevent falls. This information is not intended to replace advice given to you by your health care provider. Make sure you discuss any questions you have with your health care provider. Document Released: 03/28/2009 Document Revised: 11/07/2015 Document Reviewed: 07/06/2014 Elsevier Interactive Patient Education  2017 [employer].

## 2022-10-01 NOTE — Progress Notes (Signed)
Subjective:   Nicole Benson is a 72 y.o. female who presents for Medicare Annual (Subsequent) preventive examination.  I connected with  Gar Gibbon on 10/01/22 by a telephone enabled telemedicine application and verified that I am speaking with the correct person using two identifiers.   I discussed the limitations of evaluation and management by telemedicine. The patient expressed understanding and agreed to proceed.  Patient location: home  Provider location: telephone/home   Review of Systems     Cardiac Risk Factors include: advanced age (>41men, >76 women)     Objective:    Today's Vitals   There is no height or weight on file to calculate BMI.     10/01/2022    8:34 AM 10/16/2021    8:51 AM 03/04/2020    8:20 AM 09/27/2019   10:15 AM 05/27/2018    9:22 AM 02/04/2017    8:41 AM 09/16/2015   10:02 AM  Advanced Directives  Does Patient Have a Medical Advance Directive? Yes Yes Yes Yes No Yes Yes  Type of Estate agent of [employer] Power of [place];Living will Healthcare Power of [place];Living will Healthcare Power of [place];Living will  Healthcare Power of [place];Living will Healthcare Power of [place];Living will  Copy of Healthcare Power of Attorney in Chart? No - copy requested No - copy requested No - copy requested   No - copy requested   Would patient like information on creating a medical advance directive?     No - Patient declined      Current Medications (verified) Outpatient Encounter Medications as of 10/01/2022  Medication Sig   Calcium Carb-Cholecalciferol (OYSTER SHELL CALCIUM/VITAMIN D) 500-5 MG-MCG PACK Take by mouth.   Calcium Carbonate-Vitamin D (CALCIUM 500 + D PO) Take by mouth daily.   meloxicam (MOBIC) 15 MG tablet meloxicam 15 mg tablet  TAKE 1 TABLET BY MOUTH EVERY DAY   XIIDRA 5 % SOLN  (Patient not taking: Reported on 07/28/2022)   No facility-administered encounter medications on file as of 10/01/2022.     Allergies (verified) Sulfonamide derivatives   History: Past Medical History:  Diagnosis Date   Chronic lower back pain    Heart murmur    Past Surgical History:  Procedure Laterality Date   ABLATION     Radio frequency lower back   COLONOSCOPY     COLONOSCOPY W/ BIOPSIES     hyperplastic polyp   KNEE ARTHROSCOPY Left 12/06/2019   KNEE ARTHROSCOPY Right 2016   POLYPECTOMY     Family History  Problem Relation Age of Onset   Hypertension Mother    Throat cancer Father    Bone cancer Maternal Grandmother    Colon cancer Maternal Grandfather        ? early 36's   Esophageal cancer Neg Hx    Pancreatic cancer Neg Hx    Rectal cancer Neg Hx    Stomach cancer Neg Hx    Social History   Socioeconomic History   Marital status: Married    Spouse name: Not on file   Number of children: 1   Years of education: Not on file   Highest education level: Not on file  Occupational History   Occupation: botanist  Tobacco Use   Smoking status: Never   Smokeless tobacco: Never  Vaping Use   Vaping Use: Never used  Substance and Sexual Activity   Alcohol use: Yes    Alcohol/week: 7.0 standard drinks of alcohol    Types: 7  Glasses of wine per week    Comment: 4 drinks a week   Drug use: No   Sexual activity: Yes    Partners: Male  Other Topics Concern   Not on file  Social History Narrative   Married. Education: [employer]. Exercise: Yes.   Social Determinants of Health   Financial Resource Strain: Low Risk  (10/01/2022)   Overall Financial Resource Strain (CARDIA)    Difficulty of Paying Living Expenses: Not hard at all  Food Insecurity: No Food Insecurity (10/01/2022)   Hunger Vital Sign    Worried About Running Out of Food in the Last Year: Never true    Ran Out of Food in the Last Year: Never true  Transportation Needs: No Transportation Needs (10/01/2022)   PRAPARE - Administrator, Civil Service (Medical): No    Lack of Transportation (Non-Medical): No   Physical Activity: Sufficiently Active (10/01/2022)   Exercise Vital Sign    Days of Exercise per Week: 5 days    Minutes of Exercise per Session: 40 min  Stress: No Stress Concern Present (10/01/2022)   [employer] of Occupational Health - Occupational Stress Questionnaire    Feeling of Stress : Not at all  Social Connections: Moderately Integrated (10/01/2022)   Social Connection and Isolation Panel [NHANES]    Frequency of Communication with Friends and Family: More than three times a week    Frequency of Social Gatherings with Friends and Family: More than three times a week    Attends Religious Services: Never    Database administrator or Organizations: Yes    Attends Engineer, structural: More than 4 times per year    Marital Status: Married    Tobacco Counseling Counseling given: Not Answered   Clinical Intake:  Pre-visit preparation completed: Yes  Pain : No/denies pain     Diabetes: No  How often do you need to have someone help you when you read instructions, pamphlets, or other written materials from your doctor or pharmacy?: 1 - Never  Diabetic?  no  Interpreter Needed?: No  Information entered by :: Remi Haggard LPN   Activities of Daily Living    10/01/2022    8:38 AM 10/16/2021    8:54 AM  In your present state of health, do you have any difficulty performing the following activities:  Hearing? 1 0  Vision? 0 0  Difficulty concentrating or making decisions? 0 0  Walking or climbing stairs? 0 0  Dressing or bathing? 0 0  Doing errands, shopping? 0 0  Preparing Food and eating ? N N  Using the Toilet? N N  In the past six months, have you accidently leaked urine? N N  Do you have problems with loss of bowel control? N N  Managing your Medications? N N  Managing your Finances? N N  Housekeeping or managing your Housekeeping? N N    Patient Care Team: Sheliah Hatch, MD as PCP - [facility] ([facility] as Audiologist (Audiology)  Indicate any recent Medical Services you may have received from other than [facility] providers in the past year (date may be approximate).     Assessment:   This is a routine wellness examination for Nicole Benson.  Hearing/Vision screen Hearing Screening - Comments:: Hearing aids bilateral Vision Screening - Comments:: Up to date [facility]  Dietary issues and exercise activities discussed: Current Exercise Habits: Home exercise routine, Time (Minutes): 45, Frequency (Times/Week): 4, Weekly  Exercise (Minutes/Week): 180   Goals Addressed             This Visit's Progress    Patient Stated   On track    Maintain current health     Patient Stated       Continue current lifestyle       Depression Screen    10/01/2022    8:41 AM 05/14/2022    9:52 AM 04/28/2022    9:52 AM 10/16/2021    8:54 AM 10/16/2021    8:49 AM 06/25/2021    1:35 PM 05/13/2021    9:03 AM  PHQ 2/9 Scores  PHQ - 2 Score 0 0 0 0 0 0 0  PHQ- 9 Score 2 0 0   0 1    Fall Risk    10/01/2022    8:34 AM 05/14/2022    9:52 AM 04/28/2022    9:52 AM 10/16/2021    8:51 AM 06/25/2021    1:35 PM  Fall Risk   Falls in the past year? 0 0 0 0 0  Number falls in past yr: 0 0 0 0 0  Injury with Fall? 0 0 0 0 0  Risk for fall due to :  No Fall Risks No Fall Risks  No Fall Risks  Follow up Falls evaluation completed;Education provided;Falls prevention discussed Falls evaluation completed Falls evaluation completed Falls evaluation completed Falls evaluation completed    FALL RISK PREVENTION PERTAINING TO THE HOME:  Any stairs in or around the home? No  If so, are there any without handrails? No  Home free of loose throw rugs in walkways, pet beds, electrical cords, etc? Yes  Adequate lighting in your home to reduce risk of falls? Yes   ASSISTIVE DEVICES UTILIZED TO PREVENT FALLS:  Life alert? No  Use of a cane, walker or w/c? No  Grab bars in the bathroom? No  Shower chair or  bench in shower? Yes  Elevated toilet seat or a handicapped toilet? No   TIMED UP AND GO:    Cognitive Function:        10/01/2022    8:37 AM  6CIT Screen  What Year? 0 points  What month? 0 points  What time? 0 points  Count back from 20 0 points  Months in reverse 0 points  Repeat phrase 0 points  Total Score 0 points    Immunizations Immunization History  Administered Date(s) Administered   Fluad Quad(high Dose 65+) 02/14/2019, 03/18/2022   Influenza, High Dose Seasonal PF 04/05/2018   Influenza,inj,Quad PF,6+ Mos 04/29/2017   Influenza,inj,quad, With Preservative 03/15/2020   Influenza-Unspecified 03/05/2015, 04/23/2021   Moderna SARS-COV2 Booster Vaccination 04/29/2020, 12/17/2020   Moderna Sars-Covid-2 Vaccination 07/14/2019, 08/14/2019   Pfizer Covid-19 Vaccine Bivalent Booster 32yrs & up 04/23/2021   Pneumococcal Conjugate-13 12/10/2016   Pneumococcal Polysaccharide-23 02/14/2019   Td 09/27/2019   Tdap 02/05/2012   Zoster Recombinat (Shingrix) 03/05/2020, 05/20/2020   Zoster, Live 07/18/2012    TDAP status: Up to date  Flu Vaccine status: Up to date  Pneumococcal vaccine status: Up to date  Covid-19 vaccine status: Information provided on how to obtain vaccines.   Qualifies for Shingles Vaccine? No   Zostavax completed Yes   Shingrix Completed?: Yes  Screening Tests Health Maintenance  Topic Date Due   MAMMOGRAM  07/02/2022   COVID-19 Vaccine (6 - 2023-24 season) 10/17/2022 (Originally 02/13/2022)   INFLUENZA VACCINE  01/14/2023   Medicare Annual Wellness (AWV)  10/01/2023  COLONOSCOPY (Pts 45-22yrs Insurance coverage will need to be confirmed)  02/19/2027   DTaP/Tdap/Td (3 - Td or Tdap) 09/26/2029   Pneumonia Vaccine 81+ Years old  Completed   DEXA SCAN  Completed   Hepatitis C Screening  Completed   Zoster Vaccines- Shingrix  Completed   HPV VACCINES  Aged Out    Health Maintenance  Health Maintenance Due  Topic Date Due   MAMMOGRAM   07/02/2022    Colorectal cancer screening: Type of screening: Colonoscopy. Completed 2018. Repeat every 10 years  Mammogram status: Ordered  . Pt provided with contact info and advised to call to schedule appt.   Bone Density status: Completed 2024. Results reflect: Bone density results: OSTEOPENIA. Repeat every 2 years.  Lung Cancer Screening: (Low Dose CT Chest recommended if Age 68-80 years, 30 pack-year currently smoking OR have quit w/in 15years.) does not qualify.   Lung Cancer Screening Referral:   Additional Screening:  Hepatitis C Screening: does not qualify; Completed 2016  Vision Screening: Recommended annual ophthalmology exams for early detection of glaucoma and other disorders of the eye. Is the patient up to date with their annual eye exam?  Yes  Who is the provider or what is the name of the office in which the patient attends annual eye exams? [facility] If pt is not established with a provider, would they like to be referred to a provider to establish care? No .   Dental Screening: Recommended annual dental exams for proper oral hygiene  Community Resource Referral / Chronic Care Management: CRR required this visit?  No   CCM required this visit?  No      Plan:     I have personally reviewed and noted the following in the patient's chart:   Medical and social history Use of alcohol, tobacco or illicit drugs  Current medications and supplements including opioid prescriptions. Patient is not currently taking opioid prescriptions. Functional ability and status Nutritional status Physical activity Advanced directives List of other physicians Hospitalizations, surgeries, and ER visits in previous 12 months Vitals Screenings to include cognitive, depression, and falls Referrals and appointments  In addition, I have reviewed and discussed with patient certain preventive protocols, quality metrics, and best practice recommendations. A written personalized care  plan for preventive services as well as general preventive health recommendations were provided to patient.     Remi Haggard, LPN   1/61/0960   Nurse Notes:

## 2022-12-01 ENCOUNTER — Ambulatory Visit (INDEPENDENT_AMBULATORY_CARE_PROVIDER_SITE_OTHER): Payer: PPO | Admitting: Family Medicine

## 2022-12-01 VITALS — BP 138/80 | HR 51 | Temp 98.7°F | Ht 63.0 in | Wt 127.2 lb

## 2022-12-01 DIAGNOSIS — J069 Acute upper respiratory infection, unspecified: Secondary | ICD-10-CM | POA: Diagnosis not present

## 2022-12-01 MED ORDER — BENZONATATE 200 MG PO CAPS: 200.0000 mg | ORAL_CAPSULE | Freq: Three times a day (TID) | ORAL | 0 refills | Status: DC | PRN

## 2022-12-01 NOTE — Patient Instructions (Addendum)
-  It was a pleasure to take care of you.  -START Benzonatate 200mg  every 8 hours as needed for cough.  -You may use over the counter Robitussin at night for cough since this helped last night. -Also, you can use over the counter Mucinex. -Rest, hydrate, -Alternate Tylenol 1000mg  and Ibuprofen 600-800mg  every 4 hours for pain, headache, and body aches. Recommend to eat something when taking Ibuprofen.   -Follow up if not improved in one week. Viral injections mostly last 7-10 days.

## 2022-12-01 NOTE — Progress Notes (Signed)
Acute Office Visit   Subjective:  Patient ID: Nicole Benson, female    DOB: March 25, 1951, 72 y.o.   MRN: [id number]  Chief Complaint  Patient presents with   Sore Throat    Pt reports she has sore throat. SX 11/30/2022 Pt reports she took COVID test negative this morning. OTC -Robitussin  Pt reports this happening after flying- She flew home Sunday Pt reports she has dry ears    HPI Patient is a 16 caucasian female that presents with a sore throat, intermittent productive cough, shortness of breath on exertion, sinus congestion, and nasal drainage.   Denies chest pain, ear pain or discharge.    Symptoms started yesterday, 11/30/2022. Reports she flew home on Sunday and was a shuttle bus that had a lot of people on it.   She reports she has took OTC Robitussin. It helped some last night and allowed her to sleep.   Negative covid home test this morning and yesterday morning.  ROS See HPI above      Objective:   BP 138/80   Pulse (!) 51   Temp 98.7 F (37.1 C)   Ht 5\' 3"  (1.6 m)   Wt 127 lb 4 oz (57.7 kg)   SpO2 99%   BMI 22.54 kg/m    Physical Exam Vitals reviewed.  Constitutional:      General: She is not in acute distress.    Appearance: Normal appearance. She is not ill-appearing, toxic-appearing or diaphoretic.  HENT:     Head: Normocephalic and atraumatic.     Right Ear: Tympanic membrane, ear canal and external ear normal. There is no impacted cerumen.     Left Ear: Tympanic membrane, ear canal and external ear normal. There is no impacted cerumen.     Mouth/Throat:     Mouth: Mucous membranes are moist.     Pharynx: Oropharynx is clear. Uvula midline. Posterior oropharyngeal erythema (Mild) present. No pharyngeal swelling or oropharyngeal exudate.  Eyes:     General:        Right eye: No discharge.        Left eye: No discharge.     Conjunctiva/sclera: Conjunctivae normal.     Pupils: Pupils are equal, round, and reactive to light.  Neck:     Thyroid:  No thyromegaly.  Cardiovascular:     Rate and Rhythm: Normal rate and regular rhythm.     Heart sounds: Normal heart sounds. No murmur heard.    No friction rub. No gallop.  Pulmonary:     Effort: Pulmonary effort is normal. No respiratory distress.     Breath sounds: Normal breath sounds.     Comments: Non productive cough during visit  Abdominal:     General: Abdomen is flat. Bowel sounds are normal.     Palpations: Abdomen is soft.  Musculoskeletal:        General: Normal range of motion.     Cervical back: Normal range of motion.     Right lower leg: No edema.     Left lower leg: No edema.  Lymphadenopathy:     Head:     Right side of head: No submental or submandibular adenopathy.     Left side of head: No submental or submandibular adenopathy.     Cervical: No cervical adenopathy.  Skin:    General: Skin is warm and dry.  Neurological:     General: No focal deficit present.     Mental Status: She is alert  and oriented to person, place, and time. Mental status is at baseline.  Psychiatric:        Mood and Affect: Mood normal.        Behavior: Behavior normal.        Thought Content: Thought content normal.        Judgment: Judgment normal.       Assessment & Plan:  Viral upper respiratory tract infection -     Benzonatate; Take 1 capsule (200 mg total) by mouth 3 (three) times daily as needed for cough.  Dispense: 20 capsule; Refill: 0  -Prescribed Benzonatate 200mg  every 8 hours as needed for cough.  -Advised she may use OTC Robitussin at night for cough since this helped last night. -Also, advised she can use over the counter Mucinex. -Rest, hydrate, -Alternate Tylenol 1000mg  and Ibuprofen 600-800mg  every 4 hours for pain, headache, and body aches. Recommend to eat something when taking Ibuprofen.   -Follow up if not improved in one week. Viral injections mostly last 7-10 days.    Zandra Abts, NP

## 2023-01-04 ENCOUNTER — Telehealth: Payer: Self-pay

## 2023-01-04 NOTE — Telephone Encounter (Signed)
I spoke with patient to see about scheduling a follow up with office to discuss chronic conditions. She declined at this time. Nicole Benson [facility] Health Specialist

## 2023-01-05 ENCOUNTER — Telehealth: Payer: PPO | Admitting: Family Medicine

## 2023-01-05 VITALS — Temp 98.0°F | Ht 63.0 in | Wt 128.0 lb

## 2023-01-05 DIAGNOSIS — R112 Nausea with vomiting, unspecified: Secondary | ICD-10-CM | POA: Diagnosis not present

## 2023-01-05 DIAGNOSIS — U071 COVID-19: Secondary | ICD-10-CM

## 2023-01-05 MED ORDER — NIRMATRELVIR/RITONAVIR (PAXLOVID)TABLET: 3.0000 | ORAL_TABLET | Freq: Two times a day (BID) | ORAL | 0 refills | Status: AC

## 2023-01-05 MED ORDER — ONDANSETRON HCL 4 MG PO TABS: 4.0000 mg | ORAL_TABLET | Freq: Three times a day (TID) | ORAL | 0 refills | Status: DC | PRN

## 2023-01-05 NOTE — Progress Notes (Signed)
Virtual Visit via Video Note  I connected with Nicole Benson on 01/05/23 at 8:32 am  by a video enabled telemedicine application and verified that I am speaking with the correct person using two identifiers.   Patient Location: Home Provider Location: office - [facility].    I discussed the limitations, risks, security and privacy concerns of performing an evaluation and management service by telephone and the availability of in person appointments. I also discussed with the patient that there may be a patient responsible charge related to this service. The patient expressed understanding and agreed to proceed, consent obtained  Chief Complaint  Patient presents with   Nasal Congestion    Pt has C/O of sinus congestion all night last night Pt reports sx started Sunday.C/O headache, fatigued,nasal congestion. MVH:QIONGEX   , tylenol, nasal spray. Pt reports she had a flight last week.   History of Present Illness: Nicole Benson is a 72 y.o. female +frontal headache +difficulty breathing from the nose +sinus congestion +fatigue  +post nasal drainage +nausea/vomiting  -fever -chest pain -shortness of breath -sore throat -ear pain or drainage  Symptoms started on Sunday, worse on Monday.   Patient reports she has took OTC Mucinex, Tylenol, and nasal spray-unsure if it has really helped.  Patient reports she traveled last week, got home last Tuesday night.   Patient Active Problem List   Diagnosis Date Noted   Irritable bowel syndrome with diarrhea 07/28/2022   Right shoulder pain 12/05/2019   Baker's cyst of knee, left 05/23/2019   Hyperlipidemia 02/14/2019   Physical exam 04/29/2017   Polyarthralgia 03/29/2017   SUBSCAPULARIS SPRAIN AND STRAIN 02/21/2009   Persistent disorder of initiating or maintaining sleep 12/23/2006   MENISCUS INJURY 12/23/2006   Past Medical History:  Diagnosis Date   Chronic lower back pain    Heart murmur    Past Surgical  History:  Procedure Laterality Date   ABLATION     Radio frequency lower back   COLONOSCOPY     COLONOSCOPY W/ BIOPSIES     hyperplastic polyp   KNEE ARTHROSCOPY Left 12/06/2019   KNEE ARTHROSCOPY Right 2016   POLYPECTOMY     Allergies  Allergen Reactions   Sulfonamide Derivatives Rash   Prior to Admission medications   Medication Sig Start Date End Date Taking? Authorizing Provider  benzonatate (TESSALON) 200 MG capsule Take 1 capsule (200 mg total) by mouth 3 (three) times daily as needed for cough. 12/01/22   Alveria Apley, NP  Calcium Carb-Cholecalciferol (OYSTER SHELL CALCIUM/VITAMIN D) 500-5 MG-MCG PACK Take by mouth.    [provider]  meloxicam (MOBIC) 15 MG tablet meloxicam 15 mg tablet  TAKE 1 TABLET BY MOUTH EVERY DAY 01/09/20   [provider]   Social History   Socioeconomic History   Marital status: Married    Spouse name: Not on file   Number of children: 1   Years of education: Not on file   Highest education level: Master's degree (e.g., MA, MS, MEng, MEd, MSW, MBA)  Occupational History   Occupation: botanist  Tobacco Use   Smoking status: Never   Smokeless tobacco: Never  Vaping Use   Vaping status: Never Used  Substance and Sexual Activity   Alcohol use: Yes    Alcohol/week: 7.0 standard drinks of alcohol    Types: 7 Glasses of wine per week    Comment: 4 drinks a week   Drug use: No   Sexual activity: Yes  Partners: Male  Other Topics Concern   Not on file  Social History Narrative   Married. Education: [employer]. Exercise: Yes.   Social Determinants of Health   Financial Resource Strain: Low Risk  (12/01/2022)   Overall Financial Resource Strain (CARDIA)    Difficulty of Paying Living Expenses: Not hard at all  Food Insecurity: No Food Insecurity (12/01/2022)   Hunger Vital Sign    Worried About Running Out of Food in the Last Year: Never true    Ran Out of Food in the Last Year: Never true  Transportation Needs:  No Transportation Needs (12/01/2022)   PRAPARE - Administrator, Civil Service (Medical): No    Lack of Transportation (Non-Medical): No  Physical Activity: Sufficiently Active (12/01/2022)   Exercise Vital Sign    Days of Exercise per Week: 5 days    Minutes of Exercise per Session: 60 min  Stress: No Stress Concern Present (12/01/2022)   [employer] of Occupational Health - Occupational Stress Questionnaire    Feeling of Stress : Not at all  Social Connections: Moderately Integrated (12/01/2022)   Social Connection and Isolation Panel [NHANES]    Frequency of Communication with Friends and Family: Three times a week    Frequency of Social Gatherings with Friends and Family: Three times a week    Attends Religious Services: Never    Active Member of Clubs or Organizations: Yes    Attends Banker Meetings: Never    Marital Status: Married  Catering manager Violence: Not At Risk (10/01/2022)   Humiliation, Afraid, Rape, and Kick questionnaire    Fear of Current or Ex-Partner: No    Emotionally Abused: No    Physically Abused: No    Sexually Abused: No    Observations/Objective: Today's Vitals   01/05/23 0815  Temp: 98 F (36.7 C)  Weight: 128 lb (58.1 kg)  Height: 5\' 3"  (1.6 m)   Physical Exam Constitutional:      General: She is not in acute distress.    Appearance: Normal appearance. She is ill-appearing. She is not toxic-appearing or diaphoretic.  HENT:     Head: Normocephalic and atraumatic.  Eyes:     General:        Right eye: No discharge.        Left eye: No discharge.     Conjunctiva/sclera: Conjunctivae normal.  Pulmonary:     Effort: No respiratory distress.  Skin:    General: Skin is dry.  Neurological:     Mental Status: She is alert and oriented to person, place, and time.     Assessment and Plan: COVID -     nirmatrelvir/ritonavir; Take 3 tablets by mouth 2 (two) times daily for 5 days. (Take nirmatrelvir 150 mg two  tablets twice daily for 5 days and ritonavir 100 mg one tablet twice daily for 5 days) Patient GFR is 75.74  Dispense: 30 tablet; Refill: 0  Other orders -     Ondansetron HCl; Take 1 tablet (4 mg total) by mouth every 8 (eight) hours as needed for nausea or vomiting.  Dispense: 20 tablet; Refill: 0  1.During visit patient tested positive for covid at home.  2.Prescribed Paxlovid for 5 days for covid. 3.Prescribed Zofran 4mg  for nausea and vomiting. Recommend to take medication before taking Paxlovid and other medication to prevent vomiting. 4.Rest, hydrate with fluids, and recommend to sit up as much as possible to allow nasal drainage and take deep breaths to prevent pneumonia.  5.Alternate Tylenol 1000mg  and Ibuprofen 600-800mg  every 4 hours for pain, headache, and body aches. Recommend to eat something when taking Ibuprofen. Recommend to not take Meloxicam while taking Ibuprofen. 6. Advised she can continue with Mucinex. 7. Recommend to go to the closes emergency department if she develops chest pain and shortness of breath.  8.Discussed about the quarantine guidelines for covid.  9.Recommend vitamin C 1000mg  and Zinc 50-100mg  daily for the next 30 days to help support immune system.   Follow Up Instructions: If not improved.    I discussed the assessment and treatment plan with the patient. The patient was provided an opportunity to ask questions and all were answered. The patient agreed with the plan and demonstrated an understanding of the instructions.   The patient was advised to call back or seek an in-person evaluation if the symptoms worsen or if the condition fails to improve as anticipated.  Zandra Abts, NP

## 2023-01-13 ENCOUNTER — Other Ambulatory Visit: Payer: Self-pay

## 2023-01-21 DIAGNOSIS — L720 Epidermal cyst: Secondary | ICD-10-CM | POA: Diagnosis not present

## 2023-01-26 ENCOUNTER — Encounter: Payer: Self-pay | Admitting: Family Medicine

## 2023-02-04 ENCOUNTER — Ambulatory Visit: Payer: PPO | Admitting: Family Medicine

## 2023-02-04 ENCOUNTER — Encounter: Payer: Self-pay | Admitting: Family Medicine

## 2023-02-04 VITALS — BP 112/68 | HR 54 | Temp 98.7°F | Resp 17 | Ht 63.0 in | Wt 130.1 lb

## 2023-02-04 DIAGNOSIS — M7989 Other specified soft tissue disorders: Secondary | ICD-10-CM

## 2023-02-04 NOTE — Progress Notes (Signed)
   Subjective:    Patient ID: Nicole Benson, female    DOB: 01-13-1951, 71 y.o.   MRN: [id number]  HPI Hand bump- swelling on dorsum of R hand.  Pt reports there has been a bump on the back of her hand for years- since birth.  Area is now swollen.  No pain.  Since swelling has not improved in the last few months, she wanted to have this assessed.   Review of Systems For ROS see HPI     Objective:   Physical Exam Vitals reviewed.  Constitutional:      General: She is not in acute distress.    Appearance: Normal appearance. She is not ill-appearing.  HENT:     Head: Normocephalic and atraumatic.  Cardiovascular:     Pulses: Normal pulses.  Musculoskeletal:        General: Deformity (2" x 1" soft tissue swelling on dorsum of R hand.  not firm, not well circumscribed, no TTP) present.  Skin:    General: Skin is warm and dry.     Findings: No bruising or erythema.  Neurological:     General: No focal deficit present.     Mental Status: She is alert and oriented to person, place, and time.           Assessment & Plan:  Soft tissue mass of R hand- deteriorated.  Pt reports there has been a soft tissue mass present 'since birth' but it is enlarging.  No TTP, no limitation in ROM.  Refer to Hand for complete evaluation and tx.  Pt expressed understanding and is in agreement w/ plan.

## 2023-02-04 NOTE — Patient Instructions (Signed)
Follow up as needed or as scheduled We'll call you to schedule your hand appt If you develop pain or swelling worsens- ice Call with any questions or concerns Stay Safe!  Stay Healthy! Happy Labor Day!!!

## 2023-02-24 ENCOUNTER — Ambulatory Visit (INDEPENDENT_AMBULATORY_CARE_PROVIDER_SITE_OTHER): Payer: PPO | Admitting: Family Medicine

## 2023-02-24 ENCOUNTER — Encounter: Payer: Self-pay | Admitting: Family Medicine

## 2023-02-24 VITALS — BP 104/72 | HR 54 | Temp 97.8°F | Resp 18 | Ht 63.0 in | Wt 130.1 lb

## 2023-02-24 DIAGNOSIS — H6991 Unspecified Eustachian tube disorder, right ear: Secondary | ICD-10-CM | POA: Diagnosis not present

## 2023-02-24 MED ORDER — AZELASTINE HCL 0.1 % NA SOLN: 1.0000 | Freq: Two times a day (BID) | NASAL | 5 refills | Status: AC

## 2023-02-24 NOTE — Patient Instructions (Addendum)
Follow up as needed or as scheduled START the Azelastine nasal spray as directed ADD the Zyrtec (Cetirizine) or Claritin (Loratadine) daily to decrease congestion Drink LOTS of fluids Call with any questions or concerns Hang in there!!!

## 2023-02-24 NOTE — Progress Notes (Signed)
   Subjective:    Patient ID: Nicole Benson, female    DOB: 07-Jan-1951, 72 y.o.   MRN: [id number]  HPI Ear fullness- sxs started 7-10 days ago w/ intense itching and muffled hearing.  L ear is asymptomatic.  Pt reports somewhat improved today.  Has upcoming appt w/ audiologist.  No drainage.  Denies pain.   Review of Systems For ROS see HPI     Objective:   Physical Exam Vitals reviewed.  Constitutional:      Appearance: Normal appearance. She is not ill-appearing.  HENT:     Head: Normocephalic and atraumatic.     Right Ear: Tympanic membrane is retracted.     Left Ear: Tympanic membrane normal.     Nose: Congestion present. No rhinorrhea.     Mouth/Throat:     Mouth: Mucous membranes are moist.     Comments: + PND Skin:    General: Skin is warm and dry.  Neurological:     General: No focal deficit present.     Mental Status: She is alert and oriented to person, place, and time.  Psychiatric:        Mood and Affect: Mood normal.        Behavior: Behavior normal.        Thought Content: Thought content normal.           Assessment & Plan:  R sided Eustachian Tube Dysfxn- new.  Reviewed dx w/ pt and discussed tx plan.  Due to her borderline glaucoma will avoid nasal steroid and start nasal antihistamine instead.  Start daily Claritin or Zyrtec.  Reviewed supportive care and red flags that should prompt return.  Pt expressed understanding and is in agreement w/ plan.

## 2023-03-03 ENCOUNTER — Other Ambulatory Visit (INDEPENDENT_AMBULATORY_CARE_PROVIDER_SITE_OTHER): Payer: PPO

## 2023-03-03 ENCOUNTER — Ambulatory Visit: Payer: PPO | Admitting: Orthopedic Surgery

## 2023-03-03 DIAGNOSIS — R2231 Localized swelling, mass and lump, right upper limb: Secondary | ICD-10-CM | POA: Diagnosis not present

## 2023-03-03 NOTE — Progress Notes (Signed)
Nicole Benson - 72 y.o. female MRN [id number]  Date of birth: August 11, 1950  Office Visit Note: Visit Date: 03/03/2023 PCP: Sheliah Hatch, MD Referred by: Sheliah Hatch, MD  Subjective: No chief complaint on file.  HPI: Nicole Benson is a pleasant 72 y.o. female who presents today for evaluation of a right hand mass that has been present for multiple months.  States that the mass is relatively unchanged in nature, nonpainful, has occasional swelling with overuse.  She states that she normally has a lump over the dorsal aspect of the hand however this area has grown larger more recently.  No prior trauma.  Has not undergone prior workup or treatment.  No prior history of masses, tumors or cancerous lesions.  Pertinent ROS were reviewed with the patient and found to be negative unless otherwise specified above in HPI.   Visit Reason: right hand Hand dominance: right Occupation: retired Diabetic: No Heart/Lung History: none Blood Thinners:  none  Prior Testing/EMG:none Injections (Date): none Treatments: none Prior Surgery: none  Not painful Just swell with overuse  Assessment & Plan: Visit Diagnoses:  1. Mass of hand, right   2. Localized swelling on right hand     Plan: Extensive discussion was had with the patient today regarding her right hand mass.  I reviewed the results of her x-rays taken today which do show evidence of metacarpal boss with bony overgrowth on the dorsal aspect of the hand.  However, she states that the lump would normally exist in that area is larger more recently with expansile growth in this area.  For this reason, would like to investigate this with an MRI study of the right hand in order to better diagnose the mass in question and help guide potential surgical planning.  Patient expressed understanding, we will obtain the MRI study and follow-up with her based on the results.  Follow-up: No follow-ups on file.   Meds & Orders: No orders  of the defined types were placed in this encounter.   Orders Placed This Encounter  Procedures   XR Hand Complete Right   MR HAND RIGHT WO CONTRAST     Procedures: No procedures performed      Clinical History: No specialty comments available.  She reports that she has never smoked. She has never used smokeless tobacco. No results for input(s): "HGBA1C", "LABURIC" in the last 8760 hours.  Objective:   Vital Signs: There were no vitals taken for this visit.  Physical Exam  Gen: Well-appearing, in no acute distress; non-toxic CV: Regular Rate. Well-perfused. Warm.  Resp: Breathing unlabored on room air; no wheezing. Psych: Fluid speech in conversation; appropriate affect; normal thought process  Ortho Exam Right hand: - Palpable mobile mass over the dorsal aspect of the hand, in line with the long finger, soft, compressible, mobile, minimally painful, measures approximately 1.5 cm x 1.5 cm - Small palpable mass over the dorsal aspect of the wrist, more noticeable with wrist flexion, 0.5 cm x 0.5 cm, nontender, mobile - Wrist range of motion flexion/extension 65/55, full pronation/supination, composite fist without restriction - Sensation is intact in all distributions, hand is warm well-perfused  Imaging: XR Hand Complete Right  Result Date: 03/03/2023 X-rays of the right hand, multiple views were obtained today X-rays demonstrate bony overgrowth at the carpometacarpal joint of the long finger consistent with carpal metacarpal boss.  No other significant bony abnormalities appreciated.   Past Medical/Family/Surgical/Social History: Medications & Allergies reviewed per EMR, new  medications updated. Patient Active Problem List   Diagnosis Date Noted   Irritable bowel syndrome with diarrhea 07/28/2022   Right shoulder pain 12/05/2019   Baker's cyst of knee, left 05/23/2019   Hyperlipidemia 02/14/2019   Physical exam 04/29/2017   Polyarthralgia 03/29/2017   SUBSCAPULARIS  SPRAIN AND STRAIN 02/21/2009   Persistent disorder of initiating or maintaining sleep 12/23/2006   MENISCUS INJURY 12/23/2006   Past Medical History:  Diagnosis Date   Chronic lower back pain    Heart murmur    Family History  Problem Relation Age of Onset   Hypertension Mother    Throat cancer Father    Bone cancer Maternal Grandmother    Colon cancer Maternal Grandfather        ? early 17's   Esophageal cancer Neg Hx    Pancreatic cancer Neg Hx    Rectal cancer Neg Hx    Stomach cancer Neg Hx    Past Surgical History:  Procedure Laterality Date   ABLATION     Radio frequency lower back   COLONOSCOPY     COLONOSCOPY W/ BIOPSIES     hyperplastic polyp   KNEE ARTHROSCOPY Left 12/06/2019   KNEE ARTHROSCOPY Right 2016   POLYPECTOMY     Social History   Occupational History   Occupation: Chief Strategy Officer  Tobacco Use   Smoking status: Never   Smokeless tobacco: Never  Vaping Use   Vaping status: Never Used  Substance and Sexual Activity   Alcohol use: Yes    Alcohol/week: 7.0 standard drinks of alcohol    Types: 7 Glasses of wine per week    Comment: 4 drinks a week   Drug use: No   Sexual activity: Yes    Partners: Male    Aidenn Skellenger Fara Boros) Denese Killings, M.D. [facility] 8:51 AM

## 2023-03-16 DIAGNOSIS — H6121 Impacted cerumen, right ear: Secondary | ICD-10-CM | POA: Diagnosis not present

## 2023-03-16 DIAGNOSIS — L299 Pruritus, unspecified: Secondary | ICD-10-CM | POA: Diagnosis not present

## 2023-05-28 DIAGNOSIS — M9901 Segmental and somatic dysfunction of cervical region: Secondary | ICD-10-CM | POA: Diagnosis not present

## 2023-05-28 DIAGNOSIS — M79622 Pain in left upper arm: Secondary | ICD-10-CM | POA: Diagnosis not present

## 2023-05-28 DIAGNOSIS — M9908 Segmental and somatic dysfunction of rib cage: Secondary | ICD-10-CM | POA: Diagnosis not present

## 2023-05-28 DIAGNOSIS — M9902 Segmental and somatic dysfunction of thoracic region: Secondary | ICD-10-CM | POA: Diagnosis not present

## 2023-05-28 DIAGNOSIS — R531 Weakness: Secondary | ICD-10-CM | POA: Diagnosis not present

## 2023-05-28 DIAGNOSIS — M9907 Segmental and somatic dysfunction of upper extremity: Secondary | ICD-10-CM | POA: Diagnosis not present

## 2023-05-28 DIAGNOSIS — M25512 Pain in left shoulder: Secondary | ICD-10-CM | POA: Diagnosis not present

## 2023-07-06 DIAGNOSIS — Z1231 Encounter for screening mammogram for malignant neoplasm of breast: Secondary | ICD-10-CM | POA: Diagnosis not present

## 2023-07-06 LAB — HM MAMMOGRAPHY

## 2023-08-03 DIAGNOSIS — M25551 Pain in right hip: Secondary | ICD-10-CM | POA: Diagnosis not present

## 2023-08-03 DIAGNOSIS — M9906 Segmental and somatic dysfunction of lower extremity: Secondary | ICD-10-CM | POA: Diagnosis not present

## 2023-08-03 DIAGNOSIS — M9904 Segmental and somatic dysfunction of sacral region: Secondary | ICD-10-CM | POA: Diagnosis not present

## 2023-08-03 DIAGNOSIS — M9905 Segmental and somatic dysfunction of pelvic region: Secondary | ICD-10-CM | POA: Diagnosis not present

## 2023-08-03 DIAGNOSIS — M25051 Hemarthrosis, right hip: Secondary | ICD-10-CM | POA: Diagnosis not present

## 2023-08-03 DIAGNOSIS — M25351 Other instability, right hip: Secondary | ICD-10-CM | POA: Diagnosis not present

## 2023-08-03 DIAGNOSIS — M25651 Stiffness of right hip, not elsewhere classified: Secondary | ICD-10-CM | POA: Diagnosis not present

## 2023-08-05 ENCOUNTER — Encounter: Payer: Self-pay | Admitting: Family Medicine

## 2023-08-12 DIAGNOSIS — H40013 Open angle with borderline findings, low risk, bilateral: Secondary | ICD-10-CM | POA: Diagnosis not present

## 2023-08-12 DIAGNOSIS — H40053 Ocular hypertension, bilateral: Secondary | ICD-10-CM | POA: Diagnosis not present

## 2023-08-17 DIAGNOSIS — L814 Other melanin hyperpigmentation: Secondary | ICD-10-CM | POA: Diagnosis not present

## 2023-08-17 DIAGNOSIS — D225 Melanocytic nevi of trunk: Secondary | ICD-10-CM | POA: Diagnosis not present

## 2023-08-17 DIAGNOSIS — L821 Other seborrheic keratosis: Secondary | ICD-10-CM | POA: Diagnosis not present

## 2023-08-17 DIAGNOSIS — Z7189 Other specified counseling: Secondary | ICD-10-CM | POA: Diagnosis not present

## 2023-08-17 DIAGNOSIS — L2989 Other pruritus: Secondary | ICD-10-CM | POA: Diagnosis not present

## 2023-08-17 DIAGNOSIS — L820 Inflamed seborrheic keratosis: Secondary | ICD-10-CM | POA: Diagnosis not present

## 2023-08-17 DIAGNOSIS — L538 Other specified erythematous conditions: Secondary | ICD-10-CM | POA: Diagnosis not present

## 2023-10-05 ENCOUNTER — Ambulatory Visit (INDEPENDENT_AMBULATORY_CARE_PROVIDER_SITE_OTHER): Admitting: *Deleted

## 2023-10-05 DIAGNOSIS — Z0000 Encounter for general adult medical examination without abnormal findings: Secondary | ICD-10-CM

## 2023-10-05 NOTE — Patient Instructions (Signed)
 Nicole Benson , Thank you for taking time to come for your Medicare Wellness Visit. I appreciate your ongoing commitment to your health goals. Please review the following plan we discussed and let me know if I can assist you in the future.   Screening recommendations/referrals: Colonoscopy: up to date Mammogram: up to date Bone Density: up to date Recommended yearly ophthalmology/optometry visit for glaucoma screening and checkup Recommended yearly dental visit for hygiene and checkup  Vaccinations: Influenza vaccine: up to date Pneumococcal vaccine: up to date Tdap vaccine: up to date Shingles vaccine: up to date       Preventive Care 65 Years and Older, Female Preventive care refers to lifestyle choices and visits with your health care provider that can promote health and wellness. What does preventive care include? A yearly physical exam. This is also called an annual well check. Dental exams once or twice a year. Routine eye exams. Ask your health care provider how often you should have your eyes checked. Personal lifestyle choices, including: Daily care of your teeth and gums. Regular physical activity. Eating a healthy diet. Avoiding tobacco and drug use. Limiting alcohol use. Practicing safe sex. Taking low-dose aspirin every day. Taking vitamin and mineral supplements as recommended by your health care provider. What happens during an annual well check? The services and screenings done by your health care provider during your annual well check will depend on your age, overall health, lifestyle risk factors, and family history of disease. Counseling  Your health care provider may ask you questions about your: Alcohol use. Tobacco use. Drug use. Emotional well-being. Home and relationship well-being. Sexual activity. Eating habits. History of falls. Memory and ability to understand (cognition). Work and work Astronomer. Reproductive health. Screening  You may have  the following tests or measurements: Height, weight, and BMI. Blood pressure. Lipid and cholesterol levels. These may be checked every 5 years, or more frequently if you are over 52 years old. Skin check. Lung cancer screening. You may have this screening every year starting at age 68 if you have a 30-pack-year history of smoking and currently smoke or have quit within the past 15 years. Fecal occult blood test (FOBT) of the stool. You may have this test every year starting at age 85. Flexible sigmoidoscopy or colonoscopy. You may have a sigmoidoscopy every 5 years or a colonoscopy every 10 years starting at age 92. Hepatitis C blood test. Hepatitis B blood test. Sexually transmitted disease (STD) testing. Diabetes screening. This is done by checking your blood sugar (glucose) after you have not eaten for a while (fasting). You may have this done every 1-3 years. Bone density scan. This is done to screen for osteoporosis. You may have this done starting at age 63. Mammogram. This may be done every 1-2 years. Talk to your health care provider about how often you should have regular mammograms. Talk with your health care provider about your test results, treatment options, and if necessary, the need for more tests. Vaccines  Your health care provider may recommend certain vaccines, such as: Influenza vaccine. This is recommended every year. Tetanus, diphtheria, and acellular pertussis (Tdap, Td) vaccine. You may need a Td booster every 10 years. Zoster vaccine. You may need this after age 60. Pneumococcal 13-valent conjugate (PCV13) vaccine. One dose is recommended after age 73. Pneumococcal polysaccharide (PPSV23) vaccine. One dose is recommended after age 19. Talk to your health care provider about which screenings and vaccines you need and how often you need them.  This information is not intended to replace advice given to you by your health care provider. Make sure you discuss any questions  you have with your health care provider. Document Released: 06/28/2015 Document Revised: 02/19/2016 Document Reviewed: 04/02/2015 Elsevier Interactive Patient Education  2017 [employer].  Fall Prevention in the Home Falls can cause injuries. They can happen to people of all ages. There are many things you can do to make your home safe and to help prevent falls. What can I do on the outside of my home? Regularly fix the edges of walkways and driveways and fix any cracks. Remove anything that might make you trip as you walk through a door, such as a raised step or threshold. Trim any bushes or trees on the path to your home. Use bright outdoor lighting. Clear any walking paths of anything that might make someone trip, such as rocks or tools. Regularly check to see if handrails are loose or broken. Make sure that both sides of any steps have handrails. Any raised decks and porches should have guardrails on the edges. Have any leaves, snow, or ice cleared regularly. Use sand or salt on walking paths during winter. Clean up any spills in your garage right away. This includes oil or grease spills. What can I do in the bathroom? Use night lights. Install grab bars by the toilet and in the tub and shower. Do not use towel bars as grab bars. Use non-skid mats or decals in the tub or shower. If you need to sit down in the shower, use a plastic, non-slip stool. Keep the floor dry. Clean up any water  that spills on the floor as soon as it happens. Remove soap buildup in the tub or shower regularly. Attach bath mats securely with double-sided non-slip rug tape. Do not have throw rugs and other things on the floor that can make you trip. What can I do in the bedroom? Use night lights. Make sure that you have a light by your bed that is easy to reach. Do not use any sheets or blankets that are too big for your bed. They should not hang down onto the floor. Have a firm chair that has side arms. You  can use this for support while you get dressed. Do not have throw rugs and other things on the floor that can make you trip. What can I do in the kitchen? Clean up any spills right away. Avoid walking on wet floors. Keep items that you use a lot in easy-to-reach places. If you need to reach something above you, use a strong step stool that has a grab bar. Keep electrical cords out of the way. Do not use floor polish or wax that makes floors slippery. If you must use wax, use non-skid floor wax. Do not have throw rugs and other things on the floor that can make you trip. What can I do with my stairs? Do not leave any items on the stairs. Make sure that there are handrails on both sides of the stairs and use them. Fix handrails that are broken or loose. Make sure that handrails are as long as the stairways. Check any carpeting to make sure that it is firmly attached to the stairs. Fix any carpet that is loose or worn. Avoid having throw rugs at the top or bottom of the stairs. If you do have throw rugs, attach them to the floor with carpet tape. Make sure that you have a light switch at the  top of the stairs and the bottom of the stairs. If you do not have them, ask someone to add them for you. What else can I do to help prevent falls? Wear shoes that: Do not have high heels. Have rubber bottoms. Are comfortable and fit you well. Are closed at the toe. Do not wear sandals. If you use a stepladder: Make sure that it is fully opened. Do not climb a closed stepladder. Make sure that both sides of the stepladder are locked into place. Ask someone to hold it for you, if possible. Clearly mark and make sure that you can see: Any grab bars or handrails. First and last steps. Where the edge of each step is. Use tools that help you move around (mobility aids) if they are needed. These include: Canes. Walkers. Scooters. Crutches. Turn on the lights when you go into a dark area. Replace any  light bulbs as soon as they burn out. Set up your furniture so you have a clear path. Avoid moving your furniture around. If any of your floors are uneven, fix them. If there are any pets around you, be aware of where they are. Review your medicines with your doctor. Some medicines can make you feel dizzy. This can increase your chance of falling. Ask your doctor what other things that you can do to help prevent falls. This information is not intended to replace advice given to you by your health care provider. Make sure you discuss any questions you have with your health care provider. Document Released: 03/28/2009 Document Revised: 11/07/2015 Document Reviewed: 07/06/2014 Elsevier Interactive Patient Education  2017 [employer].

## 2023-10-05 NOTE — Progress Notes (Signed)
 Subjective:   [name] is a 73 y.o. female who presents for Medicare Annual (Subsequent) preventive examination.  Visit Complete: Virtual I connected with  Nicole Benson on 10/05/23 by a audio enabled telemedicine application and verified that I am speaking with the correct person using two identifiers.  Patient Location: Home  Provider Location: Home Office  I discussed the limitations of evaluation and management by telemedicine. The patient expressed understanding and agreed to proceed.  Vital Signs: Because this visit was a virtual/telehealth visit, some criteria may be missing or patient reported. Any vitals not documented were not able to be obtained and vitals that have been documented are patient reported.  Patient Medicare AWV questionnaire was completed by the patient on 10-04-2023; I have confirmed that all information answered by patient is correct and no changes since this date.  Cardiac Risk Factors include: advanced age (>64men, >30 women)     Objective:    There were no vitals filed for this visit. There is no height or weight on file to calculate BMI.     10/05/2023    8:13 AM 10/01/2022    8:34 AM 10/16/2021    8:51 AM 03/04/2020    8:20 AM 09/27/2019   10:15 AM 05/27/2018    9:22 AM 02/04/2017    8:41 AM  Advanced Directives  Does Patient Have a Medical Advance Directive? Yes Yes Yes Yes Yes No Yes  Type of Sales promotion account executive of [employer] Power of [place];Living will Healthcare Power of [place];Living will Healthcare Power of [place];Living will  Healthcare Power of [place];Living will  Copy of Healthcare Power of Attorney in Chart? Yes - validated most recent copy scanned in chart (See row information) No - copy requested No - copy requested No - copy requested   No - copy requested  Would patient like information on creating a medical advance directive?      No - Patient declined     Current  Medications (verified) Outpatient Encounter Medications as of 10/05/2023  Medication Sig   Calcium Carb-Cholecalciferol (OYSTER SHELL CALCIUM/VITAMIN D ) 500-5 MG-MCG PACK Take by mouth.   azelastine  (ASTELIN ) 0.1 % nasal spray Place 1 spray into both nostrils 2 (two) times daily. Use in each nostril as directed   meloxicam  (MOBIC ) 15 MG tablet meloxicam  15 mg tablet  TAKE 1 TABLET BY MOUTH EVERY DAY   No facility-administered encounter medications on file as of 10/05/2023.    Allergies (verified) Sulfonamide derivatives   History: Past Medical History:  Diagnosis Date   Chronic lower back pain    Heart murmur    Past Surgical History:  Procedure Laterality Date   ABLATION     Radio frequency lower back   COLONOSCOPY     COLONOSCOPY W/ BIOPSIES     hyperplastic polyp   KNEE ARTHROSCOPY Left 12/06/2019   KNEE ARTHROSCOPY Right 2016   POLYPECTOMY     Family History  Problem Relation Age of Onset   Hypertension Mother    Throat cancer Father    Bone cancer Maternal Grandmother    Colon cancer Maternal Grandfather        ? early 36's   Esophageal cancer Neg Hx    Pancreatic cancer Neg Hx    Rectal cancer Neg Hx    Stomach cancer Neg Hx    Social History   Socioeconomic History   Marital status: Married    Spouse name: Not on file  Number of children: 1   Years of education: Not on file   Highest education level: Master's degree (e.g., MA, MS, MEng, MEd, MSW, MBA)  Occupational History   Occupation: botanist  Tobacco Use   Smoking status: Never   Smokeless tobacco: Never  Vaping Use   Vaping status: Never Used  Substance and Sexual Activity   Alcohol use: Yes    Alcohol/week: 7.0 standard drinks of alcohol    Types: 7 Glasses of wine per week    Comment: 4 drinks a week   Drug use: No   Sexual activity: Yes    Partners: Male  Other Topics Concern   Not on file  Social History Narrative   Married. Education: [employer]. Exercise: Yes.   Social Drivers of  Corporate investment banker Strain: Low Risk  (10/05/2023)   Overall Financial Resource Strain (CARDIA)    Difficulty of Paying Living Expenses: Not hard at all  Food Insecurity: No Food Insecurity (10/05/2023)   Hunger Vital Sign    Worried About Running Out of Food in the Last Year: Never true    Ran Out of Food in the Last Year: Never true  Transportation Needs: No Transportation Needs (10/05/2023)   PRAPARE - Administrator, Civil Service (Medical): No    Lack of Transportation (Non-Medical): No  Physical Activity: Sufficiently Active (10/05/2023)   Exercise Vital Sign    Days of Exercise per Week: 6 days    Minutes of Exercise per Session: 120 min  Stress: No Stress Concern Present (10/05/2023)   [employer] of Occupational Health - Occupational Stress Questionnaire    Feeling of Stress : Not at all  Social Connections: Socially Integrated (10/05/2023)   Social Connection and Isolation Panel [NHANES]    Frequency of Communication with Friends and Family: More than three times a week    Frequency of Social Gatherings with Friends and Family: More than three times a week    Attends Religious Services: 1 to 4 times per year    Active Member of [employer] or Organizations: Yes    Attends Banker Meetings: 1 to 4 times per year    Marital Status: Married    Tobacco Counseling Counseling given: Not Answered   Clinical Intake:  Pre-visit preparation completed: Yes  Pain : No/denies pain     Diabetes: No  How often do you need to have someone help you when you read instructions, pamphlets, or other written materials from your doctor or pharmacy?: 1 - Never  Interpreter Needed?: No  Information entered by :: Kieth Pelt LPN   Activities of Daily Living    10/05/2023    8:15 AM 10/04/2023    9:13 AM  In your present state of health, do you have any difficulty performing the following activities:  Hearing? 0 0  Vision? 0 0  Difficulty  concentrating or making decisions? 0 0  Walking or climbing stairs? 0 0  Dressing or bathing? 0 0  Doing errands, shopping? 0 0  Preparing Food and eating ? N N  Using the Toilet? N N  In the past six months, have you accidently leaked urine? N N  Do you have problems with loss of bowel control? N N  Managing your Medications? N N  Managing your Finances? N N  Housekeeping or managing your Housekeeping? N N    Patient Care Team: Tabori, Katherine E, MD as PCP - [facility] ([facility]  Service as Biomedical scientist (Audiology)  Indicate any recent Medical Services you may have received from other than [facility] providers in the past year (date may be approximate).     Assessment:   This is a routine wellness examination for Nicole Benson.  Hearing/Vision screen Hearing Screening - Comments:: No trouble hearing Vision Screening - Comments:: [facility]  Up to date   Goals Addressed             This Visit's Progress    Patient Stated   On track    Maintain current health     Patient Stated   On track    Continue current lifestyle     Patient Stated       Doing a back packing trip once a month       Depression Screen    10/05/2023    8:16 AM 02/24/2023   12:54 PM 02/04/2023    9:08 AM 12/01/2022    9:54 AM 10/01/2022    8:41 AM 05/14/2022    9:52 AM 04/28/2022    9:52 AM  PHQ 2/9 Scores  PHQ - 2 Score 0 0 0 0 0 0 0  PHQ- 9 Score 3 0 0 2 2 0 0    Fall Risk    10/05/2023    8:14 AM 10/04/2023    9:13 AM 02/24/2023   12:54 PM 01/05/2023    8:18 AM 12/01/2022    9:53 AM  Fall Risk   Falls in the past year? 1 1 1  0 0  Number falls in past yr: 0 0 0 0 0  Injury with Fall? 0 0 0 0 0  Risk for fall due to :   No Fall Risks No Fall Risks No Fall Risks  Follow up Falls evaluation completed;Education provided;Falls prevention discussed  Falls evaluation completed Falls evaluation completed Falls evaluation completed    MEDICARE RISK AT HOME: Medicare Risk  at Home Any stairs in or around the home?: No If so, are there any without handrails?: No Home free of loose throw rugs in walkways, pet beds, electrical cords, etc?: Yes Adequate lighting in your home to reduce risk of falls?: Yes Life alert?: No Use of a cane, walker or w/c?: No Grab bars in the bathroom?: No Shower chair or bench in shower?: Yes Elevated toilet seat or a handicapped toilet?: No  TIMED UP AND GO:  Was the test performed?  No    Cognitive Function:        10/05/2023    8:14 AM 10/01/2022    8:37 AM  6CIT Screen  What Year? 0 points 0 points  What month? 0 points 0 points  What time? 0 points 0 points  Count back from 20 0 points 0 points  Months in reverse 0 points 0 points  Repeat phrase 0 points 0 points  Total Score 0 points 0 points    Immunizations Immunization History  Administered Date(s) Administered   Fluad Quad(high Dose 65+) 02/14/2019, 03/18/2022   Influenza, High Dose Seasonal PF 04/05/2018   Influenza,inj,Quad PF,6+ Mos 04/29/2017   Influenza,inj,quad, With Preservative 03/15/2020   Influenza-Unspecified 03/05/2015, 04/23/2021   Moderna SARS-COV2 Booster Vaccination 04/29/2020, 12/17/2020   Moderna Sars-Covid-2 Vaccination 07/14/2019, 08/14/2019   Pfizer Covid-19 Vaccine Bivalent Booster 87yrs & up 04/23/2021   Pneumococcal Conjugate-13 12/10/2016   Pneumococcal Polysaccharide-23 02/14/2019   Td 09/27/2019   Td (Adult), 2 Lf Tetanus Toxid, Preservative Free 09/27/2019   Tdap 02/05/2012   Zoster Recombinant(Shingrix) 03/05/2020, 05/20/2020  Zoster, Live 07/18/2012    TDAP status: Up to date  Flu Vaccine status: Up to date  Pneumococcal vaccine status: Up to date  Covid-19 vaccine status: Information provided on how to obtain vaccines.   Qualifies for Shingles Vaccine? No   Zostavax completed Yes   Shingrix Completed?: Yes  Screening Tests Health Maintenance  Topic Date Due   COVID-19 Vaccine (6 - 2024-25 season)  02/14/2023   INFLUENZA VACCINE  01/14/2024   MAMMOGRAM  07/05/2024   Medicare Annual Wellness (AWV)  10/04/2024   Colonoscopy  02/19/2027   DTaP/Tdap/Td (4 - Td or Tdap) 09/26/2029   Pneumonia Vaccine 27+ Years old  Completed   DEXA SCAN  Completed   Hepatitis C Screening  Completed   Zoster Vaccines- Shingrix  Completed   HPV VACCINES  Aged Out   Meningococcal B Vaccine  Aged Out    Health Maintenance  Health Maintenance Due  Topic Date Due   COVID-19 Vaccine (6 - 2024-25 season) 02/14/2023    Colorectal cancer screening: Type of screening: Colonoscopy. Completed 2018. Repeat every 10 years  Mammogram status: Completed  . Repeat every year  Bone Density status: Completed 2024. Results reflect: Bone density results: OSTEOPENIA. Repeat every 5 years.  Lung Cancer Screening: (Low Dose CT Chest recommended if Age 61-80 years, 20 pack-year currently smoking OR have quit w/in 15years.) does not qualify.   Lung Cancer Screening Referral:   Additional Screening:  Hepatitis C Screening: does not qualify; Completed 2016  Vision Screening: Recommended annual ophthalmology exams for early detection of glaucoma and other disorders of the eye. Is the patient up to date with their annual eye exam?  Yes  Who is the provider or what is the name of the office in which the patient attends annual eye exams? [facility] If pt is not established with a provider, would they like to be referred to a provider to establish care? No .   Dental Screening: Recommended annual dental exams for proper oral hygiene   Community Resource Referral / Chronic Care Management: CRR required this visit?  No   CCM required this visit?  No     Plan:     I have personally reviewed and noted the following in the patient's chart:   Medical and social history Use of alcohol, tobacco or illicit drugs  Current medications and supplements including opioid prescriptions. Patient is not currently taking opioid  prescriptions. Functional ability and status Nutritional status Physical activity Advanced directives List of other physicians Hospitalizations, surgeries, and ER visits in previous 12 months Vitals Screenings to include cognitive, depression, and falls Referrals and appointments  In addition, I have reviewed and discussed with patient certain preventive protocols, quality metrics, and best practice recommendations. A written personalized care plan for preventive services as well as general preventive health recommendations were provided to patient.     Kieth Pelt, LPN   09/21/8117   After Visit Summary: (MyChart) Due to this being a telephonic visit, the after visit summary with patients personalized plan was offered to patient via MyChart   Nurse Notes:

## 2023-10-20 DIAGNOSIS — L720 Epidermal cyst: Secondary | ICD-10-CM | POA: Diagnosis not present

## 2023-10-20 DIAGNOSIS — R238 Other skin changes: Secondary | ICD-10-CM | POA: Diagnosis not present

## 2023-10-20 DIAGNOSIS — B078 Other viral warts: Secondary | ICD-10-CM | POA: Diagnosis not present

## 2023-10-22 DIAGNOSIS — M9905 Segmental and somatic dysfunction of pelvic region: Secondary | ICD-10-CM | POA: Diagnosis not present

## 2023-10-22 DIAGNOSIS — M9903 Segmental and somatic dysfunction of lumbar region: Secondary | ICD-10-CM | POA: Diagnosis not present

## 2023-10-22 DIAGNOSIS — M62838 Other muscle spasm: Secondary | ICD-10-CM | POA: Diagnosis not present

## 2023-10-22 DIAGNOSIS — M9906 Segmental and somatic dysfunction of lower extremity: Secondary | ICD-10-CM | POA: Diagnosis not present

## 2023-10-22 DIAGNOSIS — M9904 Segmental and somatic dysfunction of sacral region: Secondary | ICD-10-CM | POA: Diagnosis not present

## 2023-10-22 DIAGNOSIS — M9908 Segmental and somatic dysfunction of rib cage: Secondary | ICD-10-CM | POA: Diagnosis not present

## 2023-10-22 DIAGNOSIS — M546 Pain in thoracic spine: Secondary | ICD-10-CM | POA: Diagnosis not present

## 2023-10-27 DIAGNOSIS — M9905 Segmental and somatic dysfunction of pelvic region: Secondary | ICD-10-CM | POA: Diagnosis not present

## 2023-10-27 DIAGNOSIS — M9903 Segmental and somatic dysfunction of lumbar region: Secondary | ICD-10-CM | POA: Diagnosis not present

## 2023-10-27 DIAGNOSIS — M545 Low back pain, unspecified: Secondary | ICD-10-CM | POA: Diagnosis not present

## 2023-10-27 DIAGNOSIS — M9904 Segmental and somatic dysfunction of sacral region: Secondary | ICD-10-CM | POA: Diagnosis not present

## 2023-10-27 DIAGNOSIS — M546 Pain in thoracic spine: Secondary | ICD-10-CM | POA: Diagnosis not present

## 2023-10-27 DIAGNOSIS — M9906 Segmental and somatic dysfunction of lower extremity: Secondary | ICD-10-CM | POA: Diagnosis not present

## 2023-10-27 DIAGNOSIS — M9902 Segmental and somatic dysfunction of thoracic region: Secondary | ICD-10-CM | POA: Diagnosis not present

## 2023-10-27 DIAGNOSIS — M62838 Other muscle spasm: Secondary | ICD-10-CM | POA: Diagnosis not present

## 2023-11-24 DIAGNOSIS — M9905 Segmental and somatic dysfunction of pelvic region: Secondary | ICD-10-CM | POA: Diagnosis not present

## 2023-11-24 DIAGNOSIS — S76312A Strain of muscle, fascia and tendon of the posterior muscle group at thigh level, left thigh, initial encounter: Secondary | ICD-10-CM | POA: Diagnosis not present

## 2023-11-24 DIAGNOSIS — M9904 Segmental and somatic dysfunction of sacral region: Secondary | ICD-10-CM | POA: Diagnosis not present

## 2023-11-24 DIAGNOSIS — M9906 Segmental and somatic dysfunction of lower extremity: Secondary | ICD-10-CM | POA: Diagnosis not present

## 2023-11-24 DIAGNOSIS — M79652 Pain in left thigh: Secondary | ICD-10-CM | POA: Diagnosis not present

## 2024-01-18 DIAGNOSIS — M25562 Pain in left knee: Secondary | ICD-10-CM | POA: Diagnosis not present

## 2024-01-18 DIAGNOSIS — M62831 Muscle spasm of calf: Secondary | ICD-10-CM | POA: Diagnosis not present

## 2024-01-18 DIAGNOSIS — M25561 Pain in right knee: Secondary | ICD-10-CM | POA: Diagnosis not present

## 2024-01-18 DIAGNOSIS — M9903 Segmental and somatic dysfunction of lumbar region: Secondary | ICD-10-CM | POA: Diagnosis not present

## 2024-01-18 DIAGNOSIS — M9904 Segmental and somatic dysfunction of sacral region: Secondary | ICD-10-CM | POA: Diagnosis not present

## 2024-01-18 DIAGNOSIS — M9908 Segmental and somatic dysfunction of rib cage: Secondary | ICD-10-CM | POA: Diagnosis not present

## 2024-01-18 DIAGNOSIS — M9906 Segmental and somatic dysfunction of lower extremity: Secondary | ICD-10-CM | POA: Diagnosis not present

## 2024-01-18 DIAGNOSIS — M9905 Segmental and somatic dysfunction of pelvic region: Secondary | ICD-10-CM | POA: Diagnosis not present

## 2024-01-31 DIAGNOSIS — Z124 Encounter for screening for malignant neoplasm of cervix: Secondary | ICD-10-CM | POA: Diagnosis not present

## 2024-01-31 DIAGNOSIS — Z01419 Encounter for gynecological examination (general) (routine) without abnormal findings: Secondary | ICD-10-CM | POA: Diagnosis not present

## 2024-03-23 DIAGNOSIS — M9907 Segmental and somatic dysfunction of upper extremity: Secondary | ICD-10-CM | POA: Diagnosis not present

## 2024-03-23 DIAGNOSIS — M25511 Pain in right shoulder: Secondary | ICD-10-CM | POA: Diagnosis not present

## 2024-03-23 DIAGNOSIS — M24811 Other specific joint derangements of right shoulder, not elsewhere classified: Secondary | ICD-10-CM | POA: Diagnosis not present

## 2024-03-23 DIAGNOSIS — M9908 Segmental and somatic dysfunction of rib cage: Secondary | ICD-10-CM | POA: Diagnosis not present

## 2024-03-23 DIAGNOSIS — G8929 Other chronic pain: Secondary | ICD-10-CM | POA: Diagnosis not present

## 2024-03-23 DIAGNOSIS — M9901 Segmental and somatic dysfunction of cervical region: Secondary | ICD-10-CM | POA: Diagnosis not present

## 2024-03-23 DIAGNOSIS — M75111 Incomplete rotator cuff tear or rupture of right shoulder, not specified as traumatic: Secondary | ICD-10-CM | POA: Diagnosis not present

## 2024-04-04 DIAGNOSIS — M9908 Segmental and somatic dysfunction of rib cage: Secondary | ICD-10-CM | POA: Diagnosis not present

## 2024-04-04 DIAGNOSIS — M9901 Segmental and somatic dysfunction of cervical region: Secondary | ICD-10-CM | POA: Diagnosis not present

## 2024-04-04 DIAGNOSIS — G8929 Other chronic pain: Secondary | ICD-10-CM | POA: Diagnosis not present

## 2024-04-04 DIAGNOSIS — M25511 Pain in right shoulder: Secondary | ICD-10-CM | POA: Diagnosis not present

## 2024-04-04 DIAGNOSIS — M9907 Segmental and somatic dysfunction of upper extremity: Secondary | ICD-10-CM | POA: Diagnosis not present

## 2024-10-17 ENCOUNTER — Encounter
